# Patient Record
Sex: Male | Born: 1935 | Race: White | Hispanic: No | Smoking: Never smoker
Health system: Southern US, Community
[De-identification: ages and names within clinical notes are randomized; demographics above are authoritative.]

## PROBLEM LIST (undated history)

## (undated) DIAGNOSIS — R0602 Shortness of breath: Secondary | ICD-10-CM

## (undated) DIAGNOSIS — F329 Major depressive disorder, single episode, unspecified: Secondary | ICD-10-CM

## (undated) DIAGNOSIS — K5730 Diverticulosis of large intestine without perforation or abscess without bleeding: Secondary | ICD-10-CM

## (undated) DIAGNOSIS — I639 Cerebral infarction, unspecified: Secondary | ICD-10-CM

## (undated) DIAGNOSIS — E785 Hyperlipidemia, unspecified: Secondary | ICD-10-CM

## (undated) DIAGNOSIS — I679 Cerebrovascular disease, unspecified: Secondary | ICD-10-CM

## (undated) DIAGNOSIS — I480 Paroxysmal atrial fibrillation: Secondary | ICD-10-CM

## (undated) DIAGNOSIS — Z8619 Personal history of other infectious and parasitic diseases: Secondary | ICD-10-CM

## (undated) DIAGNOSIS — C4490 Unspecified malignant neoplasm of skin, unspecified: Secondary | ICD-10-CM

## (undated) DIAGNOSIS — I82409 Acute embolism and thrombosis of unspecified deep veins of unspecified lower extremity: Secondary | ICD-10-CM

## (undated) DIAGNOSIS — I4510 Unspecified right bundle-branch block: Secondary | ICD-10-CM

## (undated) DIAGNOSIS — I2699 Other pulmonary embolism without acute cor pulmonale: Secondary | ICD-10-CM

## (undated) DIAGNOSIS — J309 Allergic rhinitis, unspecified: Secondary | ICD-10-CM

## (undated) DIAGNOSIS — C61 Malignant neoplasm of prostate: Secondary | ICD-10-CM

## (undated) DIAGNOSIS — I350 Nonrheumatic aortic (valve) stenosis: Secondary | ICD-10-CM

## (undated) DIAGNOSIS — IMO0002 Reserved for concepts with insufficient information to code with codable children: Secondary | ICD-10-CM

## (undated) DIAGNOSIS — M1990 Unspecified osteoarthritis, unspecified site: Secondary | ICD-10-CM

## (undated) DIAGNOSIS — R011 Cardiac murmur, unspecified: Secondary | ICD-10-CM

## (undated) DIAGNOSIS — D126 Benign neoplasm of colon, unspecified: Secondary | ICD-10-CM

## (undated) DIAGNOSIS — J42 Unspecified chronic bronchitis: Secondary | ICD-10-CM

## (undated) DIAGNOSIS — I10 Essential (primary) hypertension: Secondary | ICD-10-CM

## (undated) DIAGNOSIS — F32A Depression, unspecified: Secondary | ICD-10-CM

## (undated) DIAGNOSIS — K589 Irritable bowel syndrome without diarrhea: Secondary | ICD-10-CM

## (undated) DIAGNOSIS — I872 Venous insufficiency (chronic) (peripheral): Secondary | ICD-10-CM

## (undated) DIAGNOSIS — J189 Pneumonia, unspecified organism: Secondary | ICD-10-CM

## (undated) DIAGNOSIS — G609 Hereditary and idiopathic neuropathy, unspecified: Secondary | ICD-10-CM

## (undated) DIAGNOSIS — K219 Gastro-esophageal reflux disease without esophagitis: Secondary | ICD-10-CM

## (undated) DIAGNOSIS — I739 Peripheral vascular disease, unspecified: Secondary | ICD-10-CM

## (undated) DIAGNOSIS — I6350 Cerebral infarction due to unspecified occlusion or stenosis of unspecified cerebral artery: Secondary | ICD-10-CM

## (undated) HISTORY — DX: Essential (primary) hypertension: I10

## (undated) HISTORY — PX: VENA CAVA FILTER PLACEMENT: SHX1085

## (undated) HISTORY — PX: CAROTID ENDARTERECTOMY: SUR193

## (undated) HISTORY — DX: Malignant neoplasm of prostate: C61

## (undated) HISTORY — DX: Hereditary and idiopathic neuropathy, unspecified: G60.9

## (undated) HISTORY — DX: Benign neoplasm of colon, unspecified: D12.6

## (undated) HISTORY — DX: Venous insufficiency (chronic) (peripheral): I87.2

## (undated) HISTORY — PX: SKIN CANCER EXCISION: SHX779

## (undated) HISTORY — PX: ABDOMINAL AORTIC ANEURYSM REPAIR: SUR1152

## (undated) HISTORY — DX: Nonrheumatic aortic (valve) stenosis: I35.0

## (undated) HISTORY — DX: Allergic rhinitis, unspecified: J30.9

## (undated) HISTORY — DX: Hyperlipidemia, unspecified: E78.5

## (undated) HISTORY — DX: Other pulmonary embolism without acute cor pulmonale: I26.99

## (undated) HISTORY — DX: Peripheral vascular disease, unspecified: I73.9

## (undated) HISTORY — DX: Irritable bowel syndrome, unspecified: K58.9

## (undated) HISTORY — DX: Cerebrovascular disease, unspecified: I67.9

## (undated) HISTORY — DX: Major depressive disorder, single episode, unspecified: F32.9

## (undated) HISTORY — DX: Paroxysmal atrial fibrillation: I48.0

## (undated) HISTORY — DX: Gastro-esophageal reflux disease without esophagitis: K21.9

## (undated) HISTORY — DX: Cerebral infarction due to unspecified occlusion or stenosis of unspecified cerebral artery: I63.50

## (undated) HISTORY — DX: Acute embolism and thrombosis of unspecified deep veins of unspecified lower extremity: I82.409

## (undated) HISTORY — DX: Diverticulosis of large intestine without perforation or abscess without bleeding: K57.30

## (undated) HISTORY — PX: CATARACT EXTRACTION W/ INTRAOCULAR LENS  IMPLANT, BILATERAL: SHX1307

## (undated) HISTORY — DX: Depression, unspecified: F32.A

## (undated) HISTORY — DX: Unspecified chronic bronchitis: J42

## (undated) HISTORY — DX: Unspecified osteoarthritis, unspecified site: M19.90

## (undated) HISTORY — PX: TONSILLECTOMY AND ADENOIDECTOMY: SUR1326

## (undated) HISTORY — PX: PROSTATECTOMY: SHX69

---
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
## 1989-02-07 HISTORY — PX: INGUINAL HERNIA REPAIR: SUR1180

## 1997-12-08 ENCOUNTER — Encounter: Payer: Self-pay | Admitting: *Deleted

## 1997-12-08 ENCOUNTER — Ambulatory Visit (HOSPITAL_COMMUNITY): Admission: RE | Admit: 1997-12-08 | Discharge: 1997-12-08 | Payer: Self-pay | Admitting: *Deleted

## 1997-12-17 ENCOUNTER — Encounter: Payer: Self-pay | Admitting: *Deleted

## 1997-12-17 ENCOUNTER — Inpatient Hospital Stay (HOSPITAL_COMMUNITY): Admission: AD | Admit: 1997-12-17 | Discharge: 1997-12-27 | Payer: Self-pay | Admitting: *Deleted

## 1997-12-19 ENCOUNTER — Encounter: Payer: Self-pay | Admitting: Pulmonary Disease

## 1998-10-01 ENCOUNTER — Encounter: Payer: Self-pay | Admitting: Vascular Surgery

## 1998-10-01 ENCOUNTER — Ambulatory Visit (HOSPITAL_COMMUNITY): Admission: RE | Admit: 1998-10-01 | Discharge: 1998-10-01 | Payer: Self-pay | Admitting: Vascular Surgery

## 1998-10-08 ENCOUNTER — Inpatient Hospital Stay (HOSPITAL_COMMUNITY): Admission: RE | Admit: 1998-10-08 | Discharge: 1998-10-24 | Payer: Self-pay | Admitting: Vascular Surgery

## 1998-10-08 ENCOUNTER — Encounter: Payer: Self-pay | Admitting: Vascular Surgery

## 1998-10-08 ENCOUNTER — Encounter (INDEPENDENT_AMBULATORY_CARE_PROVIDER_SITE_OTHER): Payer: Self-pay | Admitting: Specialist

## 1998-10-08 ENCOUNTER — Encounter: Payer: Self-pay | Admitting: Anesthesiology

## 1998-10-09 ENCOUNTER — Encounter: Payer: Self-pay | Admitting: Pulmonary Disease

## 1998-10-09 ENCOUNTER — Encounter: Payer: Self-pay | Admitting: Vascular Surgery

## 1998-10-10 ENCOUNTER — Encounter: Payer: Self-pay | Admitting: Thoracic Surgery

## 1998-10-11 ENCOUNTER — Encounter: Payer: Self-pay | Admitting: Thoracic Surgery

## 1998-10-12 ENCOUNTER — Encounter: Payer: Self-pay | Admitting: Thoracic Surgery

## 1998-10-13 ENCOUNTER — Encounter: Payer: Self-pay | Admitting: Thoracic Surgery

## 1998-10-14 ENCOUNTER — Encounter: Payer: Self-pay | Admitting: Vascular Surgery

## 1998-10-15 ENCOUNTER — Encounter: Payer: Self-pay | Admitting: Pulmonary Disease

## 1998-10-16 ENCOUNTER — Encounter: Payer: Self-pay | Admitting: Pulmonary Disease

## 1998-10-17 ENCOUNTER — Encounter: Payer: Self-pay | Admitting: Vascular Surgery

## 1998-10-20 ENCOUNTER — Encounter: Payer: Self-pay | Admitting: Vascular Surgery

## 1998-10-22 ENCOUNTER — Encounter: Payer: Self-pay | Admitting: Vascular Surgery

## 1998-10-23 ENCOUNTER — Encounter: Payer: Self-pay | Admitting: Vascular Surgery

## 1999-07-19 ENCOUNTER — Encounter: Payer: Self-pay | Admitting: Vascular Surgery

## 1999-07-19 ENCOUNTER — Ambulatory Visit (HOSPITAL_COMMUNITY): Admission: RE | Admit: 1999-07-19 | Discharge: 1999-07-19 | Payer: Self-pay | Admitting: Vascular Surgery

## 1999-08-24 ENCOUNTER — Encounter: Payer: Self-pay | Admitting: Vascular Surgery

## 1999-08-25 ENCOUNTER — Encounter (INDEPENDENT_AMBULATORY_CARE_PROVIDER_SITE_OTHER): Payer: Self-pay

## 1999-08-25 ENCOUNTER — Inpatient Hospital Stay: Admission: RE | Admit: 1999-08-25 | Discharge: 1999-08-26 | Payer: Self-pay | Admitting: Vascular Surgery

## 2000-01-04 ENCOUNTER — Emergency Department (HOSPITAL_COMMUNITY): Admission: EM | Admit: 2000-01-04 | Discharge: 2000-01-05 | Payer: Self-pay | Admitting: Emergency Medicine

## 2000-07-16 ENCOUNTER — Encounter: Payer: Self-pay | Admitting: Emergency Medicine

## 2000-07-16 ENCOUNTER — Emergency Department (HOSPITAL_COMMUNITY): Admission: EM | Admit: 2000-07-16 | Discharge: 2000-07-16 | Payer: Self-pay | Admitting: Emergency Medicine

## 2001-01-25 ENCOUNTER — Encounter: Payer: Self-pay | Admitting: Pulmonary Disease

## 2001-01-25 ENCOUNTER — Ambulatory Visit (HOSPITAL_COMMUNITY): Admission: RE | Admit: 2001-01-25 | Discharge: 2001-01-25 | Payer: Self-pay | Admitting: Pulmonary Disease

## 2001-04-16 ENCOUNTER — Encounter: Admission: RE | Admit: 2001-04-16 | Discharge: 2001-05-10 | Payer: Self-pay | Admitting: Neurological Surgery

## 2002-03-14 ENCOUNTER — Encounter: Payer: Self-pay | Admitting: Pulmonary Disease

## 2002-03-14 ENCOUNTER — Encounter: Payer: Self-pay | Admitting: Emergency Medicine

## 2002-03-14 ENCOUNTER — Inpatient Hospital Stay (HOSPITAL_COMMUNITY): Admission: EM | Admit: 2002-03-14 | Discharge: 2002-03-18 | Payer: Self-pay | Admitting: Emergency Medicine

## 2002-04-08 ENCOUNTER — Encounter: Payer: Self-pay | Admitting: Ophthalmology

## 2002-04-09 ENCOUNTER — Ambulatory Visit (HOSPITAL_COMMUNITY): Admission: RE | Admit: 2002-04-09 | Discharge: 2002-04-09 | Payer: Self-pay | Admitting: Ophthalmology

## 2002-06-14 ENCOUNTER — Ambulatory Visit (HOSPITAL_COMMUNITY): Admission: RE | Admit: 2002-06-14 | Discharge: 2002-06-14 | Payer: Self-pay | Admitting: Ophthalmology

## 2002-09-08 ENCOUNTER — Encounter: Payer: Self-pay | Admitting: Neurology

## 2002-09-08 ENCOUNTER — Inpatient Hospital Stay (HOSPITAL_COMMUNITY): Admission: EM | Admit: 2002-09-08 | Discharge: 2002-09-10 | Payer: Self-pay

## 2002-09-08 DIAGNOSIS — I639 Cerebral infarction, unspecified: Secondary | ICD-10-CM

## 2002-09-08 HISTORY — DX: Cerebral infarction, unspecified: I63.9

## 2002-09-09 ENCOUNTER — Encounter: Payer: Self-pay | Admitting: Internal Medicine

## 2002-09-10 ENCOUNTER — Inpatient Hospital Stay (HOSPITAL_COMMUNITY)
Admission: RE | Admit: 2002-09-10 | Discharge: 2002-09-24 | Payer: Self-pay | Admitting: Physical Medicine & Rehabilitation

## 2002-09-17 ENCOUNTER — Encounter: Payer: Self-pay | Admitting: Pulmonary Disease

## 2002-09-18 ENCOUNTER — Encounter: Payer: Self-pay | Admitting: Physical Medicine & Rehabilitation

## 2002-09-23 ENCOUNTER — Encounter: Payer: Self-pay | Admitting: Physical Medicine & Rehabilitation

## 2002-10-09 DIAGNOSIS — I679 Cerebrovascular disease, unspecified: Secondary | ICD-10-CM

## 2002-10-09 HISTORY — DX: Cerebrovascular disease, unspecified: I67.9

## 2002-10-23 ENCOUNTER — Ambulatory Visit
Admission: RE | Admit: 2002-10-23 | Discharge: 2002-10-23 | Payer: Self-pay | Admitting: Physical Medicine & Rehabilitation

## 2002-10-23 ENCOUNTER — Encounter: Payer: Self-pay | Admitting: Physical Medicine & Rehabilitation

## 2002-10-23 ENCOUNTER — Ambulatory Visit (HOSPITAL_COMMUNITY)
Admission: RE | Admit: 2002-10-23 | Discharge: 2002-10-23 | Payer: Self-pay | Admitting: Physical Medicine & Rehabilitation

## 2002-10-29 ENCOUNTER — Encounter
Admission: RE | Admit: 2002-10-29 | Discharge: 2003-01-27 | Payer: Self-pay | Admitting: Physical Medicine & Rehabilitation

## 2002-11-01 ENCOUNTER — Encounter
Admission: RE | Admit: 2002-11-01 | Discharge: 2002-12-27 | Payer: Self-pay | Admitting: Physical Medicine & Rehabilitation

## 2002-11-20 ENCOUNTER — Encounter: Payer: Self-pay | Admitting: Physical Medicine & Rehabilitation

## 2002-11-20 ENCOUNTER — Ambulatory Visit (HOSPITAL_COMMUNITY)
Admission: RE | Admit: 2002-11-20 | Discharge: 2002-11-20 | Payer: Self-pay | Admitting: Physical Medicine & Rehabilitation

## 2003-01-22 ENCOUNTER — Ambulatory Visit (HOSPITAL_COMMUNITY)
Admission: RE | Admit: 2003-01-22 | Discharge: 2003-01-22 | Payer: Self-pay | Admitting: Physical Medicine & Rehabilitation

## 2003-03-04 ENCOUNTER — Encounter
Admission: RE | Admit: 2003-03-04 | Discharge: 2003-06-02 | Payer: Self-pay | Admitting: Physical Medicine & Rehabilitation

## 2003-11-14 ENCOUNTER — Emergency Department (HOSPITAL_COMMUNITY): Admission: EM | Admit: 2003-11-14 | Discharge: 2003-11-14 | Payer: Self-pay | Admitting: Emergency Medicine

## 2003-12-12 ENCOUNTER — Ambulatory Visit: Payer: Self-pay | Admitting: Internal Medicine

## 2003-12-17 ENCOUNTER — Ambulatory Visit: Payer: Self-pay | Admitting: Pulmonary Disease

## 2004-01-02 ENCOUNTER — Ambulatory Visit: Payer: Self-pay | Admitting: Internal Medicine

## 2004-01-16 ENCOUNTER — Ambulatory Visit: Payer: Self-pay | Admitting: Internal Medicine

## 2004-01-16 ENCOUNTER — Ambulatory Visit: Payer: Self-pay | Admitting: Pulmonary Disease

## 2004-02-06 ENCOUNTER — Ambulatory Visit: Payer: Self-pay | Admitting: Internal Medicine

## 2004-03-05 ENCOUNTER — Ambulatory Visit: Payer: Self-pay | Admitting: Cardiology

## 2004-04-02 ENCOUNTER — Ambulatory Visit: Payer: Self-pay | Admitting: Cardiology

## 2004-04-30 ENCOUNTER — Ambulatory Visit: Payer: Self-pay | Admitting: Cardiology

## 2004-05-17 ENCOUNTER — Ambulatory Visit: Payer: Self-pay | Admitting: Pulmonary Disease

## 2004-05-18 ENCOUNTER — Ambulatory Visit: Payer: Self-pay | Admitting: Pulmonary Disease

## 2004-05-28 ENCOUNTER — Ambulatory Visit: Payer: Self-pay | Admitting: Cardiology

## 2004-06-08 ENCOUNTER — Ambulatory Visit: Payer: Self-pay | Admitting: Cardiovascular Disease

## 2004-06-16 ENCOUNTER — Ambulatory Visit: Payer: Self-pay | Admitting: Cardiovascular Disease

## 2004-06-16 ENCOUNTER — Ambulatory Visit: Payer: Self-pay

## 2004-06-25 ENCOUNTER — Ambulatory Visit: Payer: Self-pay | Admitting: Cardiovascular Disease

## 2004-07-23 ENCOUNTER — Ambulatory Visit: Payer: Self-pay | Admitting: Cardiology

## 2004-08-20 ENCOUNTER — Ambulatory Visit: Payer: Self-pay | Admitting: Cardiology

## 2004-09-14 ENCOUNTER — Ambulatory Visit: Payer: Self-pay | Admitting: Pulmonary Disease

## 2004-09-17 ENCOUNTER — Ambulatory Visit: Payer: Self-pay | Admitting: Cardiology

## 2004-10-01 ENCOUNTER — Ambulatory Visit: Payer: Self-pay | Admitting: Internal Medicine

## 2004-10-29 ENCOUNTER — Ambulatory Visit: Payer: Self-pay | Admitting: Cardiology

## 2004-11-29 ENCOUNTER — Ambulatory Visit: Payer: Self-pay | Admitting: Cardiology

## 2004-12-13 ENCOUNTER — Ambulatory Visit: Payer: Self-pay | Admitting: Internal Medicine

## 2005-01-07 ENCOUNTER — Ambulatory Visit: Payer: Self-pay | Admitting: Cardiovascular Disease

## 2005-01-12 ENCOUNTER — Ambulatory Visit: Payer: Self-pay | Admitting: Pulmonary Disease

## 2005-02-09 ENCOUNTER — Ambulatory Visit: Payer: Self-pay | Admitting: Cardiology

## 2005-03-01 ENCOUNTER — Ambulatory Visit: Payer: Self-pay | Admitting: *Deleted

## 2005-03-22 ENCOUNTER — Ambulatory Visit: Payer: Self-pay | Admitting: *Deleted

## 2005-04-19 ENCOUNTER — Ambulatory Visit: Payer: Self-pay | Admitting: *Deleted

## 2005-05-03 ENCOUNTER — Ambulatory Visit: Payer: Self-pay | Admitting: Cardiology

## 2005-05-24 ENCOUNTER — Ambulatory Visit: Payer: Self-pay | Admitting: Cardiology

## 2005-06-21 ENCOUNTER — Ambulatory Visit: Payer: Self-pay | Admitting: Cardiology

## 2005-07-13 ENCOUNTER — Ambulatory Visit: Payer: Self-pay | Admitting: Pulmonary Disease

## 2005-07-19 ENCOUNTER — Ambulatory Visit: Payer: Self-pay | Admitting: *Deleted

## 2005-08-16 ENCOUNTER — Ambulatory Visit: Payer: Self-pay | Admitting: *Deleted

## 2005-08-24 ENCOUNTER — Ambulatory Visit: Payer: Self-pay | Admitting: Pulmonary Disease

## 2005-09-13 ENCOUNTER — Ambulatory Visit: Payer: Self-pay | Admitting: Cardiology

## 2005-10-11 ENCOUNTER — Ambulatory Visit: Payer: Self-pay | Admitting: Cardiovascular Disease

## 2005-11-07 ENCOUNTER — Ambulatory Visit: Payer: Self-pay | Admitting: Cardiology

## 2005-11-29 ENCOUNTER — Ambulatory Visit: Payer: Self-pay | Admitting: Pulmonary Disease

## 2005-12-05 ENCOUNTER — Ambulatory Visit: Payer: Self-pay | Admitting: Cardiology

## 2006-01-02 ENCOUNTER — Ambulatory Visit: Payer: Self-pay | Admitting: Pulmonary Disease

## 2006-01-03 ENCOUNTER — Ambulatory Visit: Payer: Self-pay | Admitting: Cardiology

## 2006-02-02 ENCOUNTER — Ambulatory Visit: Payer: Self-pay | Admitting: Cardiology

## 2006-02-09 ENCOUNTER — Ambulatory Visit: Payer: Self-pay | Admitting: Cardiovascular Disease

## 2006-03-02 ENCOUNTER — Ambulatory Visit: Payer: Self-pay | Admitting: Cardiology

## 2006-03-23 ENCOUNTER — Ambulatory Visit: Payer: Self-pay | Admitting: Cardiology

## 2006-04-20 ENCOUNTER — Ambulatory Visit: Payer: Self-pay | Admitting: Cardiology

## 2006-05-01 ENCOUNTER — Ambulatory Visit: Payer: Self-pay | Admitting: Cardiology

## 2006-05-22 ENCOUNTER — Ambulatory Visit: Payer: Self-pay | Admitting: Cardiology

## 2006-06-12 ENCOUNTER — Ambulatory Visit: Payer: Self-pay | Admitting: Cardiovascular Disease

## 2006-06-16 ENCOUNTER — Ambulatory Visit: Payer: Self-pay

## 2006-06-26 ENCOUNTER — Ambulatory Visit: Payer: Self-pay | Admitting: Pulmonary Disease

## 2006-06-27 LAB — CONVERTED CEMR LAB
AST: 33 units/L (ref 0–37)
Alkaline Phosphatase: 120 units/L — ABNORMAL HIGH (ref 39–117)
BUN: 24 mg/dL — ABNORMAL HIGH (ref 6–23)
Basophils Relative: 1.3 % — ABNORMAL HIGH (ref 0.0–1.0)
CO2: 28 meq/L (ref 19–32)
Chloride: 106 meq/L (ref 96–112)
Creatinine, Ser: 1 mg/dL (ref 0.4–1.5)
HCT: 50.4 % (ref 39.0–52.0)
Hemoglobin: 17.2 g/dL — ABNORMAL HIGH (ref 13.0–17.0)
LDL Cholesterol: 65 mg/dL (ref 0–99)
Monocytes Absolute: 1.4 10*3/uL — ABNORMAL HIGH (ref 0.2–0.7)
Monocytes Relative: 13.6 % — ABNORMAL HIGH (ref 3.0–11.0)
Neutrophils Relative %: 44.6 % (ref 43.0–77.0)
Potassium: 4.8 meq/L (ref 3.5–5.1)
RDW: 13.4 % (ref 11.5–14.6)
TSH: 3.14 microintl units/mL (ref 0.35–5.50)
Total Bilirubin: 1.1 mg/dL (ref 0.3–1.2)
Total Protein: 6.1 g/dL (ref 6.0–8.3)
VLDL: 20 mg/dL (ref 0–40)

## 2006-07-10 ENCOUNTER — Ambulatory Visit: Payer: Self-pay | Admitting: Cardiovascular Disease

## 2006-07-31 ENCOUNTER — Ambulatory Visit: Payer: Self-pay | Admitting: Cardiovascular Disease

## 2006-08-17 ENCOUNTER — Ambulatory Visit: Payer: Self-pay | Admitting: Cardiovascular Disease

## 2006-08-28 ENCOUNTER — Ambulatory Visit: Payer: Self-pay | Admitting: Cardiology

## 2006-09-25 ENCOUNTER — Ambulatory Visit: Payer: Self-pay | Admitting: Cardiology

## 2006-10-11 ENCOUNTER — Ambulatory Visit: Payer: Self-pay | Admitting: Internal Medicine

## 2006-10-31 ENCOUNTER — Ambulatory Visit: Payer: Self-pay | Admitting: Cardiology

## 2006-11-28 ENCOUNTER — Ambulatory Visit: Payer: Self-pay | Admitting: Cardiovascular Disease

## 2006-12-25 ENCOUNTER — Ambulatory Visit: Payer: Self-pay | Admitting: Pulmonary Disease

## 2006-12-26 ENCOUNTER — Ambulatory Visit: Payer: Self-pay | Admitting: Cardiology

## 2007-01-23 ENCOUNTER — Ambulatory Visit: Payer: Self-pay | Admitting: Internal Medicine

## 2007-02-09 ENCOUNTER — Telehealth (INDEPENDENT_AMBULATORY_CARE_PROVIDER_SITE_OTHER): Payer: Self-pay | Admitting: *Deleted

## 2007-02-12 DIAGNOSIS — M1990 Unspecified osteoarthritis, unspecified site: Secondary | ICD-10-CM | POA: Insufficient documentation

## 2007-02-12 DIAGNOSIS — I10 Essential (primary) hypertension: Secondary | ICD-10-CM | POA: Insufficient documentation

## 2007-02-12 DIAGNOSIS — C61 Malignant neoplasm of prostate: Secondary | ICD-10-CM

## 2007-02-12 DIAGNOSIS — E785 Hyperlipidemia, unspecified: Secondary | ICD-10-CM | POA: Insufficient documentation

## 2007-02-12 DIAGNOSIS — K219 Gastro-esophageal reflux disease without esophagitis: Secondary | ICD-10-CM

## 2007-02-20 ENCOUNTER — Ambulatory Visit: Payer: Self-pay | Admitting: Cardiovascular Disease

## 2007-03-04 ENCOUNTER — Emergency Department (HOSPITAL_COMMUNITY): Admission: EM | Admit: 2007-03-04 | Discharge: 2007-03-04 | Payer: Self-pay | Admitting: Emergency Medicine

## 2007-03-20 ENCOUNTER — Ambulatory Visit: Payer: Self-pay | Admitting: Cardiovascular Disease

## 2007-03-20 LAB — CONVERTED CEMR LAB
CO2: 30 meq/L (ref 19–32)
Creatinine, Ser: 1 mg/dL (ref 0.4–1.5)
Glucose, Bld: 100 mg/dL — ABNORMAL HIGH (ref 70–99)
Potassium: 4.8 meq/L (ref 3.5–5.1)
Sodium: 140 meq/L (ref 135–145)

## 2007-04-17 ENCOUNTER — Ambulatory Visit: Payer: Self-pay | Admitting: Cardiology

## 2007-05-14 ENCOUNTER — Ambulatory Visit: Payer: Self-pay | Admitting: Cardiovascular Disease

## 2007-06-12 ENCOUNTER — Ambulatory Visit: Payer: Self-pay

## 2007-06-12 ENCOUNTER — Encounter: Payer: Self-pay | Admitting: Pulmonary Disease

## 2007-06-12 ENCOUNTER — Ambulatory Visit: Payer: Self-pay | Admitting: Cardiology

## 2007-06-26 ENCOUNTER — Ambulatory Visit: Payer: Self-pay | Admitting: Pulmonary Disease

## 2007-06-26 DIAGNOSIS — I679 Cerebrovascular disease, unspecified: Secondary | ICD-10-CM

## 2007-06-26 DIAGNOSIS — G609 Hereditary and idiopathic neuropathy, unspecified: Secondary | ICD-10-CM | POA: Insufficient documentation

## 2007-06-26 DIAGNOSIS — I2699 Other pulmonary embolism without acute cor pulmonale: Secondary | ICD-10-CM

## 2007-06-26 DIAGNOSIS — I6350 Cerebral infarction due to unspecified occlusion or stenosis of unspecified cerebral artery: Secondary | ICD-10-CM | POA: Insufficient documentation

## 2007-07-01 DIAGNOSIS — K5730 Diverticulosis of large intestine without perforation or abscess without bleeding: Secondary | ICD-10-CM | POA: Insufficient documentation

## 2007-07-01 DIAGNOSIS — I739 Peripheral vascular disease, unspecified: Secondary | ICD-10-CM

## 2007-07-01 DIAGNOSIS — J309 Allergic rhinitis, unspecified: Secondary | ICD-10-CM | POA: Insufficient documentation

## 2007-07-01 DIAGNOSIS — I872 Venous insufficiency (chronic) (peripheral): Secondary | ICD-10-CM | POA: Insufficient documentation

## 2007-07-01 DIAGNOSIS — D126 Benign neoplasm of colon, unspecified: Secondary | ICD-10-CM

## 2007-07-01 LAB — CONVERTED CEMR LAB
ALT: 28 units/L (ref 0–53)
AST: 31 units/L (ref 0–37)
Basophils Relative: 0.6 % (ref 0.0–1.0)
CO2: 29 meq/L (ref 19–32)
Calcium: 9.6 mg/dL (ref 8.4–10.5)
Chloride: 107 meq/L (ref 96–112)
Cholesterol: 120 mg/dL (ref 0–200)
Creatinine, Ser: 1.1 mg/dL (ref 0.4–1.5)
Eosinophils Relative: 2 % (ref 0.0–5.0)
Glucose, Bld: 137 mg/dL — ABNORMAL HIGH (ref 70–99)
Hemoglobin: 16.7 g/dL (ref 13.0–17.0)
LDL Cholesterol: 66 mg/dL (ref 0–99)
Lymphocytes Relative: 39.8 % (ref 12.0–46.0)
Monocytes Relative: 10 % (ref 3.0–12.0)
Neutro Abs: 4.9 10*3/uL (ref 1.4–7.7)
Neutrophils Relative %: 47.6 % (ref 43.0–77.0)
PSA: 0.02 ng/mL — ABNORMAL LOW (ref 0.10–4.00)
RBC: 5.25 M/uL (ref 4.22–5.81)
TSH: 3 microintl units/mL (ref 0.35–5.50)
Total Bilirubin: 1.5 mg/dL — ABNORMAL HIGH (ref 0.3–1.2)
Total CHOL/HDL Ratio: 3.4
Total Protein: 6.2 g/dL (ref 6.0–8.3)
VLDL: 19 mg/dL (ref 0–40)
WBC: 10.5 10*3/uL (ref 4.5–10.5)

## 2007-07-10 ENCOUNTER — Ambulatory Visit: Payer: Self-pay | Admitting: Cardiology

## 2007-08-07 ENCOUNTER — Ambulatory Visit: Payer: Self-pay | Admitting: Cardiology

## 2007-08-28 ENCOUNTER — Telehealth (INDEPENDENT_AMBULATORY_CARE_PROVIDER_SITE_OTHER): Payer: Self-pay | Admitting: *Deleted

## 2007-09-04 ENCOUNTER — Ambulatory Visit: Payer: Self-pay | Admitting: Internal Medicine

## 2007-09-28 ENCOUNTER — Ambulatory Visit: Payer: Self-pay | Admitting: Internal Medicine

## 2007-09-28 ENCOUNTER — Telehealth: Payer: Self-pay | Admitting: Pulmonary Disease

## 2007-09-28 LAB — CONVERTED CEMR LAB
Basophils Absolute: 0.1 10*3/uL (ref 0.0–0.1)
Calcium: 9.5 mg/dL (ref 8.4–10.5)
GFR calc Af Amer: 82 mL/min
GFR calc non Af Amer: 68 mL/min
Glucose, Bld: 110 mg/dL — ABNORMAL HIGH (ref 70–99)
HCT: 49.1 % (ref 39.0–52.0)
Hemoglobin: 16.7 g/dL (ref 13.0–17.0)
MCHC: 34.1 g/dL (ref 30.0–36.0)
Monocytes Absolute: 1.9 10*3/uL — ABNORMAL HIGH (ref 0.1–1.0)
Monocytes Relative: 11.5 % (ref 3.0–12.0)
Neutro Abs: 11.4 10*3/uL — ABNORMAL HIGH (ref 1.4–7.7)
Platelets: 158 10*3/uL (ref 150–400)
Potassium: 4.1 meq/L (ref 3.5–5.1)
Pro B Natriuretic peptide (BNP): 349 pg/mL — ABNORMAL HIGH (ref 0.0–100.0)
RDW: 12.8 % (ref 11.5–14.6)
Sodium: 140 meq/L (ref 135–145)
Uric Acid, Serum: 6.4 mg/dL (ref 4.0–7.8)

## 2007-10-02 ENCOUNTER — Ambulatory Visit: Payer: Self-pay | Admitting: Cardiology

## 2007-10-04 ENCOUNTER — Telehealth: Payer: Self-pay | Admitting: Adult Health

## 2007-10-05 ENCOUNTER — Ambulatory Visit: Payer: Self-pay | Admitting: Pulmonary Disease

## 2007-10-05 ENCOUNTER — Ambulatory Visit: Payer: Self-pay

## 2007-10-09 ENCOUNTER — Ambulatory Visit (HOSPITAL_COMMUNITY): Admission: RE | Admit: 2007-10-09 | Discharge: 2007-10-09 | Payer: Self-pay | Admitting: Pulmonary Disease

## 2007-10-19 ENCOUNTER — Ambulatory Visit: Payer: Self-pay | Admitting: Internal Medicine

## 2007-10-30 ENCOUNTER — Ambulatory Visit: Payer: Self-pay | Admitting: Cardiology

## 2007-11-13 ENCOUNTER — Ambulatory Visit: Payer: Self-pay | Admitting: Cardiovascular Disease

## 2007-12-10 ENCOUNTER — Telehealth: Payer: Self-pay | Admitting: Pulmonary Disease

## 2007-12-11 ENCOUNTER — Ambulatory Visit: Payer: Self-pay | Admitting: Internal Medicine

## 2007-12-27 ENCOUNTER — Ambulatory Visit: Payer: Self-pay | Admitting: Pulmonary Disease

## 2008-01-08 ENCOUNTER — Telehealth: Payer: Self-pay | Admitting: Pulmonary Disease

## 2008-01-08 ENCOUNTER — Ambulatory Visit: Payer: Self-pay | Admitting: Cardiovascular Disease

## 2008-01-15 ENCOUNTER — Encounter (INDEPENDENT_AMBULATORY_CARE_PROVIDER_SITE_OTHER): Payer: Self-pay | Admitting: *Deleted

## 2008-02-12 ENCOUNTER — Ambulatory Visit: Payer: Self-pay | Admitting: Internal Medicine

## 2008-02-26 ENCOUNTER — Telehealth: Payer: Self-pay | Admitting: Pulmonary Disease

## 2008-02-28 ENCOUNTER — Encounter: Payer: Self-pay | Admitting: Pulmonary Disease

## 2008-03-04 ENCOUNTER — Ambulatory Visit: Payer: Self-pay | Admitting: Cardiology

## 2008-03-20 ENCOUNTER — Telehealth (INDEPENDENT_AMBULATORY_CARE_PROVIDER_SITE_OTHER): Payer: Self-pay | Admitting: *Deleted

## 2008-03-26 ENCOUNTER — Telehealth (INDEPENDENT_AMBULATORY_CARE_PROVIDER_SITE_OTHER): Payer: Self-pay | Admitting: *Deleted

## 2008-04-01 ENCOUNTER — Ambulatory Visit: Payer: Self-pay | Admitting: Cardiovascular Disease

## 2008-04-23 ENCOUNTER — Ambulatory Visit: Payer: Self-pay | Admitting: Pulmonary Disease

## 2008-04-28 ENCOUNTER — Ambulatory Visit: Payer: Self-pay | Admitting: Cardiology

## 2008-05-26 ENCOUNTER — Ambulatory Visit: Payer: Self-pay | Admitting: Cardiology

## 2008-06-20 ENCOUNTER — Ambulatory Visit: Payer: Self-pay

## 2008-06-20 ENCOUNTER — Ambulatory Visit: Payer: Self-pay | Admitting: Cardiology

## 2008-06-20 ENCOUNTER — Encounter: Payer: Self-pay | Admitting: Cardiovascular Disease

## 2008-06-24 ENCOUNTER — Telehealth: Payer: Self-pay | Admitting: Pulmonary Disease

## 2008-06-25 ENCOUNTER — Ambulatory Visit: Payer: Self-pay | Admitting: Pulmonary Disease

## 2008-06-25 DIAGNOSIS — J209 Acute bronchitis, unspecified: Secondary | ICD-10-CM

## 2008-06-25 DIAGNOSIS — F341 Dysthymic disorder: Secondary | ICD-10-CM

## 2008-06-27 ENCOUNTER — Telehealth (INDEPENDENT_AMBULATORY_CARE_PROVIDER_SITE_OTHER): Payer: Self-pay | Admitting: *Deleted

## 2008-06-30 ENCOUNTER — Telehealth (INDEPENDENT_AMBULATORY_CARE_PROVIDER_SITE_OTHER): Payer: Self-pay | Admitting: *Deleted

## 2008-07-05 DIAGNOSIS — Z9889 Other specified postprocedural states: Secondary | ICD-10-CM

## 2008-07-05 DIAGNOSIS — Z9849 Cataract extraction status, unspecified eye: Secondary | ICD-10-CM

## 2008-07-05 DIAGNOSIS — I509 Heart failure, unspecified: Secondary | ICD-10-CM | POA: Insufficient documentation

## 2008-07-08 ENCOUNTER — Encounter: Payer: Self-pay | Admitting: *Deleted

## 2008-07-08 ENCOUNTER — Telehealth (INDEPENDENT_AMBULATORY_CARE_PROVIDER_SITE_OTHER): Payer: Self-pay | Admitting: *Deleted

## 2008-07-11 ENCOUNTER — Ambulatory Visit: Payer: Self-pay | Admitting: Internal Medicine

## 2008-07-11 LAB — CONVERTED CEMR LAB: POC INR: 2.7

## 2008-08-08 ENCOUNTER — Ambulatory Visit: Payer: Self-pay | Admitting: Cardiology

## 2008-08-13 ENCOUNTER — Encounter: Payer: Self-pay | Admitting: *Deleted

## 2008-09-05 ENCOUNTER — Ambulatory Visit: Payer: Self-pay | Admitting: Cardiology

## 2008-10-03 ENCOUNTER — Encounter: Payer: Self-pay | Admitting: Cardiovascular Disease

## 2008-10-03 LAB — CONVERTED CEMR LAB: POC INR: 2.2

## 2008-10-09 ENCOUNTER — Encounter: Payer: Self-pay | Admitting: Pulmonary Disease

## 2008-10-31 ENCOUNTER — Ambulatory Visit: Payer: Self-pay | Admitting: Internal Medicine

## 2008-10-31 LAB — CONVERTED CEMR LAB: POC INR: 2.2

## 2008-11-28 ENCOUNTER — Ambulatory Visit: Payer: Self-pay | Admitting: Pulmonary Disease

## 2008-12-01 ENCOUNTER — Ambulatory Visit: Payer: Self-pay | Admitting: Cardiology

## 2008-12-01 ENCOUNTER — Ambulatory Visit: Payer: Self-pay | Admitting: Pulmonary Disease

## 2008-12-07 LAB — CONVERTED CEMR LAB
HDL: 46.5 mg/dL (ref >39.00)
Total CHOL/HDL Ratio: 3
VLDL: 19.2 mg/dL (ref 0.0–40.0)

## 2008-12-08 LAB — CONVERTED CEMR LAB
ALT: 25 units/L (ref 0–53)
AST: 31 units/L (ref 0–37)
BUN: 17 mg/dL (ref 6–23)
Basophils Relative: 0.7 % (ref 0.0–3.0)
Chloride: 105 meq/L (ref 96–112)
Eosinophils Relative: 2.1 % (ref 0.0–5.0)
GFR calc non Af Amer: 75.59 mL/min (ref >60)
HCT: 50.9 % (ref 39.0–52.0)
Hemoglobin: 17.1 g/dL — ABNORMAL HIGH (ref 13.0–17.0)
Lymphs Abs: 3.6 10*3/uL (ref 0.7–4.0)
MCV: 99.6 fL (ref 78.0–100.0)
Monocytes Relative: 12.6 % — ABNORMAL HIGH (ref 3.0–12.0)
PSA: 0.03 ng/mL — ABNORMAL LOW (ref 0.10–4.00)
Platelets: 144 10*3/uL — ABNORMAL LOW (ref 150.0–400.0)
Potassium: 4.7 meq/L (ref 3.5–5.1)
RBC: 5.11 M/uL (ref 4.22–5.81)
Sodium: 142 meq/L (ref 135–145)
TSH: 2.97 microintl units/mL (ref 0.35–5.50)
Total Bilirubin: 1.5 mg/dL — ABNORMAL HIGH (ref 0.3–1.2)
Total Protein: 6.7 g/dL (ref 6.0–8.3)
WBC: 10.7 10*3/uL — ABNORMAL HIGH (ref 4.5–10.5)

## 2008-12-28 ENCOUNTER — Telehealth: Payer: Self-pay | Admitting: Pulmonary Disease

## 2008-12-29 ENCOUNTER — Ambulatory Visit: Payer: Self-pay | Admitting: Cardiovascular Disease

## 2008-12-29 LAB — CONVERTED CEMR LAB: POC INR: 3.2

## 2008-12-31 ENCOUNTER — Ambulatory Visit: Payer: Self-pay | Admitting: Pulmonary Disease

## 2009-01-02 ENCOUNTER — Telehealth (INDEPENDENT_AMBULATORY_CARE_PROVIDER_SITE_OTHER): Payer: Self-pay | Admitting: *Deleted

## 2009-01-20 ENCOUNTER — Encounter: Payer: Self-pay | Admitting: Pulmonary Disease

## 2009-01-23 ENCOUNTER — Ambulatory Visit: Payer: Self-pay | Admitting: Cardiovascular Disease

## 2009-01-23 LAB — CONVERTED CEMR LAB: POC INR: 2.2

## 2009-02-20 ENCOUNTER — Ambulatory Visit: Payer: Self-pay | Admitting: Cardiology

## 2009-03-18 ENCOUNTER — Telehealth (INDEPENDENT_AMBULATORY_CARE_PROVIDER_SITE_OTHER): Payer: Self-pay | Admitting: *Deleted

## 2009-03-18 ENCOUNTER — Inpatient Hospital Stay (HOSPITAL_COMMUNITY): Admission: EM | Admit: 2009-03-18 | Discharge: 2009-03-26 | Payer: Self-pay | Admitting: Emergency Medicine

## 2009-04-01 ENCOUNTER — Encounter: Payer: Self-pay | Admitting: Internal Medicine

## 2009-04-14 ENCOUNTER — Encounter: Payer: Self-pay | Admitting: Pulmonary Disease

## 2009-04-15 ENCOUNTER — Telehealth (INDEPENDENT_AMBULATORY_CARE_PROVIDER_SITE_OTHER): Payer: Self-pay | Admitting: *Deleted

## 2009-04-16 ENCOUNTER — Ambulatory Visit: Payer: Self-pay | Admitting: Pulmonary Disease

## 2009-04-28 ENCOUNTER — Encounter: Payer: Self-pay | Admitting: Cardiovascular Disease

## 2009-04-28 ENCOUNTER — Telehealth (INDEPENDENT_AMBULATORY_CARE_PROVIDER_SITE_OTHER): Payer: Self-pay | Admitting: Cardiology

## 2009-04-28 LAB — CONVERTED CEMR LAB: Prothrombin Time: 22.7 s

## 2009-04-29 ENCOUNTER — Encounter: Payer: Self-pay | Admitting: Cardiovascular Disease

## 2009-05-01 ENCOUNTER — Telehealth: Payer: Self-pay | Admitting: Pulmonary Disease

## 2009-05-08 ENCOUNTER — Ambulatory Visit: Payer: Self-pay | Admitting: Cardiovascular Disease

## 2009-05-20 ENCOUNTER — Encounter (INDEPENDENT_AMBULATORY_CARE_PROVIDER_SITE_OTHER): Payer: Self-pay | Admitting: *Deleted

## 2009-06-01 ENCOUNTER — Ambulatory Visit: Payer: Self-pay | Admitting: Cardiology

## 2009-06-15 ENCOUNTER — Ambulatory Visit: Payer: Self-pay | Admitting: Internal Medicine

## 2009-06-15 LAB — CONVERTED CEMR LAB: POC INR: 1.6

## 2009-06-29 ENCOUNTER — Ambulatory Visit: Payer: Self-pay | Admitting: Cardiology

## 2009-06-29 LAB — CONVERTED CEMR LAB: POC INR: 1.9

## 2009-07-17 ENCOUNTER — Ambulatory Visit: Payer: Self-pay | Admitting: Cardiovascular Disease

## 2009-07-17 ENCOUNTER — Ambulatory Visit: Payer: Self-pay | Admitting: Cardiology

## 2009-07-17 DIAGNOSIS — R609 Edema, unspecified: Secondary | ICD-10-CM | POA: Insufficient documentation

## 2009-07-31 ENCOUNTER — Ambulatory Visit: Payer: Self-pay | Admitting: Cardiology

## 2009-07-31 LAB — CONVERTED CEMR LAB: POC INR: 2.2

## 2009-08-14 ENCOUNTER — Ambulatory Visit: Payer: Self-pay | Admitting: Pulmonary Disease

## 2009-08-15 DIAGNOSIS — K589 Irritable bowel syndrome without diarrhea: Secondary | ICD-10-CM

## 2009-08-15 DIAGNOSIS — J42 Unspecified chronic bronchitis: Secondary | ICD-10-CM

## 2009-08-15 LAB — CONVERTED CEMR LAB
Alkaline Phosphatase: 122 units/L — ABNORMAL HIGH (ref 39–117)
Basophils Absolute: 0.1 10*3/uL (ref 0.0–0.1)
Bilirubin, Direct: 0.3 mg/dL (ref 0.0–0.3)
Calcium: 9.8 mg/dL (ref 8.4–10.5)
Eosinophils Absolute: 0.2 10*3/uL (ref 0.0–0.7)
GFR calc non Af Amer: 61.74 mL/min (ref >60)
HCT: 46.6 % (ref 39.0–52.0)
Hemoglobin: 15.8 g/dL (ref 13.0–17.0)
Lymphs Abs: 3 10*3/uL (ref 0.7–4.0)
MCHC: 33.9 g/dL (ref 30.0–36.0)
MCV: 97.2 fL (ref 78.0–100.0)
Monocytes Relative: 11.2 % (ref 3.0–12.0)
Neutro Abs: 4.8 10*3/uL (ref 1.4–7.7)
Pro B Natriuretic peptide (BNP): 97.4 pg/mL (ref 0.0–100.0)
RDW: 14 % (ref 11.5–14.6)
Sodium: 142 meq/L (ref 135–145)
TSH: 3.19 microintl units/mL (ref 0.35–5.50)
Total Bilirubin: 1.2 mg/dL (ref 0.3–1.2)
VLDL: 16.4 mg/dL (ref 0.0–40.0)

## 2009-08-20 ENCOUNTER — Telehealth: Payer: Self-pay | Admitting: Cardiovascular Disease

## 2009-08-24 ENCOUNTER — Ambulatory Visit: Payer: Self-pay

## 2009-08-24 ENCOUNTER — Ambulatory Visit: Payer: Self-pay | Admitting: Cardiovascular Disease

## 2009-09-07 ENCOUNTER — Ambulatory Visit: Payer: Self-pay | Admitting: Cardiovascular Disease

## 2009-09-09 ENCOUNTER — Encounter: Payer: Self-pay | Admitting: Pulmonary Disease

## 2009-09-09 ENCOUNTER — Ambulatory Visit: Payer: Self-pay | Admitting: Vascular Surgery

## 2009-09-15 ENCOUNTER — Encounter: Payer: Self-pay | Admitting: Pulmonary Disease

## 2009-09-23 ENCOUNTER — Ambulatory Visit: Payer: Self-pay | Admitting: Cardiovascular Disease

## 2009-10-23 ENCOUNTER — Ambulatory Visit: Payer: Self-pay | Admitting: Internal Medicine

## 2009-11-23 ENCOUNTER — Ambulatory Visit: Payer: Self-pay | Admitting: Cardiology

## 2009-12-09 ENCOUNTER — Telehealth: Payer: Self-pay | Admitting: Pulmonary Disease

## 2009-12-11 ENCOUNTER — Telehealth: Payer: Self-pay | Admitting: Pulmonary Disease

## 2009-12-23 ENCOUNTER — Observation Stay (HOSPITAL_COMMUNITY): Admission: EM | Admit: 2009-12-23 | Discharge: 2009-12-24 | Payer: Self-pay | Admitting: Emergency Medicine

## 2009-12-25 ENCOUNTER — Telehealth (INDEPENDENT_AMBULATORY_CARE_PROVIDER_SITE_OTHER): Payer: Self-pay | Admitting: *Deleted

## 2009-12-28 ENCOUNTER — Telehealth (INDEPENDENT_AMBULATORY_CARE_PROVIDER_SITE_OTHER): Payer: Self-pay | Admitting: *Deleted

## 2009-12-28 ENCOUNTER — Ambulatory Visit: Payer: Self-pay | Admitting: Cardiovascular Disease

## 2009-12-28 LAB — CONVERTED CEMR LAB: POC INR: 3.8

## 2009-12-29 ENCOUNTER — Telehealth: Payer: Self-pay | Admitting: Cardiovascular Disease

## 2009-12-30 ENCOUNTER — Telehealth: Payer: Self-pay | Admitting: Cardiovascular Disease

## 2009-12-30 ENCOUNTER — Telehealth: Payer: Self-pay | Admitting: Pulmonary Disease

## 2010-01-04 ENCOUNTER — Ambulatory Visit: Payer: Self-pay | Admitting: Cardiology

## 2010-01-04 ENCOUNTER — Inpatient Hospital Stay (HOSPITAL_COMMUNITY)
Admission: EM | Admit: 2010-01-04 | Discharge: 2010-01-11 | Payer: Self-pay | Source: Home / Self Care | Attending: Internal Medicine | Admitting: Internal Medicine

## 2010-01-05 ENCOUNTER — Encounter (INDEPENDENT_AMBULATORY_CARE_PROVIDER_SITE_OTHER): Payer: Self-pay | Admitting: Internal Medicine

## 2010-01-06 ENCOUNTER — Telehealth: Payer: Self-pay | Admitting: Pulmonary Disease

## 2010-01-18 ENCOUNTER — Encounter: Payer: Self-pay | Admitting: Pulmonary Disease

## 2010-01-25 ENCOUNTER — Encounter: Payer: Self-pay | Admitting: Internal Medicine

## 2010-01-26 ENCOUNTER — Encounter: Payer: Self-pay | Admitting: Internal Medicine

## 2010-01-26 ENCOUNTER — Telehealth: Payer: Self-pay | Admitting: Pulmonary Disease

## 2010-02-02 ENCOUNTER — Encounter: Payer: Self-pay | Admitting: Internal Medicine

## 2010-02-02 ENCOUNTER — Ambulatory Visit
Admission: RE | Admit: 2010-02-02 | Discharge: 2010-02-02 | Payer: Self-pay | Source: Home / Self Care | Attending: Pulmonary Disease | Admitting: Pulmonary Disease

## 2010-02-02 ENCOUNTER — Ambulatory Visit: Payer: Self-pay | Admitting: Pulmonary Disease

## 2010-02-02 LAB — CONVERTED CEMR LAB
POC INR: 2.2
Prothrombin Time: 18.3 s

## 2010-02-03 LAB — CONVERTED CEMR LAB
ALT: 21 units/L (ref 0–53)
AST: 26 units/L (ref 0–37)
Alkaline Phosphatase: 103 units/L (ref 39–117)
Basophils Absolute: 0 10*3/uL (ref 0.0–0.1)
Basophils Relative: 0.4 % (ref 0.0–3.0)
Bilirubin, Direct: 0.3 mg/dL (ref 0.0–0.3)
CO2: 24 meq/L (ref 19–32)
Chloride: 101 meq/L (ref 96–112)
Creatinine, Ser: 1.2 mg/dL (ref 0.4–1.5)
Eosinophils Absolute: 0 10*3/uL (ref 0.0–0.7)
Lymphocytes Relative: 13.5 % (ref 12.0–46.0)
MCHC: 34 g/dL (ref 30.0–36.0)
MCV: 97 fL (ref 78.0–100.0)
Monocytes Absolute: 0.5 10*3/uL (ref 0.1–1.0)
Neutrophils Relative %: 81.5 % — ABNORMAL HIGH (ref 43.0–77.0)
Platelets: 190 10*3/uL (ref 150.0–400.0)
Potassium: 5.1 meq/L (ref 3.5–5.1)
RDW: 14.2 % (ref 11.5–14.6)
Sed Rate: 9 mm/hr (ref 0–22)
Sodium: 133 meq/L — ABNORMAL LOW (ref 135–145)
Total Bilirubin: 1 mg/dL (ref 0.3–1.2)
Total Protein: 5.4 g/dL — ABNORMAL LOW (ref 6.0–8.3)

## 2010-02-04 ENCOUNTER — Telehealth (INDEPENDENT_AMBULATORY_CARE_PROVIDER_SITE_OTHER): Payer: Self-pay | Admitting: *Deleted

## 2010-02-04 ENCOUNTER — Telehealth: Payer: Self-pay | Admitting: Pulmonary Disease

## 2010-02-16 ENCOUNTER — Encounter: Payer: Self-pay | Admitting: Internal Medicine

## 2010-02-16 LAB — CONVERTED CEMR LAB: Prothrombin Time: 19.4 s

## 2010-02-22 ENCOUNTER — Encounter: Payer: Self-pay | Admitting: Pulmonary Disease

## 2010-02-22 ENCOUNTER — Telehealth: Payer: Self-pay | Admitting: Pulmonary Disease

## 2010-03-07 ENCOUNTER — Encounter: Payer: Self-pay | Admitting: Pulmonary Disease

## 2010-03-07 LAB — CONVERTED CEMR LAB: POC INR: 2.4

## 2010-03-09 NOTE — Medication Information (Signed)
Summary: rov/mw  Anticoagulant Therapy  Managed by: Weston Brass, PharmD Referring MD: Charlton Haws MD Supervising MD: Daleen Squibb MD, Maisie Fus Indication 1: Deep Vein Thrombosis - Leg (ICD-451.1) Indication 2: Pulmonary Embolism and Infarction (ICD-415.1) Lab Used: [facility] [place] Site: [address] INR POC 2.3   INR RANGE 2 - 3  Dietary changes: no    Health status changes: no    Bleeding/hemorrhagic complications: no    Recent/future hospitalizations: yes       Details: Had right eye brushed last week.    Any changes in medication regimen? yes       Details: Was on amoxicillin 10/10-10/13, and still on hydrocortisone   Recent/future dental: no  Any missed doses?: no       Is patient compliant with meds? yes       Allergies: 1)  Amoxicillin  Anticoagulation Management History:      The patient is taking warfarin and comes in today for a routine follow up visit.  Positive risk factors for bleeding include an age of 75 years or older and history of CVA/TIA.  The bleeding index is 'intermediate risk'.  Positive CHADS2 values include History of CHF, History of HTN, Age > 8 years old, and Prior Stroke/CVA/TIA.  The start date was 12/22/1997.  Anticoagulation responsible provider: Daleen Squibb MD, Maisie Fus.  INR POC: 2.3  .  Cuvette Lot#: [id number].  Exp: 12/2010.    Anticoagulation Management Assessment/Plan:      The patient's current anticoagulation dose is Coumadin 5 mg  tabs: take as directed.  The target INR is 2.0-3.0.  The next INR is due 12/25/2009.  Anticoagulation instructions were given to patient.  Results were reviewed/authorized by Weston Brass, PharmD.  He was notified by Haynes Hoehn, PharmD Candidate.         Prior Anticoagulation Instructions: INR 2.4  Continue taking 1 tablet everyday. We'll see you in 4 weeks.  Current Anticoagulation Instructions: INR 2.3   Continue Coumadin 1 tablet every day of the week.  Return to clinic in 4 weeks.

## 2010-03-09 NOTE — Progress Notes (Signed)
Summary: rt foot pain  Phone Note Call from Patient Call back at Home Phone [phone]   Caller: Patient Call For: nadel Reason for Call: Talk to Doctor Summary of Call: Patient has pain in right foot.  He said it started in hip and leg, pain now in foot.  Patient has lost sleep pain is so bad, he has been taking tramadol from an old rx; requesting to speak to nurse. Initial call taken by: Lehman Prom,  December 09, 2009 9:47 AM  Follow-up for Phone Call        called and spoke with pt and he stated that the pain started in his hip and then on monday this pain was in his foot---lots of pain---has some tramadol from 2009 that he has been using but he stated that the pain med helps some but  has noticed that he is having some increase weakness, afraid he is going to fall, unable to sleep ---said he was treated by TP back in 2009 for the same thing and she gave him the cephelaxin and tramadol for this.  please advise.  thanks Randell Loop CMA  December 09, 2009 10:23 AM   Additional Follow-up for Phone Call Additional follow up Details #1::        per SN----add sterapred dosepak  5 mg---6 day pack  start today---take as directed with no refills. thanks Randell Loop CMA  December 09, 2009 2:21 PM   pt advised of recs and rx sent. Carron Curie CMA  December 09, 2009 2:23 PM     New/Updated Medications: PREDNISONE (PAK) 5 MG TABS (PREDNISONE) 6 day dose pack as directed Prescriptions: PREDNISONE (PAK) 5 MG TABS (PREDNISONE) 6 day dose pack as directed  #1 pak x 0   Entered by:   Carron Curie CMA   Authorized by:   Michele Mcalpine MD   Signed by:   Carron Curie CMA on 12/09/2009   Method used:   Electronically to        [facility] [phone]* (retail)       [address] [place], [place]  [address]       Ph: [phone] or [phone]       Fax: [phone]   RxID:   [id number]

## 2010-03-09 NOTE — Progress Notes (Signed)
Summary: Request from [facility] Financial/Attending Physicians Statement  Request from [facility] for completion of an Attending Physicians Statement. Request forwarded to [employer]. Wilder Glade  March 18, 2009 12:52 PM

## 2010-03-09 NOTE — Progress Notes (Signed)
Summary: medication directions  Phone Note Call from Patient   Caller: Patient Call For: nadel Summary of Call: questions about presdisone directions. [facility] [address] Initial call taken by: Rickard Patience,  December 28, 2009 11:19 AM  Follow-up for Phone Call        Pt went to the ER on  12-23-09 due to severe pain in his legs. He was prescribed prednisone 60mg  daily x 5 days and was advised to call PCP on how to taper prednisone form there. ALso pt was prescribed oxycodone which he staets is helping his pain but makes him feel like he is in  "[place]." Pt wants to know sheould he continue on the oxycodone every 6 hours or should he just wait to take it when he has pain becuase of the way it makes him feel.. Please advise on prednisone and oxycodone. Carron Curie CMA  December 28, 2009 11:39 AM   Additional Follow-up for Phone Call Additional follow up Details #1::        per SN---cont the pred  taper 20mg   #25     1 two times a day x 3 days, 1 daily x 3 days, 1/2 once daily until return ov---will need to be seen 11-30 at 10:30 with SN and decrease oxycodone to 1/2 tab every 6 hours as needed.   thanks Randell Loop CMA  December 28, 2009 2:25 PM     Additional Follow-up for Phone Call Additional follow up Details #2::    Spoke with pt and notified of the above recs per SN.  Pt verbalized understanding.  He states that he is unable to come in for appt on 01/06/10, but he already had one scheduled for 01/08/10.  Is it okay for him to keep this appt or does he need to be seen sooner? Pls advise thanks  New/Updated Medications: PREDNISONE 20 MG TABS (PREDNISONE) 1 two times a day x 3 days, 1 once daily x 3 days, 1/2 daily until seen. Prescriptions: PREDNISONE 20 MG TABS (PREDNISONE) 1 two times a day x 3 days, 1 once daily x 3 days, 1/2 daily until seen.  #25 x 0   Entered by:   Vernie Murders   Authorized by:   Michele Mcalpine MD   Signed by:   Vernie Murders on 12/28/2009  Method used:   Electronically to        [facility] [phone]* (retail)       [address] [place], [place]  [address]       Ph: [phone] or [phone]       Fax: [phone]   RxID:   [id number]

## 2010-03-09 NOTE — Consult Note (Signed)
Summary: [facility]  [facility]   Imported By: Sherian Rein 10/13/2009 09:25:12  _____________________________________________________________________  External Attachment:    Type:   Image     Comment:   External Document

## 2010-03-09 NOTE — Progress Notes (Signed)
Summary: medication issue  Phone Note Call from Patient Call back at Home Phone [phone]   Caller: Patient Call For: nadel Summary of Call: Pt states that prednisone is making his vision blurry pls advise. Initial call taken by: Darletta Moll,  December 11, 2009 4:40 PM  Follow-up for Phone Call        Pt staets he ahs been having eye trouble and he went to [facility] on 11-16-09 and had his eye brushed to get it clear. he staets his vision has been blurry but improving since then. He is set to go get new glasses next week. Pt states today he was watching TV and noticed his vison becoming a little more blurry then it had been and he wonders if it is a side effect of prednisone. Pt wants to continue prednisone if at all possible. I advised I will send message to Sn to advise, but sicne it is late in the day may not get a response, sop Iadvised if anything changes over the weekend to call on call. Pt staets understanding. Please advise.Carron Curie CMA  December 11, 2009 5:17 PM   Additional Follow-up for Phone Call Additional follow up Details #1::        per SN---if he has side effects to this low dose pred then he will need to stop it---he can use the tramadol or advil---explained this to the pt and he stated that the tramadol is worse to use than the prednisone---pt stated that he would like to try to finish the pred pak--he stated that he is unable to take the advil due to the coumdain treatment so he tried the tylenol the other night and this did not help either.  he will cont the pred for now and to see if he can finish.  SN is aware Randell Loop CMA  December 11, 2009 5:36 PM

## 2010-03-09 NOTE — Progress Notes (Signed)
Summary: ? about meds-awaiting fax  Phone Note Call from Patient Call back at Home Phone [phone]   Caller: Patient Call For: nadel Summary of Call: had a pt and inr and he wants to know if there is any change in the coumadin Initial call taken by: Valinda Hoar,  April 28, 2009 1:09 PM  Follow-up for Phone Call        Pt had pt/inr drawn by [facility] nurse and was told to call SN for results. We have not received any results so I called [facility] and they are faxing results over. I will give to SN once received. Carron Curie CMA  April 28, 2009 3:22 PM results given to Ellison Hughs CMA  April 28, 2009 4:18 PM   Additional Follow-up for Phone Call Additional follow up Details #1::        results received and i called and spoke with the pt--he is aware that we will forward the results to the coumadin clinic for them to dose his coumadin.  results are  PT--22.7   INR 2.02.  message will be forwarded to the coumadin clinic. Randell Loop CMA  April 29, 2009 8:49 AM     Additional Follow-up for Phone Call Additional follow up Details #2::    Aware of results.  See coumadin note in EMR. Follow-up by: Cloyde Reams RN,  April 29, 2009 10:54 AM

## 2010-03-09 NOTE — Progress Notes (Signed)
  Phone Note Outgoing Call   Call placed by: Bethena Midget, RN, BSN,  December 25, 2009 9:37 AM Details for Reason: INR follow up  Summary of Call: Pt discharge home from hospital on Prednisone 60mg s daily for 5 days. Telephoned him and he states he didn't go home on Lovenox just coumadin. INR yesterday at discharge 2.8.  Follow up appt s/c for Monday. Initial call taken by: Bethena Midget, RN, BSN,  December 25, 2009 9:39 AM

## 2010-03-09 NOTE — Progress Notes (Signed)
Summary: PA REQUEST CALL   Phone Note From Other Clinic   CallerSu Hoff [phone] Summary of Call: PA REQUEST CALL Initial call taken by: Judie Grieve,  December 30, 2009 2:54 PM  Follow-up for Phone Call        spoke with carol, pt seen by dr ramus and they are wanting to do epidural injections and want to know about pt holding his coumadin for that procedure. will foward for dr Eden Emms review Deliah Goody, RN  December 30, 2009 3:39 PM\par  Additional Follow-up for Phone Call Additional follow up Details #1::        Needs lovenox bridge to stop coumadin.  Has CA with previous DVT, PE , CVA and afib Additional Follow-up by: Colon Branch, MD, [facility],  January 03, 2010 4:52 PM    Additional Follow-up for Phone Call Additional follow up Details #2::    pt here in the coumadin clinic, pt is going to the er for what appears to be cellulitis. spoke with carol at ortho office. she is aware pt will need lovenox bridge befroe procedure and pt states he is not going to do the procedure at a later date. they are aware to contact us 5-7 days prior to the procedure Deliah Goody, RN  January 05, 2010 10:28 AM

## 2010-03-09 NOTE — Progress Notes (Signed)
Summary: [facility] Hilts Attending Physician Statement for completion  [facility] Financial Attending Physician Statement for completion. Document forwarded to [employer]. Dena Chavis  April 15, 2009 8:43 AM

## 2010-03-09 NOTE — Medication Information (Signed)
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 no    Recent/future dental: no  Any missed doses?: no       Is patient compliant with meds? yes       Allergies (verified): 1)  Amoxicillin  Anticoagulation Management History:      The patient is taking warfarin and comes in today for a routine follow up visit.  Positive risk factors for bleeding include an age of 75 years or older and history of CVA/TIA.  The bleeding index is 'intermediate risk'.  Positive CHADS2 values include History of CHF, History of HTN, Age > 1 years old, and Prior Stroke/CVA/TIA.  The start date was 12/22/1997.  Anticoagulation responsible provider: Jens Som MD, Arlys John.  INR POC: 2.4.  Cuvette Lot#: [id number].  Exp: 05/2010.    Anticoagulation Management Assessment/Plan:      The patient's current anticoagulation dose is Coumadin 5 mg  tabs: take as directed.  The target INR is 2.0-3.0.  The next INR is due 03/20/2009.  Anticoagulation instructions were given to patient.  Results were reviewed/authorized by Cloyde Reams, RN, BSN.  He was notified by Ysidro Evert, Pharm D Candidate.         Prior Anticoagulation Instructions: INR 2.2 Continue 5mg s everyday except on Tuesdays and Thursdays take 2.5mg s. Recheck in 4 weeks.   Current Anticoagulation Instructions: INR 2.4 Continue with same dosage of 5mg  tablet daily except 2.5mg  on Tuesdays and Thursdays Recheck in 4 weeks

## 2010-03-09 NOTE — Medication Information (Signed)
Summary: rov/tm  Medications Added OXYCODONE HCL 5 MG TABS (OXYCODONE HCL) take 1 tab every 4-6hr as needed pain PREDNISONE 20 MG TABS (PREDNISONE) taper      Allergies Added:  Anticoagulant Therapy  Managed by: Leota Sauers, PharmD, BCPS, CPP Referring MD: Charlton Haws MD Supervising MD: Excell Seltzer MD, Casimiro Needle Indication 1: Deep Vein Thrombosis - Leg (ICD-451.1) Indication 2: Pulmonary Embolism and Infarction (ICD-415.1) Lab Used: [facility] [place] Site: [address] INR POC 3.8 INR RANGE 2 - 3  Dietary changes: no    Health status changes: yes       Details: hospitalized for severe foot pain  Bleeding/hemorrhagic complications: no    Recent/future hospitalizations: yes       Details: above  Any changes in medication regimen? yes       Details: prednisone 60mg  qd, oxycodone  Recent/future dental: no  Any missed doses?: no       Is patient compliant with meds? yes       Current Medications (verified): 1)  Muro 128 5 % Oint (Sodium Chloride (Hypertonic)) .... Apply To Both Eyes At Bedtime 2)  Allegra 180 Mg  Tabs (Fexofenadine Hcl) .... Once Daily As Needed 3)  Flonase 50 Mcg/act  Susp (Fluticasone Propionate) .... 2 Sprays Each Nostril Once Daily 4)  Advair Diskus 250-50 Mcg/dose  Misc (Fluticasone-Salmeterol) .... Inhale 1 Puff Two Times A Day, Rinse Mouth Well. 5)  Coumadin 5 Mg  Tabs (Warfarin Sodium) .... Take As Directed 6)  Adult Aspirin Low Strength 81 Mg  Tbdp (Aspirin) .... Once Daily 7)  Lisinopril 10 Mg  Tabs (Lisinopril) .... Take 1 Tab By Mouth Once Daily.Marland KitchenMarland Kitchen 8)  Furosemide 40 Mg  Tabs (Furosemide) .... Take 1-2 Tabs By Mouth Once Daily 9)  Klor-Con M20 20 Meq  Tbcr (Potassium Chloride Crys Cr) .... Take 1 Tablet By Mouth Once A Day 10)  Lipitor 20 Mg  Tabs (Atorvastatin Calcium) .... Once Daily 11)  Pepcid Ac 10 Mg  Tabs (Famotidine) .... As Needed 12)  Align  Caps (Probiotic Product) .... Take 1 Cap Daily... 13)  Tums 500 Mg  Chew (Calcium  Carbonate Antacid) .... As Needed 14)  Vitamin D 1000 Unit Tabs (Cholecalciferol) .... Take 1 Cap By Mouth Once Daily.... 15)  Miralax  Powd (Polyethylene Glycol 3350) .... Use As Needed For Constipation.... 16)  Senokot S 8.6-50 Mg Tabs (Sennosides-Docusate Sodium) .... Use As Needed For Constipation... 17)  Imodium Advanced 2-125 Mg Tabs (Loperamide-Simethicone) .... Use As Needed For Diarrhea... 18)  Prednisone (Pak) 5 Mg Tabs (Prednisone) .... 6 Day Dose Pack As Directed 19)  Oxycodone Hcl 5 Mg Tabs (Oxycodone Hcl) .... Take 1 Tab Every 4-6hr As Needed Pain 20)  Prednisone 20 Mg Tabs (Prednisone) .... Taper  Allergies (verified): 1)  Amoxicillin  Anticoagulation Management History:      The patient is taking warfarin and comes in today for a routine follow up visit.  Positive risk factors for bleeding include an age of 25 years or older and history of CVA/TIA.  The bleeding index is 'intermediate risk'.  Positive CHADS2 values include History of CHF, History of HTN, Age > 39 years old, and Prior Stroke/CVA/TIA.  The start date was 12/22/1997.  Anticoagulation responsible Marijo Quizon: Excell Seltzer MD, Casimiro Needle.  INR POC: 3.8.  Cuvette Lot#: [id number].  Exp: 12/2010.    Anticoagulation Management Assessment/Plan:      The patient's current anticoagulation dose is Coumadin 5 mg  tabs: take as directed.  The  target INR is 2.0-3.0.  The next INR is due 01/04/2010.  Anticoagulation instructions were given to patient.  Results were reviewed/authorized by Leota Sauers, PharmD, BCPS, CPP.         Prior Anticoagulation Instructions: INR 2.3   Continue Coumadin 1 tablet every day of the week.  Return to clinic in 4 weeks.   Current Anticoagulation Instructions: INR  NO COUMADIN TODAY MON 11/21 Coumadin 5mg  tabs,  take 1/2 tab until we see you again

## 2010-03-09 NOTE — Assessment & Plan Note (Signed)
Summary: hfu/rsc from 3-4/pt here at 2:30/la   CC:  3-4 month ROV & review of mult medical problems....  History of Present Illness: 77 y/o WM here for a follow up visit... he has mult med problems as noted below...    ~  07-24-2008:  Kathie Rhodes passed away on hospice 2d ago... his daughter is here helping him w/ the arrangements... he has had an upper resp infection w/ cough, yellow sputum, congestion and low grade temp to 100... no chest pain, no SOB, no chills or sweats... CXR showed right basilar scarring, NAD... Rx w/ Avelox, Mucinex, Fluids/ Tylenol...   ~  December 31, 2008:  MrBurske has attendants at home [facility] per week... mult somatic complaints- dizzy, weak, gait abn> & I rec that he decr the Lasix to 1/2 Qd... c/o dark urine & not drinking enough fluids.Marland KitchenMarland Kitchen ?prev gastroenterits w/ low grade temp & abd cramps (IBS like symptoms w/ constip) he tried prunes- but they caused "acid", took 4 Senakot but got "cramps"... we discussed Miralax/ Senakot-S, Align, Activia, & Levsin Prn... also c/o fall w/ bruise on right side of back- improving... decr appetite w/ weight loss, eating  57meals/day, not drinking fluids due to urine incontinence... blurry vision w/ eyes checked at [facility] clinic on gtts... he had the 2010 flu shot in Sept...   ~  April 16, 2009:  he saw [place] for Cards f/u 12/10- Sotolol stopped due to [place] on EKG, he continues on Coumadin thru [facility]- doing satis... he was hosp by [facility] 2/11 for cellulitis left hand ?etiology- treated w/ IV Vanco & Clinda & resolved [employer], XRays & Labs reviewed)... he was disch to [facility] for rehab- & ret home 04/08/09... still weak, has help at home, doing exercises...    Current Problem List:  ALLERGIC RHINITIS (ICD-477.9) - on ALLEGRA 180mg  Prn and FLONASE Qhs...  COPD (ICD-496) - on ADVAIR 250Bid... stable- mild cough/ plhegm/ no change in dyspnea.  Hx of PULMONARY EMBOLISM (ICD-415.19) - he had a DVT w/ PTE in 2001 w/ IVC filter placed... he  remains on COUMADIN w/ regular checks in the Coumadin Clinic- doing well.  HYPERTENSION (ICD-401.9) - on LISINOPRIL 10mg /d, & LASIX 40mg  (1/2 to 1 tab daily) w/ K20/d... BP= 124/84 today, & similar at home... takes meds regularly & tolerating well... he notes some fatigue, intermittent dyspnea & dizziness; but denies HA, visual changes, CP, palipit, syncope, edema, etc...   ~  labs 10/10 showed BUN= 17, Creat= 1.0, K= 4.7  ~  11/10: c/o incr dizzy & weak- rec to decr the Lasix to 1/2 tab daily...  ~  2/11:  hosp records show norm CBC, BMet, TSH, Urine.  PAROXYSMAL ATRIAL FIBRILLATION (ICD-427.31) - prev on Sotalol80 but stopped 12/10 by DrNishan...  remains on Coumadin via clinic... he has been maintaining NSR on his EKG's & by exam...  ~  EKG 12/10 showed SBrady w/ RBBB & 1st degree AVB...  CEREBROVASCULAR DISEASE (ICD-437.9) - on ASA 81mg /d in addition to the Coumadin... he is s/p right carotid endarterectomy 7/01 by Windell Moulding...   ~  MRI 8/04 showed intracranial atherosclerotic changes w/ marked ectasia of V-B sys, sm right vertebral w/ prox stenosis, & >50% left ICA stenosis...   ~  CT Brain 1/09 showed atrophy and chr microvacs ischemic changes & remote IC lacune...  ~  CDoppler 5/09 showed patent right CAE w/ DPA, mild plaque in bulb, 0-39% bilat ICA stenoses- no change...  ~  CDoppler 5/10 showed patent right  CAE w/ DPA, stable mild left carotid dis, 0-39% bilat- f/u 24yr.  PERIPHERAL VASCULAR DISEASE (ICD-443.9) - s/p AAA repair 8/00 & bilat Ao to iliac bypasses in 2001 by DrLawson... he had mult post-op complications and a long hosp course...  VENOUS INSUFFICIENCY, CHRONIC (ICD-459.81) - chr ven insuffic due to his mult DVT etc...  ~  Venous Dopplers right leg 8/09 showed no evid for DVT, no superfic clots, etc...  ~  8/09 developed cellulitis right leg which resolved to Keflex & local care...  HYPERLIPIDEMIA (ICD-272.4) - on LIPITOR 20mg /d & prev FLP at goal on this dose...  ~  FLP  5/08 showed TChol 124, Tg 99, HDL 40, LDL 65  ~  FLP 5/09 showed TChol 120, TG 93, HDL 36, LDL 66  ~  FLP 10/10 showed TChol 123, TG 96, HDL 47, LDL 57  GERD (ICD-530.81) - on PEPCID 20mg /d...  DIVERTICULOSIS OF COLON (ICD-562.10) COLONIC POLYPS (ICD-211.3) - last colonoscopy 8/99 showed divertics, no recurrent polyps... last polyps removed 1996= adenomatous... also had incidental cecal lipoma seen.  Hx of PROSTATE CANCER (ICD-185) - s/p radical prostatectomy in 1994... he is followed by DrWrenn & has urinary incontinence for which he uses pads etc...  ~  labs 10/10 showed PSA= 0.03  DEGENERATIVE JOINT DISEASE (ICD-715.90) - he has a known lumbar disc disease and cervical spondylosis...  Hx of STROKE (ICD-434.91) - he was hospitalized 8/04 w/ stroke and MRI showed acute left pontine infarct + diffuse intracranial atherosclerotic changes- see above- on ASA & Coumadin.  PERIPHERAL NEUROPATHY (ICD-356.9) - he has LBP, neuropathy and a gait abnormality...  ANXIETY DEPRESSION (ICD-300.4) - wife, Kathie Rhodes, died 06-27-22 on hospice (severe pulm fibrosis from scleroderma/ CREST/ etc)...   Allergies: 1)  Amoxicillin  Comments:  Nurse/Medical Assistant: The patient's medications and allergies were reviewed with the patient and were updated in the Medication and Allergy Lists.  Past History:  Past Medical History:  ALLERGIC RHINITIS (ICD-477.9) COPD (ICD-496) Hx of PULMONARY EMBOLISM (ICD-415.19) HYPERTENSION (ICD-401.9) PAROXYSMAL ATRIAL FIBRILLATION (ICD-427.31) CEREBROVASCULAR DISEASE (ICD-437.9) PERIPHERAL VASCULAR DISEASE (ICD-443.9) VENOUS INSUFFICIENCY, CHRONIC (ICD-459.81) HYPERLIPIDEMIA (ICD-272.4) GERD (ICD-530.81) DIVERTICULOSIS OF COLON (ICD-562.10) COLONIC POLYPS (ICD-211.3) Hx of PROSTATE CANCER (ICD-185) DEGENERATIVE JOINT DISEASE (ICD-715.90) Hx of STROKE (ICD-434.91) PERIPHERAL NEUROPATHY (ICD-356.9) ANXIETY DEPRESSION (ICD-300.4)  Past Surgical History:  NEOPLASM,  MALIGNANT, PROSTATE, S/P PROSTATECTOMY, RAD (ICD-V10.46) CATARACT EXTRACTION, HX OF (ICD-V45.61) INGUINAL HERNIORRHAPHIES, BILATERAL, HX OF (ICD-V45.89) CAROTID ENDARTERECTOMY, RIGHT, HX OF (ICD-V15.1) ABDOMINAL AORTIC ANEURYSM REPAIR, HX OF (ICD-V15.1)  Family History: Reviewed history from 06/26/2007 and no changes required. father deceased at age 53 from vessel hemorrhage mother deceased at age 90--broken hip--system shut down  Social History: Reviewed history from 06/25/2008 and no changes required. 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  The patient denies anorexia, fever, weight loss, weight gain, vision loss, hoarseness, chest pain, syncope, peripheral edema, prolonged cough, headaches, hemoptysis, abdominal pain, melena, hematochezia, severe indigestion/heartburn, hematuria, incontinence, suspicious skin lesions, transient blindness, unusual weight change, abnormal bleeding, enlarged lymph nodes, and angioedema.    Vital Signs:  Patient profile:   77 year old male Height:      72 inches Weight:      166.25 pounds O2 Sat:      93 % on Room air  Temp:     97.3 degrees F oral Pulse rate:   46 / minute BP sitting:   124 / 84  (left arm) Cuff size:   regular  Vitals Entered By: Randell Loop CMA (April 16, 2009 2:22 PM)  O2 Sat at Rest %:  93 O2 Flow:  Room air  Physical Exam  Additional Exam:  WD, sl thin, 77 y/o WM in NAD... GENERAL:  Alert & oriented; pleasant & cooperative... HEENT:  [place]/AT, EOM-wnl, PERRLA, EACs-clear, TMs-wnl, NOSE-clear, THROAT-clear & wnl. NECK:  Supple w/ fairROM; no JVD; s/p right CAE, no bruits; no thyromegaly or nodules palpated; no lymphadenopathy. CHEST: decr BS bilat- clear w/o wheezing, rales, ronchi... HEART:  Regular  Rhythm; no m/r/g ABDOMEN:  Soft & nontender; normal bowel sounds; no organomegaly or masses detected. EXT: without deformities, mod arthritic changes; ambulates w/ walker; +venous stasis, no signif edema. NEURO:  CN's intact;  gait abn; no focal neuro deficits,  +generalized weakness... DERM:  No lesions noted; no rash etc...    MISC. Report  Procedure date:  04/16/2009  Findings:      DATA REVIEWED:  ~  prev EMR notes...  ~  [facility] EMR note from 01/23/09, & EKG...  ~  [facility] records 2/9-17/11 including H&P, DC Summary, XRays, Lab.  ~  Avail notes from [facility] rehab adm 2/17 - 04/08/09...   Impression & Recommendations:  Problem # 1:  COPD (ICD-496) Breathing is stable- he is just sl weaker from his ordeal... His updated medication list for this problem includes:    Advair Diskus 250-50 Mcg/dose Misc (Fluticasone-salmeterol) ..... Inhale 1 puff two times a day, rinse mouth well.  Problem # 2:  Hx of PULMONARY EMBOLISM (ICD-415.19) Stable on Coumadin for DVT, PE, PAF... His updated medication list for this problem includes:    Coumadin 5 Mg Tabs (Warfarin sodium) .Marland Kitchen... Take as directed    Adult Aspirin Low Strength 81 Mg Tbdp (Aspirin) ..... Once daily  Problem # 3:  HYPERTENSION (ICD-401.9) Controlled on meds-  keep same. His updated medication list for this problem includes:    Lisinopril 10 Mg Tabs (Lisinopril) .Marland Kitchen... Take 1 tab by mouth once daily...    Furosemide 40 Mg Tabs (Furosemide) .Marland Kitchen... Take 1/2 to 1 tablet by mouth once daily  Problem # 4:  PAROXYSMAL ATRIAL FIBRILLATION (ICD-427.31) Followed by [facility] the [facility]... same meds (he is off the Sotolol now)... His updated medication list for this problem includes:    Coumadin 5 Mg Tabs (Warfarin sodium) .Marland Kitchen... Take as directed    Adult Aspirin Low Strength 81 Mg Tbdp (Aspirin) ..... Once daily  Problem # 5:  CEREBROVASCULAR DISEASE (ICD-437.9) S/P CAE & hx TIA... stable on ASA/ Coumadin... continue  same.  Problem # 6:  HYPERLIPIDEMIA (ICD-272.4) Stable on the Lip20... His updated medication list for this problem includes:    Lipitor 20 Mg Tabs (Atorvastatin calcium) ..... Once daily  Problem # 7:  DIVERTICULOSIS OF COLON (ICD-562.10) GI is stable-  continue same Rx.  Problem # 8:  Hx of PROSTATE CANCER (ICD-185) Stable x for urinary incontinence & followed by DrWrenn... he is just using pads & coping well...  Problem # 9:  ANXIETY DEPRESSION (ICD-300.4) Aware-  he is managing very well considering it's been less than 1 yr since [place] passed away...  Problem # 10:  OTHER PROBLEMS AS NOTED>>> Left hand cellulitis resolved...  Complete Medication List: 1)  Muro 128 5 % Oint (Sodium chloride (hypertonic)) .... Apply to  both eyes at bedtime 2)  Allegra 180 Mg Tabs (Fexofenadine hcl) .... Once daily as needed 3)  Flonase 50 Mcg/act Susp (Fluticasone propionate) .... 2 sprays each nostril once daily 4)  Advair Diskus 250-50 Mcg/dose Misc (Fluticasone-salmeterol) .... Inhale 1 puff two times a day, rinse mouth well. 5)  Coumadin 5 Mg Tabs (Warfarin sodium) .... Take as directed 6)  Adult Aspirin Low Strength 81 Mg Tbdp (Aspirin) .... Once daily 7)  Lisinopril 10 Mg Tabs (Lisinopril) .... Take 1 tab by mouth once daily.Marland KitchenMarland Kitchen 8)  Furosemide 40 Mg Tabs (Furosemide) .... Take 1/2 to 1 tablet by mouth once daily 9)  Klor-con M20 20 Meq Tbcr (Potassium chloride crys cr) .... Take 1 tablet by mouth once a day 10)  Lipitor 20 Mg Tabs (Atorvastatin calcium) .... Once daily 11)  Pepcid Ac 10 Mg Tabs (Famotidine) .... As needed 12)  Miralax Powd (Polyethylene glycol 3350) .Marland Kitchen.. 1 capful in water two times a day... 13)  Senokot S 8.6-50 Mg Tabs (Sennosides-docusate sodium) .... Take 2 tabs by mouth at bedtime... 14)  Align Caps (Probiotic product) .... Take 1 cap daily... 15)  Tums 500 Mg Chew (Calcium carbonate antacid) .... As needed 16)  Vitamin D 1000 Unit Tabs (Cholecalciferol) .... Take 1 cap  by mouth once daily....  Patient Instructions: 1)  Today we updated your med list- see below.... 2)  Continue these meds the same... 3)  Call for any problems.Marland KitchenMarland Kitchen 4)  Keep up the good work w/ your exercise program & rehab... 5)  Please schedule a follow-up appointment in 4 months.

## 2010-03-09 NOTE — Progress Notes (Signed)
Summary: question on medication   Phone Note Call from Patient Call back at Home Phone [phone] Call back at [phone]   Caller: Patient Reason for Call: Talk to Nurse Summary of Call: pt has question on medication re coumadin. Initial call taken by: Roe Coombs,  December 29, 2009 11:27 AM  Follow-up for Phone Call        [name] to see Orthopedic MD on Monday. He states he might receive injections in the future depending on what Ortho MD plans. Informed he to instruct Ortho to contact Cardiology if they need clearance to hold coumadin.  Follow-up by: Bethena Midget, RN, BSN,  December 29, 2009 4:41 PM

## 2010-03-09 NOTE — Assessment & Plan Note (Signed)
Summary: F6M/DM      Allergies Added:   History of Present Illness: Glenn Barron is seen today for F/U of multiple issues.  He had a tough year and his wife passed a few months ago.  He has home care helping a lot now.  He has a history of PAF, TIA, AAA with Aobifem.  He is still seeing the coumadin clinic.  His HTN and lipids are under good control.   He is still depressed about his wifes death.  He needs to have his sotolol stopped He has SSS with relative bradycardia and some wenkebach on ECG. He has some dizzyness but no frank syncope and seems to ambulated marginally with a walker.  He does not want a pacer and currently there is not a firm indication for one  He was in the hospital with a right hand infection recentlyl  He has been having LE pain and swelling I told him to increase his lasix to two times a day as needed.  Since he has had a aortobifem we will check a duplex and ABI's  Current Problems (verified): 1)  Cataract Extraction, Hx of  (ICD-V45.61) 2)  Inguinal Herniorrhaphies, Bilateral, Hx of  (ICD-V45.89) 3)  Carotid Endarterectomy, Right, Hx of  (ICD-V15.1) 4)  Abdominal Aortic Aneurysm Repair, Hx of  (ICD-V15.1) 5)  Congestive Heart Failure  (ICD-428.0) 6)  Acute Bronchitis  (ICD-466.0) 7)  Allergic Rhinitis  (ICD-477.9) 8)  COPD  (ICD-496) 9)  Hx of Pulmonary Embolism  (ICD-415.19) 10)  Hypertension  (ICD-401.9) 11)  Paroxysmal Atrial Fibrillation  (ICD-427.31) 12)  Cerebrovascular Disease  (ICD-437.9) 13)  Peripheral Vascular Disease  (ICD-443.9) 14)  Venous Insufficiency, Chronic  (ICD-459.81) 15)  Dvt, Hx of  (ICD-453.40) 16)  Hyperlipidemia  (ICD-272.4) 17)  Gerd  (ICD-530.81) 18)  Diverticulosis of Colon  (ICD-562.10) 19)  Colonic Polyps  (ICD-211.3) 20)  Hx of Prostate Cancer  (ICD-185) 21)  Degenerative Joint Disease  (ICD-715.90) 22)  Hx of Stroke  (ICD-434.91) 23)  Peripheral Neuropathy  (ICD-356.9) 24)  Anxiety Depression  (ICD-300.4)  Current  Medications (verified): 1)  Muro 128 5 % Oint (Sodium Chloride (Hypertonic)) .... Apply To Both Eyes At Bedtime 2)  Allegra 180 Mg  Tabs (Fexofenadine Hcl) .... Once Daily As Needed 3)  Flonase 50 Mcg/act  Susp (Fluticasone Propionate) .... 2 Sprays Each Nostril Once Daily 4)  Advair Diskus 250-50 Mcg/dose  Misc (Fluticasone-Salmeterol) .... Inhale 1 Puff Two Times A Day, Rinse Mouth Well. 5)  Coumadin 5 Mg  Tabs (Warfarin Sodium) .... Take As Directed 6)  Adult Aspirin Low Strength 81 Mg  Tbdp (Aspirin) .... Once Daily 7)  Lisinopril 10 Mg  Tabs (Lisinopril) .... Take 1 Tab By Mouth Once Daily.Marland KitchenMarland Kitchen 8)  Furosemide 40 Mg  Tabs (Furosemide) .... Take 1/2 To 1 Tablet By Mouth Once Daily 9)  Klor-Con M20 20 Meq  Tbcr (Potassium Chloride Crys Cr) .... Take 1 Tablet By Mouth Once A Day 10)  Lipitor 20 Mg  Tabs (Atorvastatin Calcium) .... Once Daily 11)  Pepcid Ac 10 Mg  Tabs (Famotidine) .... As Needed 12)  Miralax  Powd (Polyethylene Glycol 3350) .Marland Kitchen.. 1 Capful in Water Two Times A Day... 13)  Senokot S 8.6-50 Mg Tabs (Sennosides-Docusate Sodium) .... Take 2 Tabs By Mouth At Bedtime... 14)  Align  Caps (Probiotic Product) .... Take 1 Cap Daily... 15)  Tums 500 Mg  Chew (Calcium Carbonate Antacid) .... As Needed 16)  Vitamin D 1000 Unit Tabs (Cholecalciferol) .Marland KitchenMarland KitchenMarland Kitchen  Take 1 Cap By Mouth Once Daily....  Allergies (verified): 1)  Amoxicillin  Past History:  Past Medical History: Last updated: 04/16/2009  ALLERGIC RHINITIS (ICD-477.9) COPD (ICD-496) Hx of PULMONARY EMBOLISM (ICD-415.19) HYPERTENSION (ICD-401.9) PAROXYSMAL ATRIAL FIBRILLATION (ICD-427.31) CEREBROVASCULAR DISEASE (ICD-437.9) PERIPHERAL VASCULAR DISEASE (ICD-443.9) VENOUS INSUFFICIENCY, CHRONIC (ICD-459.81) HYPERLIPIDEMIA (ICD-272.4) GERD (ICD-530.81) DIVERTICULOSIS OF COLON (ICD-562.10) COLONIC POLYPS (ICD-211.3) Hx of PROSTATE CANCER (ICD-185) DEGENERATIVE JOINT DISEASE (ICD-715.90) Hx of STROKE (ICD-434.91) PERIPHERAL  NEUROPATHY (ICD-356.9) ANXIETY DEPRESSION (ICD-300.4)  Past Surgical History: Last updated: 04/16/2009  NEOPLASM, MALIGNANT, PROSTATE, S/P PROSTATECTOMY, RAD (ICD-V10.46) CATARACT EXTRACTION, HX OF (ICD-V45.61) INGUINAL HERNIORRHAPHIES, BILATERAL, HX OF (ICD-V45.89) CAROTID ENDARTERECTOMY, RIGHT, HX OF (ICD-V15.1) ABDOMINAL AORTIC ANEURYSM REPAIR, HX OF (ICD-V15.1)  Family History: Last updated: Jul 06, 2007 father deceased at age 67 from vessel hemorrhage mother deceased at age 85--broken hip--system shut down  Social History: Last updated: 2008/07/05 Widow- wife Philis Barron died Jun 27, 2022 3 children 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   Vital Signs:  Patient profile:   77 year old male Height:      72 inches Weight:      169 pounds BMI:     23.00 Pulse rate:   64 / minute Pulse rhythm:   regular BP sitting:   106 / 64  (left arm) Cuff size:   regular  Vitals Entered By: Judithe Modest CMA (July 17, 2009 8:55 AM)  Physical Exam  General:  Affect appropriate Healthy:  appears stated age HEENT: normal Neck supple with no adenopathy JVP normal no bruits no thyromegaly Lungs clear with no wheezing and good diaphragmatic motion Heart:  S1/S2 no murmur,rub, gallop or click PMI normal Abdomen: benighn, BS positve, no tenderness, no AAA no bruit.  No HSM or HJR Distal pulses intact with no bruits Plus one bilateral  edema Neuro non-focal Skin warm and dry    Impression & Recommendations:  Problem # 1:  ABDOMINAL AORTIC ANEURYSM REPAIR, HX OF (ICD-V15.1) Check duplex no tenderness on exam Orders: Abdominal Aorta Duplex (Abd Aorta Duplex)  Problem # 2:  HYPERTENSION (ICD-401.9)  Well controlled His updated medication list  for this problem includes:    Adult Aspirin Low Strength 81 Mg Tbdp (Aspirin) ..... Once daily    Lisinopril 10 Mg Tabs (Lisinopril) .Marland Kitchen... Take 1 tab by mouth once daily...    Furosemide 40 Mg Tabs (Furosemide) .Marland Kitchen... Take 1/2 to 1 tablet by mouth once daily  His updated medication list for this problem includes:    Adult Aspirin Low Strength 81 Mg Tbdp (Aspirin) ..... Once daily    Lisinopril 10 Mg Tabs (Lisinopril) .Marland Kitchen... Take 1 tab by mouth once daily...    Furosemide 40 Mg Tabs (Furosemide) .Marland Kitchen... Take 1/2 to 1 tablet by mouth once daily  Problem # 3:  PAROXYSMAL ATRIAL FIBRILLATION (ICD-427.31) With history of PE continue coumadin.  F/U INR in coumadin clinic today His updated medication list for this problem includes:    Coumadin 5 Mg Tabs (Warfarin sodium) .Marland Kitchen... Take as directed    Adult Aspirin Low Strength 81 Mg Tbdp (Aspirin) ..... Once daily  Problem # 4:  PERIPHERAL VASCULAR DISEASE (ICD-443.9) Leg pain with Aobifem history. Trial off statin.  Check ABI's  Other Orders: Arterial Duplex Lower Extremity (Arterial Duplex Low)  Patient Instructions: 1)  Your  physician recommends that you schedule a follow-up appointment in: 6 MONTHS WITH DR Eden Emms 2)  Your physician has requested that you have an abdominal aorta duplex. During this test, an ultrasound is used to evaluate the aorta. Allow 30 minutes for this exam. Do not eat after midnight the day before and avoid carbonated beverages. There are no restrictions or special instructions.SCHEDULE MON OR FRI 3)  Your physician has requested that you have an ankle brachial index (ABI). During this test an ultrasound and blood pressure cuff are used to evaluate the arteries that supply the arms and legs with blood. Allow thirty minutes for this exam. There are no restrictions or special instructions. 4)  Your physician has recommended you make the following change in your medication: MAY TAKE AN EXTRA LASIX A DAY AS NEEDED FOR LEG SWELLING

## 2010-03-09 NOTE — Assessment & Plan Note (Signed)
Summary: 4 months/apc   CC:  4 month ROV & review of mult medical problems....  History of Present Illness: 77 y/o WM here for a follow up visit... he has mult med problems as noted below... Followed for general medical purposes w/ hx of recurrent bronchitis & allergies, hx PTE/ DVT/ & IVC filter on Coumadin, HBP, PAF, cerebrovasc dis w/ hx stroke & prev right CAE, ASPVD w/ prev AAA repair & subseq bilat Ao-iliac bypasses, Hyperchol, Prostate cancer, neuropathy, and anxiety/ depression...   ~  2008-07-06:  Kathie Rhodes passed away on hospice 2d ago... his daughter is here helping him w/ the arrangements... he has had an upper resp infection w/ cough, yellow sputum, congestion and low grade temp to 100... no chest pain, no SOB, no chills or sweats... CXR showed right basilar scarring, NAD... Rx w/ Avelox, Mucinex, Fluids/ Tylenol...   ~  December 31, 2008:  MrBurske has attendants at home [facility] per week... mult somatic complaints- dizzy, weak, gait abn> & I rec that he decr the Lasix to 1/2 Qd... c/o dark urine & not drinking enough fluids.Marland KitchenMarland Kitchen ?prev gastroenterits w/ low grade temp & abd cramps (IBS like symptoms w/ constip) he tried prunes- but they caused "acid", took 4 Senakot but got "cramps"... we discussed Miralax/ Senakot-S, Align, Activia, & Levsin Prn... also c/o fall w/ bruise on right side of back- improving... decr appetite w/ weight loss, eating  1meals/day, not drinking fluids due to urine incontinence... blurry vision w/ eyes checked at [facility] clinic on gtts... he had the 2010 flu shot in Sept...   ~  April 16, 2009:  he saw [place] for Cards f/u 12/10- Sotolol stopped due to [place] on EKG, he continues on Coumadin thru [facility]- doing satis... he was hosp by [facility] 2/11 for cellulitis left hand ?etiology- treated w/ IV Vanco & Clinda & resolved [employer], XRays & Labs reviewed)... he was disch to [facility] for rehab- & ret home 04/08/09... still weak, has help at home, doing exercises...    ~  August 14, 2009:  he saw Walker Kehr 6/11- stable, w/ f/u LE Dopplers and CDopplers pending... he is still weak, doing his own exercises at home- offered more PT but he declines... notes some incr swelling since he cut his Lasix- now back to 1 tab daily & tthinks he might need more on occas... we discussed f/u CXR (chr changes, NAD) & LABS (generally stable, NAD) today.   Current Problem List:  ALLERGIC RHINITIS (ICD-477.9) - on ALLEGRA 180mg  Prn and FLONASE Qhs...  BRONCHITIS, RECURRENT (ICD-491.9) - on ADVAIR 250Bid... stable- mild cough/ plhegm/ no change in dyspnea.  ~  CXR 7/11 showed some hyperinflation, no change scarring & vol loss RLL, NAD...  Hx of PULMONARY EMBOLISM (ICD-415.19) - he had a DVT w/ PTE in 2001 w/ IVC filter placed... he remains on COUMADIN w/ regular checks in the Coumadin Clinic- doing well.  HYPERTENSION (ICD-401.9) - on LISINOPRIL 10mg /d, & LASIX 40mg  (now 1-2 tab daily) w/ K20/d... BP= 110/60 today, & similar at home... takes meds regularly & tolerating well... he notes some fatigue, intermittent dyspnea & dizziness; but denies HA, visual changes, CP, palipit, syncope, edema, etc...   ~  labs 10/10 showed BUN= 17, Creat= 1.0, K= 4.7  ~  11/10: c/o incr dizzy & weak- rec to decr the Lasix to 1/2 tab daily...  ~  2/11:  hosp records show norm CBC, BMet, TSH, Urine.  ~  7/11: c/o incr edema & wants  to incr Lasix to 1-2 Qam; BUN=28, Creat=1.2, K=4.2, BNP=97  PAROXYSMAL ATRIAL FIBRILLATION (ICD-427.31) - prev on Sotalol80 but stopped 12/10 by DrNishan...  remains on Coumadin via clinic... he has been maintaining NSR on his EKG's & by exam...  ~  EKG 12/10 showed SBrady w/ RBBB & 1st degree AVB...  CEREBROVASCULAR DISEASE (ICD-437.9) - on ASA 81mg /d in addition to the Coumadin...   ~  he is s/p right carotid endarterectomy 7/01 by Windell Moulding...   ~  MRI 8/04 showed intracranial atherosclerotic changes w/ marked ectasia of V-B sys, sm right vertebral w/ prox stenosis, & >50% left ICA  stenosis...   ~  CT Brain 1/09 showed atrophy and chr microvacs ischemic changes & remote IC lacune...  ~  CDoppler 5/09 showed patent right CAE w/ DPA, mild plaque in bulb, 0-39% bilat ICA stenoses- no change...  ~  CDoppler 5/10 showed patent right CAE w/ DPA, stable mild left carotid dis, 0-39% bilat- f/u 42yr.  PERIPHERAL VASCULAR DISEASE (ICD-443.9) - s/p AAA repair 8/00 & bilat Ao to iliac bypasses in 2001 by DrLawson... he had mult post-op complications and a long hosp course...  VENOUS INSUFFICIENCY, CHRONIC (ICD-459.81) - chr ven insuffic due to his mult DVT etc...  ~  Venous Dopplers right leg 8/09 showed no evid for DVT, no superfic clots, etc...  ~  8/09 developed cellulitis right leg which resolved to Keflex & local care...  HYPERLIPIDEMIA (ICD-272.4) - on LIPITOR 20mg /d & prev FLP at goal on this dose...  ~  FLP 5/08 showed TChol 124, Tg 99, HDL 40, LDL 65  ~  FLP 5/09 showed TChol 120, TG 93, HDL 36, LDL 66  ~  FLP 10/10 showed TChol 123, TG 96, HDL 47, LDL 57  GERD (ICD-530.81) - on PEPCID 20mg /d...  DIVERTICULOSIS OF COLON (ICD-562.10) IRRITABLE BOWEL SYNDROME (ICD-564.1) - he has diarrhea (uses Immodium) & constipation (uses Miralax/ Senakot-S) w/ a tough job regulating (uses ALIGN)... COLONIC POLYPS (ICD-211.3) - last colonoscopy 8/99 showed divertics, no recurrent polyps... last polyps removed 1996= adenomatous... also had incidental cecal lipoma seen.  Hx of PROSTATE CANCER (ICD-185) - s/p radical prostatectomy in 1994... he is followed by DrWrenn & has urinary incontinence for which he uses pads etc...  ~  labs 10/10 showed PSA= 0.03  ~  labs 7/11 showed PSA= 0.04  DEGENERATIVE JOINT DISEASE (ICD-715.90) - he has a known lumbar disc disease and cervical spondylosis... he also takes Vit D 1000 u daily...   ~  labs 7/11 showed Vit D level = 30... continue OTC supplement.  Hx of STROKE (ICD-434.91) - he was hospitalized 8/04 w/ stroke and MRI showed acute left pontine  infarct + diffuse intracranial atherosclerotic changes- see above- on ASA & Coumadin.  PERIPHERAL NEUROPATHY (ICD-356.9) - he has LBP, neuropathy and a gait abnormality...  ANXIETY DEPRESSION (ICD-300.4) - wife, Kathie Rhodes, died July 11, 2022 on hospice (severe pulm fibrosis from scleroderma/ CREST/ etc)...   Preventive Screening-Counseling & Management  Alcohol-Tobacco     Smoking Status: never  Allergies: 1)  Amoxicillin  Comments:  Nurse/Medical Assistant: The patient's medications and allergies were reviewed with the patient and were updated in the Medication and Allergy Lists.  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) HYPERTENSION (ICD-401.9) PAROXYSMAL ATRIAL FIBRILLATION (ICD-427.31) CEREBROVASCULAR DISEASE (ICD-437.9) PERIPHERAL VASCULAR DISEASE (ICD-443.9) VENOUS INSUFFICIENCY, CHRONIC (ICD-459.81) DVT, HX OF (ICD-453.40) HYPERLIPIDEMIA (ICD-272.4) GERD (ICD-530.81) DIVERTICULOSIS OF COLON (ICD-562.10) IRRITABLE BOWEL SYNDROME (ICD-564.1) COLONIC POLYPS (ICD-211.3)  Hx of PROSTATE CANCER (ICD-185) DEGENERATIVE JOINT DISEASE (ICD-715.90) Hx of STROKE (ICD-434.91) PERIPHERAL NEUROPATHY (ICD-356.9) ANXIETY DEPRESSION (ICD-300.4)  Past Surgical History: NEOPLASM, MALIGNANT, PROSTATE, S/P PROSTATECTOMY, RAD (ICD-V10.46) CATARACT EXTRACTION, HX OF (ICD-V45.61) INGUINAL HERNIORRHAPHIES, BILATERAL, HX OF (ICD-V45.89) CAROTID ENDARTERECTOMY, RIGHT, HX OF (ICD-V15.1) ABDOMINAL AORTIC ANEURYSM REPAIR, HX OF (ICD-V15.1)  Family History: Reviewed history from 06/26/2007 and no changes required. father deceased at age 74 from vessel hemorrhage mother deceased at age 57--broken hip--system shut down  Social History: Reviewed history from 06/25/2008 and no changes required. Widow- wife [name] died 07/06/22 3 children pt is a chemist--retired non-smoker quit drinking 10 years  ago  Review of Systems      See HPI       The patient complains of dyspnea on exertion, peripheral edema, muscle weakness, and difficulty walking.  The patient denies anorexia, fever, weight loss, weight gain, vision loss, decreased hearing, hoarseness, chest pain, syncope, prolonged cough, headaches, hemoptysis, abdominal pain, melena, hematochezia, severe indigestion/heartburn, hematuria, incontinence, suspicious skin lesions, transient blindness, depression, unusual weight change, abnormal bleeding, enlarged lymph nodes, and angioedema.    Vital Signs:  Patient profile:   77 year old male Height:      72 inches Weight:      168 pounds BMI:     22.87 O2 Sat:      98 % on Room air Temp:     98.0 degrees F oral Pulse rate:   61 / minute BP sitting:   110 / 60  (left arm) Cuff size:   regular  Vitals Entered By: Randell Loop CMA (August 14, 2009 9:55 AM)  O2 Sat at Rest %:  98 O2 Flow:  Room air CC: 4 month ROV & review of mult medical problems... Is Patient Diabetic? No Pain Assessment Patient in pain? yes      Onset of pain  leg  pain Comments meds updated today   Physical Exam  Additional Exam:  WD, sl thin, 77 y/o WM in NAD... GENERAL:  Alert & oriented; pleasant & cooperative... HEENT:  [place]/AT, EOM-wnl, PERRLA, EACs-clear, TMs-wnl, NOSE-clear, THROAT-clear & wnl. NECK:  Supple w/ fairROM; no JVD; s/p right CAE, no bruits; no thyromegaly or nodules palpated; no lymphadenopathy. CHEST: decr BS bilat- clear w/o wheezing, rales, ronchi... HEART:  Regular Rhythm; w/o murmur, rubs, or gallop heard... ABDOMEN:  Soft & nontender; normal bowel sounds; no organomegaly or masses detected. EXT: without deformities, mod arthritic changes; ambulates w/ walker; +venous stasis, no signif edema. NEURO:  CN's intact;  gait abn; no focal neuro deficits,  +generalized weakness... DERM:  No lesions noted; no rash etc...    CXR  Procedure date:  08/14/2009  Findings:      CHEST - 2  VIEW Comparison: 06/25/2008   Findings: Hyperinflation suggests COPD. There is mild convex right thoracic spine curvature.  Volume loss in the right hemithorax. No pleural effusion or pneumothorax. There is an artifactual density over the right lung apex. Similar appearance of volume loss/scar in the right lower lobe.   IMPRESSION:   1.  COPD without acute cardiopulmonary disease. 2.  Similar appearance of volume loss/scar at the right lung base.   Read By:  Consuello Bossier,  M.D.    MISC. Report  Procedure date:  08/14/2009  Findings:      BMP (METABOL)   Sodium                    142 mEq/L  135-145   Potassium                 4.2 mEq/L                   3.5-5.1   Chloride                  108 mEq/L                   96-112   Carbon Dioxide            28 mEq/L                    19-32   Glucose                   89 mg/dL                    16-10   BUN                  [H]  28 mg/dL                    9-60   Creatinine                1.2 mg/dL                   4.5-4.0   Calcium                   9.8 mg/dL                   9.8-11.9   GFR                       61.74 mL/min                >60   Hepatic/Liver Function Panel (HEPATIC)   Total Bilirubin           1.2 mg/dL                   1.4-7.8   Direct Bilirubin          0.3 mg/dL                   2.9-5.6   Alkaline Phosphatase [H]  122 U/L                     39-117   AST                       27 U/L                      0-37   ALT                       24 U/L                      0-53   Total Protein             6.4 g/dL                    2.1-3.0   Albumin                   3.7 g/dL  3.5-5.2  CBC Platelet w/Diff (CBCD)   White Cell Count          9.2 K/uL                    4.5-10.5   Red Cell Count            4.79 Mil/uL                 4.22-5.81   Hemoglobin                15.8 g/dL                   16.1-09.6   Hematocrit                46.6 %                      39.0-52.0    MCV                       97.2 fl                     78.0-100.0   Platelet Count       [L]  144.0 K/uL                  150.0-400.0   Neutrophil %              52.5 %                      43.0-77.0   Lymphocyte %              32.8 %                      12.0-46.0   Monocyte %                11.2 %                      3.0-12.0   Eosinophils%              2.7 %                       0.0-5.0   Basophils %               0.8 %                       0.0-3.0  Comments:      Lipid Panel (LIPID)   Cholesterol               129 mg/dL                   0-454   Triglycerides             82.0 mg/dL                  0.9-811.9   HDL                       14.78 mg/dL                 >29.56   LDL Cholesterol           61 mg/dL  0-99  TSH (TSH)   FastTSH                   3.19 uIU/mL                 0.35-5.50  B-Type Natiuretic Peptide (BNPR)  B-Type Natriuetic Peptide                             97.4 pg/mL                  0.0-100.0  Prostate Specific Antigen (PSA)   PSA-Hyb              [L]  0.04 ng/mL                  0.10-4.00  Vitamin D (25-Hydroxy) (40347)  Vitamin D (25-Hydroxy)                             30 ng/mL                    30-89   Impression & Recommendations:  Problem # 1:  BRONCHITIS, RECURRENT (ICD-491.9) Resp status is stable...  Problem # 2:  Hx of PULMONARY EMBOLISM (ICD-415.19) He had hx DVT/ PTE> on Coumadinfor this & PAF... stable. His updated medication list for this problem includes:    Coumadin 5 Mg Tabs (Warfarin sodium) .Marland Kitchen... Take as directed    Adult Aspirin Low Strength 81 Mg Tbdp (Aspirin) ..... Once daily  Problem # 3:  HYPERTENSION (ICD-401.9) Controlled>  same meds. His updated medication list for this problem includes:    Lisinopril 10 Mg Tabs (Lisinopril) .Marland Kitchen... Take 1 tab by mouth once daily...    Furosemide 40 Mg Tabs (Furosemide) .Marland Kitchen... Take 1-2 tabs by mouth once daily  Orders: TLB-BMP (Basic Metabolic Panel-BMET)  (80048-METABOL) TLB-Hepatic/Liver Function Pnl (80076-HEPATIC) TLB-CBC Platelet - w/Differential (85025-CBCD) TLB-Lipid Panel (80061-LIPID) TLB-TSH (Thyroid Stimulating Hormone) (84443-TSH) TLB-BNP (B-Natriuretic Peptide) (83880-BNPR) T-Vitamin D (25-Hydroxy) (42595-63875) TLB-PSA (Prostate Specific Antigen) (84153-PSA)  Problem # 4:  PAROXYSMAL ATRIAL FIBRILLATION (ICD-427.31) Holdiong NSR... His updated medication list for this problem includes:    Coumadin 5 Mg Tabs (Warfarin sodium) .Marland Kitchen... Take as directed    Adult Aspirin Low Strength 81 Mg Tbdp (Aspirin) ..... Once daily  Problem # 5:  PERIPHERAL VASCULAR DISEASE (ICD-443.9) He is a severe vasculpath w/ prev stroke, CAE w/ DPA, AAA repair w/ Ao to iliac bypasses bilat, etc...  contine Coumadin/ ASA...  Problem # 6:  HYPERLIPIDEMIA (ICD-272.4) Stable on Lip20... His updated medication list for this problem includes:    Lipitor 20 Mg Tabs (Atorvastatin calcium) ..... Once daily  Problem # 7:  IRRITABLE BOWEL SYNDROME (ICD-564.1) Mult GI prob> he regulates bowels w/ Immodium, Miralax, Senakot-S, Align, etc....  Problem # 8:  DEGENERATIVE JOINT DISEASE (ICD-715.90) He has mod DJD & Neuropathy... His updated medication list for this problem includes:    Adult Aspirin Low Strength 81 Mg Tbdp (Aspirin) ..... Once daily  Problem # 9:  ANXIETY DEPRESSION (ICD-300.4) He is coping as best he can given his circumstances...  Complete Medication List: 1)  Muro 128 5 % Oint (Sodium chloride (hypertonic)) .... Apply to both eyes at bedtime 2)  Allegra 180 Mg Tabs (Fexofenadine hcl) .... Once daily as needed 3)  Flonase 50 Mcg/act Susp (Fluticasone propionate) .... 2 sprays each nostril once daily 4)  Advair Diskus 250-50 Mcg/dose Misc (Fluticasone-salmeterol) .... Inhale 1 puff two times a day, rinse mouth well. 5)  Coumadin 5 Mg Tabs (Warfarin sodium) .... Take as directed 6)  Adult Aspirin Low Strength 81 Mg Tbdp (Aspirin) .... Once  daily 7)  Lisinopril 10 Mg Tabs (Lisinopril) .... Take 1 tab by mouth once daily.Marland KitchenMarland Kitchen 8)  Furosemide 40 Mg Tabs (Furosemide) .... Take 1-2 tabs by mouth once daily 9)  Klor-con M20 20 Meq Tbcr (Potassium chloride crys cr) .... Take 1 tablet by mouth once a day 10)  Lipitor 20 Mg Tabs (Atorvastatin calcium) .... Once daily 11)  Pepcid Ac 10 Mg Tabs (Famotidine) .... As needed 12)  Align Caps (Probiotic product) .... Take 1 cap daily... 13)  Tums 500 Mg Chew (Calcium carbonate antacid) .... As needed 14)  Vitamin D 1000 Unit Tabs (Cholecalciferol) .... Take 1 cap by mouth once daily.... 15)  Miralax Powd (Polyethylene glycol 3350) .... Use as needed for constipation.... 16)  Senokot S 8.6-50 Mg Tabs (Sennosides-docusate sodium) .... Use as needed for constipation... 17)  Imodium Advanced 2-125 Mg Tabs (Loperamide-simethicone) .... Use as needed for diarrhea...  Other Orders: T-2 View CXR (71020TC)  Patient Instructions: 1)  Today we updated your med list- see below.... 2)  We refilled your Flonase & Lasix today... 3)  We also did your follow up CXR & FASTING blood work... please call the "phone tree" in a few days for your lab results.Marland KitchenMarland Kitchen 4)  Stay as active as possible... 5)  Call for any problems.Marland KitchenMarland Kitchen 6)  Please schedule a follow-up appointment in 4 months. Prescriptions: FUROSEMIDE 40 MG  TABS (FUROSEMIDE) take 1-2 tabs by mouth once daily  #60 x 11   Entered and Authorized by:   Michele Mcalpine MD   Signed by:   Michele Mcalpine MD on 08/14/2009   Method used:   Print then Give to Patient   RxID:   [id number] FLONASE 50 MCG/ACT  SUSP (FLUTICASONE PROPIONATE) 2 sprays each nostril once daily  #1 x 11   Entered and Authorized by:   Michele Mcalpine MD   Signed by:   Michele Mcalpine MD on 08/14/2009   Method used:   Print then Give to Patient   RxID:   [id number]

## 2010-03-09 NOTE — Medication Information (Signed)
Summary: rov/tm  Anticoagulant Therapy  Managed by: Weston Brass, PharmD Referring MD: Charlton Haws MD Supervising MD: Tenny Craw MD, Gunnar Fusi Indication 1: Deep Vein Thrombosis - Leg (ICD-451.1) Indication 2: Pulmonary Embolism and Infarction (ICD-415.1) Lab Used: [facility] [place] Site: [address] INR POC 1.6 INR RANGE 2 - 3  Dietary changes: yes       Details: increased green vegetables  Health status changes: no    Bleeding/hemorrhagic complications: no    Recent/future hospitalizations: no    Any changes in medication regimen? no    Recent/future dental: no  Any missed doses?: no       Is patient compliant with meds? yes       Allergies: 1)  Amoxicillin  Anticoagulation Management History:      The patient is taking warfarin and comes in today for a routine follow up visit.  Positive risk factors for bleeding include an age of 75 years or older and history of CVA/TIA.  The bleeding index is 'intermediate risk'.  Positive CHADS2 values include History of CHF, History of HTN, Age > 55 years old, and Prior Stroke/CVA/TIA.  The start date was 12/22/1997.  Anticoagulation responsible provider: Tenny Craw MD, Gunnar Fusi.  INR POC: 1.6.  Cuvette Lot#: [id number].  Exp: 07/2010.    Anticoagulation Management Assessment/Plan:      The patient's current anticoagulation dose is Coumadin 5 mg  tabs: take as directed.  The target INR is 2.0-3.0.  The next INR is due 06/29/2009.  Anticoagulation instructions were given to patient.  Results were reviewed/authorized by Weston Brass, PharmD.  He was notified by Weston Brass PharmD.         Prior Anticoagulation Instructions: INR 1.5 Today take 7.5mg s and Tuesday 5mg s then resume 5mg s daily except 2.30msg on Tuesdays and Thursdays. Recheck in 2 weeks.   Current Anticoagulation Instructions: INR 1.6  Take 7.5mg  (1 1/2 tablets) today then increase dose to 5mg  (1 tablet) every day except 2.5mg  (1/2 tablet) on Tuesday

## 2010-03-09 NOTE — Progress Notes (Signed)
Summary: cough-avelox x7days  Phone Note Call from Patient Call back at Home Phone [phone]   Caller: Patient Call For: nadel Reason for Call: Talk to Nurse Summary of Call: Patient calling saying he has developed a cough w/ mucus dark in color, no fever.  Asking for abx.  [facility] Initial call taken by: Lehman Prom,  December 30, 2009 12:08 PM  Follow-up for Phone Call        [name] sch for f/u with SN on 01/08/2010. [name] requesting an abx due to cough with yellow to brown mucus. He is still on Pred taper and denies any SOB, fever or wheezing. Says he does feel some better since on Pred taper. Pls advise on abx.Michel Bickers [facility]  December 30, 2009 12:22 PM  Additional Follow-up for Phone Call Additional follow up Details #1::        per SN----ok for pt to have avelox 400mg   #7  1 by mouth once daily until gone   thanks Randell Loop CMA  December 30, 2009 1:13 PM   pt advised rx sent.Carron Curie CMA  December 30, 2009 1:22 PM     New/Updated Medications: AVELOX 400 MG TABS (MOXIFLOXACIN HCL) Take 1 tablet by mouth once a day Prescriptions: AVELOX 400 MG TABS (MOXIFLOXACIN HCL) Take 1 tablet by mouth once a day  #7 x 0   Entered by:   Carron Curie CMA   Authorized by:   Michele Mcalpine MD   Signed by:   Carron Curie CMA on 12/30/2009   Method used:   Electronically to        [facility] [phone]* (retail)       [address] [place], [place]  [address]       Ph: [phone] or [phone]       Fax: [phone]   RxID:   [id number]

## 2010-03-09 NOTE — Miscellaneous (Signed)
Summary: Care Plan/[facility]  Care Plan/[facility]   Imported By: Lester Cabery 02/10/2009 10:51:49  _____________________________________________________________________  External Attachment:    Type:   Image     Comment:   External Document

## 2010-03-09 NOTE — Progress Notes (Signed)
Summary: nurse w/[facility] needs info   Phone Note From Other Clinic   Caller: Nurse Summary of Call: pt enrolled in heart failure program with [facility]-nurse needs to know if his congestive heart failure is class 3 or 4-pls call denise [phone] ext [phone] ok to leave message she has a confidential voicemail Initial call taken by: Glynda Jaeger,  August 20, 2009 3:18 PM  Follow-up for Phone Call        spoke with pt, he has no limitations with SOB or problems with heart failure. left message that pt is functional class one Deliah Goody, RN  August 21, 2009 5:58 PM

## 2010-03-09 NOTE — Medication Information (Signed)
Summary: coumadin f/u aorta/lea at 9:30  Anticoagulant Therapy  Managed by: Cloyde Reams, RN, BSN Referring MD: Charlton Haws MD Supervising MD: Shirlee Latch MD, Wilman Tucker Indication 1: Deep Vein Thrombosis - Leg (ICD-451.1) Indication 2: Pulmonary Embolism and Infarction (ICD-415.1) Lab Used: [facility] [place] Site: [address] INR POC 2.2 INR RANGE 2 - 3  Dietary changes: no    Health status changes: no    Bleeding/hemorrhagic complications: no    Recent/future hospitalizations: no    Any changes in medication regimen? no    Recent/future dental: no  Any missed doses?: no       Is patient compliant with meds? yes       Allergies: 1)  Amoxicillin  Anticoagulation Management History:      The patient is taking warfarin and comes in today for a routine follow up visit.  Positive risk factors for bleeding include an age of 75 years or older and history of CVA/TIA.  The bleeding index is 'intermediate risk'.  Positive CHADS2 values include History of CHF, History of HTN, Age > 75 years old, and Prior Stroke/CVA/TIA.  The start date was 12/22/1997.  Anticoagulation responsible provider: Shirlee Latch MD, Alexandrya Chim.  INR POC: 2.2.  Cuvette Lot#: [id number].  Exp: 10/2010.    Anticoagulation Management Assessment/Plan:      The patient's current anticoagulation dose is Coumadin 5 mg  tabs: take as directed.  The target INR is 2.0-3.0.  The next INR is due 08/24/2009.  Anticoagulation instructions were given to patient.  Results were reviewed/authorized by Cloyde Reams, RN, BSN.  He was notified by Cloyde Reams RN.         Prior Anticoagulation Instructions: INR 1.9  Take 1 1/2 tablets today then resume same dose of 1 tablet every day.   Current Anticoagulation Instructions: INR 2.2  Continue on same dosage 5mg  daily.  Recheck in 3 weeks.

## 2010-03-09 NOTE — Progress Notes (Signed)
Summary: diarrhea  Phone Note Call from Patient   Caller: Patient Call For: nadel Summary of Call: pt have had diarrhea for 5 days need to know what he can take Initial call taken by: Rickard Patience,  May 01, 2009 8:51 AM  Follow-up for Phone Call        called and spoke with pt.  pt c/o diarrhea "on and off" since Monday this week.  Pt states today he has already had 3 BMs this morning.  Pt states stool is "very loose."  Pt states he stopped taking the Align tabs on MOnday this week and stopped taking Activa on Tuesday thinking maybe the diarrhea was d/t this.  Pt states he took abx approx a month ago d/t infection in hand ( was hosp for this) and wonders if the abx could be the cause of this.  I recommended pt to take Imodium as directed on the box for the diarrhea. However, pt states he wanted this message forwarded to SN to address.  please advise. Aundra Millet Reynolds LPN  May 01, 2009 9:41 AM   allergies: amoxicillin.  Additional Follow-up for Phone Call Additional follow up Details #1::        per SN----yes the previous abx could be involved.  rec flagyl(generic is ok)  250mg    1 by mouth three times a day until gone  #21  and use immodium as needed .   Randell Loop [facility]  May 01, 2009 4:42 PM     Additional Follow-up for Phone Call Additional follow up Details #2::    pt advised. rx sent. Carron Curie CMA  May 01, 2009 4:52 PM   New/Updated Medications: FLAGYL 250 MG TABS (METRONIDAZOLE) Take 1 tablet by mouth three times a day Prescriptions: FLAGYL 250 MG TABS (METRONIDAZOLE) Take 1 tablet by mouth three times a day  #21 x 0   Entered by:   Carron Curie CMA   Authorized by:   Michele Mcalpine MD   Signed by:   Carron Curie CMA on 05/01/2009   Method used:   Electronically to        [facility] [phone]* (retail)       [address] [place], [place]  [address]       Ph: [phone] or [phone]       Fax: [phone]   RxID:    [id number]

## 2010-03-09 NOTE — Medication Information (Signed)
Summary: rov/sp  Anticoagulant Therapy  Managed by: Bethena Midget, RN, BSN Referring MD: Charlton Haws MD Supervising MD: Myrtis Ser MD, Tinnie Gens Indication 1: Deep Vein Thrombosis - Leg (ICD-451.1) Indication 2: Pulmonary Embolism and Infarction (ICD-415.1) Lab Used: [facility]  Site: [address] INR POC 1.9 INR RANGE 2 - 3  Dietary changes: no    Health status changes: no    Bleeding/hemorrhagic complications: no    Recent/future hospitalizations: no    Any changes in medication regimen? no    Recent/future dental: no  Any missed doses?: no       Is patient compliant with meds? yes      Comments: Left ankle pt states has more swelling than normal. Thinks he might have got some extra sodium.   Allergies: 1)  Amoxicillin  Anticoagulation Management History:      The patient comes in today for his initial visit for anticoagulation therapy.  Positive risk factors for bleeding include an age of 75 years or older and history of CVA/TIA.  The bleeding index is 'intermediate risk'.  Positive CHADS2 values include History of CHF, History of HTN, Age > 37 years old, and Prior Stroke/CVA/TIA.  The start date was 12/22/1997.  Anticoagulation responsible provider: Myrtis Ser MD, Tinnie Gens.  INR POC: 1.9.  Cuvette Lot#: [id number].  Exp: 09/2010.    Anticoagulation Management Assessment/Plan:      The patient's current anticoagulation dose is Coumadin 5 mg  tabs: take as directed.  The target INR is 2.0-3.0.  The next INR is due 07/17/2009.  Anticoagulation instructions were given to patient.  Results were reviewed/authorized by Bethena Midget, RN, BSN.  He was notified by Bethena Midget, RN, BSN.         Prior Anticoagulation Instructions: INR 1.6  Take 7.5mg  (1 1/2 tablets) today then increase dose to 5mg  (1 tablet) every day except 2.5mg  (1/2 tablet) on Tuesday   Current Anticoagulation Instructions: INR 1.9 Today take 7.5mg  then change dose to 5mg s everyday. Recheck in 2  weeks.

## 2010-03-09 NOTE — Medication Information (Signed)
Summary: rov/jk  Anticoagulant Therapy  Managed by: Lyna Poser PharmD Referring MD: Charlton Haws MD Supervising MD: Tenny Craw MD,Paula Indication 1: Deep Vein Thrombosis - Leg (ICD-451.1) Indication 2: Pulmonary Embolism and Infarction (ICD-415.1) Lab Used: [facility] [place] Site: [address] INR POC 2.4 INR RANGE 2 - 3  Dietary changes: no    Health status changes: no    Bleeding/hemorrhagic complications: no    Recent/future hospitalizations: no    Any changes in medication regimen? no    Recent/future dental: no     Details: eye appointment at [facility]  Any missed doses?: no       Is patient compliant with meds? yes       Allergies: 1)  Amoxicillin  Anticoagulation Management History:      The patient is taking warfarin and comes in today for a routine follow up visit.  Positive risk factors for bleeding include an age of 75 years or older and history of CVA/TIA.  The bleeding index is 'intermediate risk'.  Positive CHADS2 values include History of CHF, History of HTN, Age > 75 years old, and Prior Stroke/CVA/TIA.  The start date was 12/22/1997.  Anticoagulation responsible provider: Tenny Craw MD,Paula.  INR POC: 2.4.  Cuvette Lot#: [id number].  Exp: 11/2010.    Anticoagulation Management Assessment/Plan:      The patient's current anticoagulation dose is Coumadin 5 mg  tabs: take as directed.  The target INR is 2.0-3.0.  The next INR is due 11/20/2009.  Anticoagulation instructions were given to patient.  Results were reviewed/authorized by Lyna Poser PharmD.  He was notified by Lyna Poser PharmD.         Prior Anticoagulation Instructions: INR 3.0  Take 1/2 tablet today.  Then resume normal dose of taking 1 tablet (5mg ) every day.  Recheck in 4 weeks.    Current Anticoagulation Instructions: INR 2.4  Continue taking 1 tablet everyday. We'll see you in 4 weeks.

## 2010-03-09 NOTE — Medication Information (Signed)
Summary: rov/ln   Anticoagulant Therapy  Managed by: Weston Brass, PharmD Referring MD: Charlton Haws MD Supervising MD: Eden Emms MD, Theron Arista Indication 1: Deep Vein Thrombosis - Leg (ICD-451.1) Indication 2: Pulmonary Embolism and Infarction (ICD-415.1) Lab Used: [facility] [place] Site: [address] INR POC 3.0 INR RANGE 2 - 3  Dietary changes: no    Health status changes: no    Bleeding/hemorrhagic complications: yes       Details: Redness on right hand hand and arm.  Had CT last week.  Asked Christin, CT tech to evaluate.  Pt did not have any extravastation reported during procedure.  Asked Dr. Eden Emms to evaluate.  He did not feel it was infection and pt had good circulation.  Suggested he see dermatology.  Called [place] [facility] (Dr. Yetta Barre) to set up an appt.   Recent/future hospitalizations: no    Any changes in medication regimen? no    Recent/future dental: no  Any missed doses?: no       Is patient compliant with meds? yes       Allergies: 1)  Amoxicillin  Anticoagulation Management History:      Positive risk factors for bleeding include an age of 75 years or older and history of CVA/TIA.  The bleeding index is 'intermediate risk'.  Positive CHADS2 values include History of CHF, History of HTN, Age > 86 years old, and Prior Stroke/CVA/TIA.  The start date was 12/22/1997.  Anticoagulation responsible provider: Eden Emms MD, Theron Arista.  INR POC: 3.0.  Exp: 11/2010.    Anticoagulation Management Assessment/Plan:      The patient's current anticoagulation dose is Coumadin 5 mg  tabs: take as directed.  The target INR is 2.0-3.0.  The next INR is due 10/23/2009.  Anticoagulation instructions were given to patient.  Results were reviewed/authorized by Weston Brass, PharmD.  He was notified by Gweneth Fritter, PharmD Candidate.         Prior Anticoagulation Instructions: INR 2.4  Continue same dose of 1 tab daily.  Re-check in 4 weeks.   Current Anticoagulation  Instructions: INR 3.0  Take 1/2 tablet today.  Then resume normal dose of taking 1 tablet (5mg ) every day.  Recheck in 4 weeks.

## 2010-03-09 NOTE — Medication Information (Signed)
Summary: [facility]  Anticoagulant Therapy  Managed by: Inactive Referring MD: Charlton Haws MD Supervising MD: Ladona Ridgel MD, Sharlot Gowda Indication 1: Deep Vein Thrombosis - Leg (ICD-451.1) Indication 2: Pulmonary Embolism and Infarction (ICD-415.1) Lab Used: [facility] Site: [facility] INR RANGE 2 - 3          Comments: Pt. presently at [facility] per his daughter. Was discharged from the hosp. to their care.   Allergies: 1)  Amoxicillin  Anticoagulation Management History:      Positive risk factors for bleeding include an age of 75 years or older and history of CVA/TIA.  The bleeding index is 'intermediate risk'.  Positive CHADS2 values include History of CHF, History of HTN, Age > 22 years old, and Prior Stroke/CVA/TIA.  The start date was 12/22/1997.  Anticoagulation responsible provider: Ladona Ridgel MD, Sharlot Gowda.  Exp: 05/2010.    Anticoagulation Management Assessment/Plan:      The patient's current anticoagulation dose is Coumadin 5 mg  tabs: take as directed.  The target INR is 2.0-3.0.  The next INR is due 03/20/2009.  Anticoagulation instructions were given to patient.  Results were reviewed/authorized by Inactive.         Prior Anticoagulation Instructions: INR 2.4 Continue with same dosage of 5mg  tablet daily except 2.5mg  on Tuesdays and Thursdays Recheck in 4 weeks

## 2010-03-09 NOTE — Miscellaneous (Signed)
Summary: Plan of Care/[facility]  Plan of Care/[facility]   Imported By: Sherian Rein 10/01/2009 07:46:00  _____________________________________________________________________  External Attachment:    Type:   Image     Comment:   External Document

## 2010-03-09 NOTE — Miscellaneous (Signed)
Summary: Plan of Care/[facility]  Plan of Care/[facility]   Imported By: Sherian Rein 05/06/2009 14:36:14  _____________________________________________________________________  External Attachment:    Type:   Image     Comment:   External Document

## 2010-03-09 NOTE — Progress Notes (Signed)
Summary: pt in hosp  Phone Note Call from Patient   Caller: Patient Call For: NADEL Summary of Call: pt cancelled his appt for this fri as he is still in hosp- [facility] room 1444 (hand/ foot swollen- low white blood count).  Initial call taken by: Tivis Ringer, CNA,  January 06, 2010 1:37 PM  Follow-up for Phone Call        FYI for Dr. Kriste Basque. Carron Curie CMA  January 06, 2010 2:55 PM    SN is aware Randell Loop CMA  January 06, 2010 3:00 PM

## 2010-03-09 NOTE — Medication Information (Signed)
Summary: [facility]v coumadin - [facility]  Anticoagulant Therapy  Managed by: Bethena Midget, RN, BSN Referring MD: Charlton Haws MD Supervising MD: Riley Kill MD, Maisie Fus Indication 1: Deep Vein Thrombosis - Leg (ICD-451.1) Indication 2: Pulmonary Embolism and Infarction (ICD-415.1) Lab Used: [facility] [place] Site: [address] INR POC 1.5 INR RANGE 2 - 3  Dietary changes: no    Health status changes: yes       Details: Rt hand very edematous and redden in color. No warmth noted. Pt. states it is painful. He is on his way to ER. Will contact us to inform us of outcome  Bleeding/hemorrhagic complications: no    Recent/future hospitalizations: no    Any changes in medication regimen? yes       Details: Prednisone on 1/2 pill til Friday.   Recent/future dental: no  Any missed doses?: no       Is patient compliant with meds? yes       Allergies: 1)  Amoxicillin  Anticoagulation Management History:      The patient is taking warfarin and comes in today for a routine follow up visit.  Positive risk factors for bleeding include an age of 75 years or older and history of CVA/TIA.  The bleeding index is 'intermediate risk'.  Positive CHADS2 values include History of CHF, History of HTN, Age > 75 years old, and Prior Stroke/CVA/TIA.  The start date was 12/22/1997.  Anticoagulation responsible Mckennah Kretchmer: Riley Kill MD, Maisie Fus.  INR POC: 1.5.  Cuvette Lot#: [id number].  Exp: 01/2011.    Anticoagulation Management Assessment/Plan:      The patient's current anticoagulation dose is Coumadin 5 mg  tabs: take as directed.  The target INR is 2.0-3.0.  The next INR is due 01/11/2010.  Anticoagulation instructions were given to patient.  Results were reviewed/authorized by Bethena Midget, RN, BSN.  He was notified by Bethena Midget, RN, BSN.         Prior Anticoagulation Instructions: INR  NO COUMADIN TODAY MON 11/21 Coumadin 5mg  tabs,  take 1/2 tab until we see you again  Current Anticoagulation  Instructions: INR 1.5 Change dose to 1 pill everyday. Recheck in 7-10 days.

## 2010-03-09 NOTE — Medication Information (Signed)
Summary: rov/ewj  Anticoagulant Therapy  Managed by: Weston Brass, PharmD Referring MD: Charlton Haws MD Supervising MD: Eden Emms MD, Theron Arista Indication 1: Deep Vein Thrombosis - Leg (ICD-451.1) Indication 2: Pulmonary Embolism and Infarction (ICD-415.1) Lab Used: [facility] [place] Site: [address] INR POC 2.4 INR RANGE 2 - 3  Dietary changes: no    Health status changes: no    Bleeding/hemorrhagic complications: yes       Details: larger bruise on L arm, but healed  Recent/future hospitalizations: no    Any changes in medication regimen? no    Recent/future dental: no  Any missed doses?: no       Is patient compliant with meds? yes       Allergies: 1)  Amoxicillin  Anticoagulation Management History:      The patient is taking warfarin and comes in today for a routine follow up visit.  Positive risk factors for bleeding include an age of 75 years or older and history of CVA/TIA.  The bleeding index is 'intermediate risk'.  Positive CHADS2 values include History of CHF, History of HTN, Age > 72 years old, and Prior Stroke/CVA/TIA.  The start date was 12/22/1997.  Anticoagulation responsible provider: Eden Emms MD, Theron Arista.  INR POC: 2.4.  Cuvette Lot#: [id number].  Exp: 11/2010.    Anticoagulation Management Assessment/Plan:      The patient's current anticoagulation dose is Coumadin 5 mg  tabs: take as directed.  The target INR is 2.0-3.0.  The next INR is due 09/21/2009.  Anticoagulation instructions were given to patient.  Results were reviewed/authorized by Weston Brass, PharmD.  He was notified by Dillard Cannon.         Prior Anticoagulation Instructions: INR 2.2  Continue on same dosage 5mg  daily.  Recheck in 3 weeks.    Current Anticoagulation Instructions: INR 2.4  Continue same dose of 1 tab daily.  Re-check in 4 weeks.

## 2010-03-09 NOTE — Medication Information (Signed)
Summary: [facility]  Anticoagulant Therapy  Managed by: Cloyde Reams, RN, BSN Referring MD: Charlton Haws MD Supervising MD: Eden Emms MD, Theron Arista Indication 1: Deep Vein Thrombosis - Leg (ICD-451.1) Indication 2: Pulmonary Embolism and Infarction (ICD-415.1) Lab Used: [facility] Site: [facility] PT 22.7 INR POC 2.02 INR RANGE 2 - 3   Health status changes: no      Any changes in medication regimen? yes       Details: med for constipation prn.    Any missed doses?: no       Is patient compliant with meds? yes      Comments: Pt discharged home 04/08/09.  Allergies: 1)  Amoxicillin  Anticoagulation Management History:      His anticoagulation is being managed by telephone today.  Positive risk factors for bleeding include an age of 75 years or older and history of CVA/TIA.  The bleeding index is 'intermediate risk'.  Positive CHADS2 values include History of CHF, History of HTN, Age > 80 years old, and Prior Stroke/CVA/TIA.  The start date was 12/22/1997.  Prothrombin time is 22.7.  Anticoagulation responsible provider: Eden Emms MD, Theron Arista.  INR POC: 2.02.  Exp: 05/2010.    Anticoagulation Management Assessment/Plan:      The patient's current anticoagulation dose is Coumadin 5 mg  tabs: take as directed.  The target INR is 2.0-3.0.  The next INR is due 05/08/2009.  Anticoagulation instructions were given to patient.  Results were reviewed/authorized by Cloyde Reams, RN, BSN.  He was notified by Cloyde Reams RN.         Prior Anticoagulation Instructions: INR 2.4 Continue with same dosage of 5mg  tablet daily except 2.5mg  on Tuesdays and Thursdays Recheck in 4 weeks  Current Anticoagulation Instructions: INR 2.02  Called spoke with pt advised to take 1.5 tablets today then resume same dosage 5mg  daily except 2.5mg  on Tuesdays and Thursdays.  Recheck in 10 days made OV in CVRR.

## 2010-03-09 NOTE — Progress Notes (Signed)
Summary: hand hurting  Phone Note Call from Patient   Caller: Patient Call For: nadel Summary of Call: pt called complaining about pain in hand,left.  swelling from pinky to wrist, red and pt is on coumadin.  Pt denies any fall or injury to hand.  Wonders if may be staff infection.  Per Lennon Alstrom, pt advised to go to ED.  Pt verbalized his understanding. Initial call taken by: Eugene Gavia,  March 18, 2009 3:14 PM

## 2010-03-09 NOTE — Medication Information (Signed)
Summary: rov/tm  Anticoagulant Therapy  Managed by: Weston Brass, PharmD Referring MD: Charlton Haws MD Supervising MD: Myrtis Ser MD, Tinnie Gens Indication 1: Deep Vein Thrombosis - Leg (ICD-451.1) Indication 2: Pulmonary Embolism and Infarction (ICD-415.1) Lab Used: [facility] [place] Site: [address] INR POC 1.9 INR RANGE 2 - 3  Dietary changes: yes       Details: extra cole slaw and brocolli this week  Health status changes: no    Bleeding/hemorrhagic complications: no    Recent/future hospitalizations: no    Any changes in medication regimen? no    Recent/future dental: no  Any missed doses?: yes     Details: may have missed a dose but unsure; took 1/2 tablet just in case  Is patient compliant with meds? yes       Allergies: 1)  Amoxicillin  Anticoagulation Management History:      The patient is taking warfarin and comes in today for a routine follow up visit.  Positive risk factors for bleeding include an age of 75 years or older and history of CVA/TIA.  The bleeding index is 'intermediate risk'.  Positive CHADS2 values include History of CHF, History of HTN, Age > 75 years old, and Prior Stroke/CVA/TIA.  The start date was 12/22/1997.  Anticoagulation responsible provider: Myrtis Ser MD, Tinnie Gens.  INR POC: 1.9.  Cuvette Lot#: [id number].  Exp: 09/2010.    Anticoagulation Management Assessment/Plan:      The patient's current anticoagulation dose is Coumadin 5 mg  tabs: take as directed.  The target INR is 2.0-3.0.  The next INR is due 07/31/2009.  Anticoagulation instructions were given to patient.  Results were reviewed/authorized by Weston Brass, PharmD.  He was notified by Weston Brass PharmD.         Prior Anticoagulation Instructions: INR 1.9 Today take 7.5mg  then change dose to 5mg s everyday. Recheck in 2 weeks.   Current Anticoagulation Instructions: INR 1.9  Take 1 1/2 tablets today then resume same dose of 1 tablet every day.

## 2010-03-09 NOTE — Medication Information (Signed)
Summary: rov/ewj  Anticoagulant Therapy  Managed by: Cloyde Reams, RN, BSN Referring MD: Charlton Haws MD Supervising MD: Eden Emms MD, Theron Arista Indication 1: Deep Vein Thrombosis - Leg (ICD-451.1) Indication 2: Pulmonary Embolism and Infarction (ICD-415.1) Lab Used: [facility] [place] Site: [address] INR POC 3.0 INR RANGE 2 - 3  Dietary changes: no    Health status changes: no    Bleeding/hemorrhagic complications: no    Recent/future hospitalizations: no    Any changes in medication regimen? yes       Details: Last dose of Flagyl today.    Recent/future dental: no  Any missed doses?: no       Is patient compliant with meds? yes       Allergies: 1)  Amoxicillin  Anticoagulation Management History:      The patient is taking warfarin and comes in today for a routine follow up visit.  Positive risk factors for bleeding include an age of 75 years or older and history of CVA/TIA.  The bleeding index is 'intermediate risk'.  Positive CHADS2 values include History of CHF, History of HTN, Age > 34 years old, and Prior Stroke/CVA/TIA.  The start date was 12/22/1997.  Anticoagulation responsible provider: Eden Emms MD, Theron Arista.  INR POC: 3.0.  Cuvette Lot#: [id number].  Exp: 06/2010.    Anticoagulation Management Assessment/Plan:      The patient's current anticoagulation dose is Coumadin 5 mg  tabs: take as directed.  The target INR is 2.0-3.0.  The next INR is due 05/29/2009.  Anticoagulation instructions were given to patient.  Results were reviewed/authorized by Cloyde Reams, RN, BSN.  He was notified by Cloyde Reams RN.         Prior Anticoagulation Instructions: INR 2.02  Called spoke with pt advised to take 1.5 tablets today then resume same dosage 5mg  daily except 2.5mg  on Tuesdays and Thursdays.  Recheck in 10 days made OV in CVRR.  Current Anticoagulation Instructions: INR 3.0   Continue on same dosage 5mg  daily except 2.5mg  on Tuesdays and Thursdays.   Recheck in 3 weeks.

## 2010-03-09 NOTE — Letter (Signed)
Summary: Appointment - Reminder 2  [facility], Main Office  [address]   [place], [place] [address]   Phone: [phone]  Fax: [phone]     May 20, 2009 MRN: [id number]   [facility] [address] [place], [place]  [address]   Dear Mr. CUDMORE,  Our records indicate that it is time to schedule a follow-up appointment with Dr. Eden Emms. It is very important that we reach you to schedule this appointment. We look forward to participating in your health care needs. Please contact us at the number listed above at your earliest convenience to schedule your appointment.  If you are unable to make an appointment at this time, give [place] a call so we can update our records.     Sincerely,   Migdalia Dk [facility] 2 Scheduling Team

## 2010-03-09 NOTE — Medication Information (Signed)
Summary: rov/ewj  Anticoagulant Therapy  Managed by: Bethena Midget, RN, BSN Referring MD: Charlton Haws MD Supervising MD: Jens Som MD, Arlys John Indication 1: Deep Vein Thrombosis - Leg (ICD-451.1) Indication 2: Pulmonary Embolism and Infarction (ICD-415.1) Lab Used: [facility] [place] Site: [address] INR POC 1.5 INR RANGE 2 - 3  Dietary changes: no    Health status changes: no    Bleeding/hemorrhagic complications: no    Recent/future hospitalizations: no    Any changes in medication regimen? no    Recent/future dental: no  Any missed doses?: yes     Details: Pt. states one might have missed his mouth.   Is patient compliant with meds? yes       Allergies: 1)  Amoxicillin  Anticoagulation Management History:      The patient is taking warfarin and comes in today for a routine follow up visit.  Positive risk factors for bleeding include an age of 75 years or older and history of CVA/TIA.  The bleeding index is 'intermediate risk'.  Positive CHADS2 values include History of CHF, History of HTN, Age > 75 years old, and Prior Stroke/CVA/TIA.  The start date was 12/22/1997.  Anticoagulation responsible provider: Jens Som MD, Arlys John.  INR POC: 1.5.  Cuvette Lot#: [id number].  Exp: 07/2010.    Anticoagulation Management Assessment/Plan:      The patient's current anticoagulation dose is Coumadin 5 mg  tabs: take as directed.  The target INR is 2.0-3.0.  The next INR is due 06/15/2009.  Anticoagulation instructions were given to patient.  Results were reviewed/authorized by Bethena Midget, RN, BSN.  He was notified by Bethena Midget, RN, BSN.         Prior Anticoagulation Instructions: INR 3.0   Continue on same dosage 5mg  daily except 2.5mg  on Tuesdays and Thursdays.  Recheck in 3 weeks.    Current Anticoagulation Instructions: INR 1.5 Today take 7.5mg s and Tuesday 5mg s then resume 5mg s daily except 2.3msg on Tuesdays and Thursdays. Recheck in 2 weeks.

## 2010-03-11 NOTE — Progress Notes (Signed)
Summary: order request  Phone Note From Other Clinic   Caller: heather smith- physical therapist- [facility] Call For: Joelyn Lover Summary of Call: wants a verbal order to continue PT w/ pt for 3 more wks. also needs order for wheelchai "cushion" as well this can be faxed to: [phone]. caller says this was mentioned at last ov but hasn't received order for this. needs at least 3" cushion in order to move pt from wheelchair. call heather at cell [phone] Initial call taken by: Tivis Ringer, CNA,  February 22, 2010 3:06 PM  Follow-up for Phone Call        order faxed for wheelchair cushion---lmom for heather to give verbal order for 3 more weeks of PT Randell Loop [facility]  February 22, 2010 3:23 PM   Additional Follow-up for Phone Call Additional follow up Details #1::        heather returned my call and stated that PT/Ot will work with pt for a few more weeks---this seems to benefit the pt very well.  heather is also aware that rx for wheelchair cushion has been faxed to the number given Randell Loop CMA  February 22, 2010 4:02 PM

## 2010-03-11 NOTE — Medication Information (Signed)
Summary: Coumadin Clinic  Anticoagulant Therapy  Managed by: Bethena Midget, RN, BSN Referring MD: Charlton Haws MD Supervising MD: Gala Romney MD, Reuel Boom Indication 1: Deep Vein Thrombosis - Leg (ICD-451.1) Indication 2: Pulmonary Embolism and Infarction (ICD-415.1) Lab Used: [facility] [place] Site: [address] PT 19.4 INR POC 2.5 INR RANGE 2 - 3  Dietary changes: no    Health status changes: no    Bleeding/hemorrhagic complications: no    Recent/future hospitalizations: no    Any changes in medication regimen? no    Recent/future dental: no  Any missed doses?: no       Is patient compliant with meds? yes      4  Allergies: 1)  Amoxicillin  Anticoagulation Management History:      His anticoagulation is being managed by telephone today.  Positive risk factors for bleeding include an age of 75 years or older and history of CVA/TIA.  The bleeding index is 'intermediate risk'.  Positive CHADS2 values include History of CHF, History of HTN, Age > 56 years old, and Prior Stroke/CVA/TIA.  The start date was 12/22/1997.  Prothrombin time is 19.4.  Anticoagulation responsible provider: Bensimhon MD, Reuel Boom.  INR POC: 2.5.    Anticoagulation Management Assessment/Plan:      The patient's current anticoagulation dose is Coumadin 4 mg tabs: take as directed by the Coumadin Clinic....  The target INR is 2.0-3.0.  The next INR is due 03/15/2010.  Anticoagulation instructions were given to patient.  Results were reviewed/authorized by Bethena Midget, RN, BSN.  He was notified by Bethena Midget, RN, BSN.         Prior Anticoagulation Instructions: INR 2.2  Spoke with pt.  Continue same dose of 4 mg daily.  Recheck INR in 2 weeks. Gave orders to [place] with [facility].   Current Anticoagulation Instructions: INR 2.5 Continue 4mg s everyday. Recheck in 4 weeks.  HH discharging appt s/c for office. HH nurse made pt aware.

## 2010-03-11 NOTE — Medication Information (Signed)
Summary: Coumadin Clinic   Anticoagulant Therapy  Managed by: Weston Brass, PharmD Referring MD: Charlton Haws MD Supervising MD: Tenny Craw MD, Gunnar Fusi Indication 1: Deep Vein Thrombosis - Leg (ICD-451.1) Indication 2: Pulmonary Embolism and Infarction (ICD-415.1) Lab Used: [facility] [place] Site: [address] INR POC 2.1 INR RANGE 2 - 3  Dietary changes: no    Health status changes: no    Bleeding/hemorrhagic complications: no    Recent/future hospitalizations: yes       Details: Pt has been in [facility] for rehab since hospitalization.  Unsure what INR has been previous to this reading  Any changes in medication regimen? yes       Details: Coumadin dose changed to 4mg  daily  Recent/future dental: no  Any missed doses?: no       Is patient compliant with meds? yes       Allergies: 1)  Amoxicillin  Anticoagulation Management History:      His anticoagulation is being managed by telephone today.  Positive risk factors for bleeding include an age of 75 years or older and history of CVA/TIA.  The bleeding index is 'intermediate risk'.  Positive CHADS2 values include History of CHF, History of HTN, Age > 60 years old, and Prior Stroke/CVA/TIA.  The start date was 12/22/1997.  Anticoagulation responsible provider: Tenny Craw MD, Gunnar Fusi.  INR POC: 2.1.  Exp: 01/2011.    Anticoagulation Management Assessment/Plan:      The patient's current anticoagulation dose is Coumadin 5 mg  tabs: take as directed.  The target INR is 2.0-3.0.  The next INR is due 02/02/2010.  Anticoagulation instructions were given to patient.  Results were reviewed/authorized by Weston Brass, PharmD.  He was notified by Weston Brass PharmD.         Prior Anticoagulation Instructions: INR 1.5 Change dose to 1 pill everyday. Recheck in 7-10 days.   Current Anticoagulation Instructions: INR 2.1  Continue same dose of 4mg  every day.  Recheck INR in 1 week.  Orders given to RN with [facility].

## 2010-03-11 NOTE — Assessment & Plan Note (Signed)
Summary: rov/jd   CC:  6 month ROV & post hosp check....  History of Present Illness: 77 y/o WM here for a follow up visit... he has mult med problems as noted below... Followed for general medical purposes w/ hx of recurrent bronchitis & allergies; hx PTE/ DVT/ & IVC filter on Coumadin; HBP; PAF; cerebrovasc dis w/ hx stroke & prev right CAE; ASPVD w/ prev AAA repair & subseq bilat Ao-iliac bypasses; Hyperchol; Prostate cancer; neuropathy; and anxiety/ depression...   ~  April 16, 2009:  he saw Walker Kehr for Cards f/u 12/10- Sotolol stopped due to Chilo on EKG, he continues on Coumadin thru CC- doing satis... he was hosp by [facility] 2/11 for cellulitis left hand ?etiology- treated w/ IV Vanco & Clinda & resolved [employer], XRays & Labs reviewed)... he was disch to [facility] for rehab- & ret home 04/08/09... still weak, has help at home, doing exercises...    ~  August 14, 2009:  he saw Walker Kehr 6/11- stable, w/ f/u LE Dopplers and CDopplers pending... he is still weak, doing his own exercises at home- offered more PT but he declines... notes some incr swelling since he cut his Lasix- now back to 1 tab daily & tthinks he might need more on occas... we discussed f/u CXR (chr changes, NAD) & LABS (generally stable, NAD).Marland Kitchen.   ~  February 02, 2010:  DrNishan sent him to [facility]- DrEarly 8/11 w/ diffuse aneurysmal changes (s/p AAA repair 2000 & right CAE 2001, hx DVT & IVC filter on Coumadin);  of greatest concern is  ~5cm left pop art aneurysm but he is felt to be too high risk for surg therefore rec observation alone & he will f/u w/ Walker Kehr for [employer]...  he was hosp by [facility] 11/11 for right arm cellulitis & ?pneumonia> treated w/ Vanco/ Avelox (cults neg, pos MRSA screen)... he went to [facility] for rehab now back home- lives alone w/ help 8H per day (son has Parkinson's & won't move to [place] w/ daugh)... he has mult somatic complaints> eyes "br[facility] by [facility]igengack at [facility]"; weak & BP low; constipated;  legs swelling; wants cushion for wheelchair; etc...    Current Problem List:  OPHTHALMOLOGY - followed by [facility]...  ALLERGIC RHINITIS (ICD-477.9) - on ALLEGRA 180mg  Prn and FLONASE Qhs...  BRONCHITIS, RECURRENT (ICD-491.9) - on ADVAIR 250Bid... stable- mild cough/ plhegm/ no change in dyspnea.  ~  CXR 7/11 showed some hyperinflation, no change scarring & vol loss RLL, NAD.Marland Kitchen.  ~  CXR 11/11 in hosp showed scarring right base w/ incr opac r/o superimposed pneum > he was placed on Pred during this adm & weaned slowly as outpt...  ~  CXR 12/11 here showed baseline film w/ basilar scarring & NAD... on Pred 10mg /d & asked to wean to 5mg /d...  Hx of PULMONARY EMBOLISM (ICD-415.19) - he had a DVT w/ PTE in 2001 w/ IVC filter placed... he remains on COUMADIN w/ regular checks in the Coumadin Clinic- doing well.  HYPERTENSION (ICD-401.9) - on LISINOPRIL 10mg /d, & LASIX 40mg  daily w/ K20/d... BP= 100/68 today, & similar at home... takes meds regularly & tolerating well... he notes some fatigue, intermittent dyspnea & dizziness; but denies HA, visual changes, CP, palipit, syncope, edema, etc...   ~  labs 10/10 showed BUN= 17, Creat= 1.0, K= 4.7  ~  11/10: c/o incr dizzy & weak- rec to decr the Lasix to 1/2 tab daily...  ~  2/11:  hosp records show norm CBC, BMet, TSH,  Urine.  ~  7/11: c/o incr edema & wants to incr Lasix to 1-2 Qam; BUN=28, Creat=1.2, K=4.2, BNP=97  ~  Labs 11/11 in hosp on Lasix 40mg /d showed BUN/ Creat= 29/ 1.5 ==> 18/ 0.9 & BNP= 113.  PAROXYSMAL ATRIAL FIBRILLATION (ICD-427.31) - prev on Sotalol80 but stopped 12/10 by DrNishan...  remains on Coumadin via clinic... he has been maintaining NSR on his EKG's & by exam...  ~  EKG 12/10 showed SBrady w/ RBBB & 1st degree AVB...  CEREBROVASCULAR DISEASE (ICD-437.9) - on ASA 81mg /d in addition to the Coumadin...   ~  he is s/p right carotid endarterectomy 7/01 by Windell Moulding...   ~  MRI 8/04 showed intracranial atherosclerotic  changes w/ marked ectasia of V-B sys, sm right vertebral w/ prox stenosis, & >50% left ICA stenosis...   ~  CT Brain 1/09 showed atrophy and chr microvacs ischemic changes & remote IC lacune...  ~  CDoppler 5/09 showed patent right CAE w/ DPA, mild plaque in bulb, 0-39% bilat ICA stenoses- no change...  ~  CDoppler 5/10 showed patent right CAE w/ DPA, stable mild left carotid dis, 0-39% bilat- f/u 5yr.  PERIPHERAL VASCULAR DISEASE (ICD-443.9) - s/p AAA repair 8/00 & bilat Ao to iliac bypasses in 2001 by DrLawson... he had mult post-op complications and a long hosp course... vasc f/u by [facility]...  ~  2011:  f/u vasc eval by Walker Kehr & referral to DrEarly for review> diffuse aneurysmal changes w/ 5cm left Pop art aneurysm- he was felt to be too high risk for surg & they rec observation...   VENOUS INSUFFICIENCY, CHRONIC (ICD-459.81) - chr ven insuffic due to his mult DVT episodes & prev IVC filter ?2001.  ~  Venous Dopplers right leg 8/09 showed no evid for DVT, no superfic clots, etc...  ~  8/09 developed cellulitis right leg which resolved to Keflex & local care...  ~  [facility] 2/11 by [facility] for left hand cellulitis...  ~  [facility] 11/11 by [facility] for right arm cellulitis...  HYPERLIPIDEMIA (ICD-272.4) - on LIPITOR 20mg /d & prev FLP at goal on this dose...  ~  FLP 5/08 showed TChol 124, Tg 99, HDL 40, LDL 65  ~  FLP 5/09 showed TChol 120, TG 93, HDL 36, LDL 66  ~  FLP 10/10 showed TChol 123, TG 96, HDL 47, LDL 57  GERD (ICD-530.81) - on PEPCID 20mg /d...  DIVERTICULOSIS OF COLON (ICD-562.10) IRRITABLE BOWEL SYNDROME (ICD-564.1) - he has diarrhea (uses Immodium) & constipation (uses Miralax/ Senakot-S) w/ a tough job regulating (uses ALIGN)... COLONIC POLYPS (ICD-211.3) - last colonoscopy 8/99 showed divertics, no recurrent polyps... last polyps removed 1996= adenomatous... also had incidental cecal lipoma seen.  Hx of PROSTATE CANCER (ICD-185) - s/p radical prostatectomy in  1994... he is followed by DrWrenn & has urinary incontinence for which he uses pads etc...  ~  labs 10/10 showed PSA= 0.03  ~  labs 7/11 showed PSA= 0.04  DEGENERATIVE JOINT DISEASE (ICD-715.90) - he has a known lumbar disc disease and cervical spondylosis... he also takes Vit D 1000 u daily...   ~  labs 7/11 showed Vit D level = 30... continue OTC supplement.  Hx of STROKE (ICD-434.91) - he was hospitalized 8/04 w/ stroke and MRI showed acute left pontine infarct + diffuse intracranial atherosclerotic changes- see above- on ASA & Coumadin.  PERIPHERAL NEUROPATHY (ICD-356.9) - he has LBP, neuropathy and a gait abnormality...  ANXIETY DEPRESSION (ICD-300.4) - wife, Kathie Rhodes, died 06-20-2022  on hospice (severe pulm fibrosis from scleroderma/ CREST/ etc)...   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) HYPERTENSION (ICD-401.9) PAROXYSMAL ATRIAL FIBRILLATION (ICD-427.31) CEREBROVASCULAR DISEASE (ICD-437.9) PERIPHERAL VASCULAR DISEASE (ICD-443.9) VENOUS INSUFFICIENCY, CHRONIC (ICD-459.81) DVT, HX OF (ICD-453.40) HYPERLIPIDEMIA (ICD-272.4) GERD (ICD-530.81) DIVERTICULOSIS OF COLON (ICD-562.10) IRRITABLE BOWEL SYNDROME (ICD-564.1) COLONIC POLYPS (ICD-211.3) Hx of PROSTATE CANCER (ICD-185) DEGENERATIVE JOINT DISEASE (ICD-715.90) Hx of STROKE (ICD-434.91) PERIPHERAL NEUROPATHY (ICD-356.9) ANXIETY DEPRESSION (ICD-300.4)  Past Surgical History: NEOPLASM, MALIGNANT, PROSTATE, S/P PROSTATECTOMY, RAD (ICD-V10.46) CATARACT EXTRACTION, HX OF (ICD-V45.61) INGUINAL HERNIORRHAPHIES, BILATERAL, HX OF (ICD-V45.89) CAROTID ENDARTERECTOMY, RIGHT, HX OF (ICD-V15.1) ABDOMINAL AORTIC ANEURYSM REPAIR, HX OF (ICD-V15.1)  Family History: Reviewed history from 06/26/2007 and no changes  required. father deceased at age 56 from vessel hemorrhage mother deceased at age 30--broken hip--system shut down  Social History: Reviewed history from 06/25/2008 and no changes required. Widow- wife [name] died 2022-06-21 3 children pt is a chemist--retired non-smoker quit drinking 10 years ago  Review of Systems      See HPI       The patient complains of dyspnea on exertion, peripheral edema, muscle weakness, difficulty walking, and depression.  The patient denies anorexia, fever, weight loss, weight gain, vision loss, decreased hearing, hoarseness, chest pain, syncope, prolonged cough, headaches, hemoptysis, abdominal pain, melena, hematochezia, severe indigestion/heartburn, hematuria, incontinence, suspicious skin lesions, transient blindness, unusual weight change, abnormal bleeding, enlarged lymph nodes, and angioedema.    Vital Signs:  Patient profile:   77 year old male Height:      72 inches O2 Sat:      100 % on Room air Temp:     97.7 degrees F oral Pulse rate:   96 / minute BP sitting:   100 / 68  (left arm) Cuff size:   regular  Vitals Entered By: Randell Loop CMA (February 02, 2010 2:07 PM)  O2 Sat at Rest %:  100 O2 Flow:  Room air CC: 6 month ROV & post hosp check... Is Patient Diabetic? No Pain Assessment Patient in pain? yes      Comments meds updated today with pt and his med list   Physical Exam  Additional Exam:  WD, sl thin, 77 y/o WM in NAD... GENERAL:  Alert & oriented; pleasant & cooperative... HEENT:  [place]/AT, EOM-wnl, PERRLA, EACs-clear, TMs-wnl, NOSE-clear, THROAT-clear & wnl. NECK:  Supple w/ fairROM; no JVD; s/p right CAE, no bruits; no thyromegaly or nodules palpated; no lymphadenopathy. CHEST: decr BS bilat- few rhonchi in RLL area, no consolidation, no wheezing... HEART:  sl irregular rhythm; w/o murmur, rubs, or gallop heard... ABDOMEN:  Soft & nontender; normal bowel sounds; no organomegaly or masses detected. EXT: without deformities, mod  arthritic changes; ambulates w/ walker; +venous stasis, 2+edema noted. NEURO:  CN's intact;  gait abn; no focal neuro deficits,  +generalized weakness... DERM:  No lesions noted; no rash etc...    CXR  Procedure date:  02/02/2010  Findings:      CHEST - 2 VIEW Comparison: 12/02/2011and 11/28/2011and 06/25/2008   Findings: Left PICC line has been removed in the interval.  Right base scarring is not substantially changed since 06/25/2008.  There is some minimal atelectasis or scarring at the left base.  No focal pneumonia or pulmonary edema.  No substantial pleural effusion. The cardiopericardial silhouette is within normal limits for size.  IMPRESSION: Bibasilar scarring, not substantially changed since 06/25/2008.  No acute cardiopulmonary process.   Read By:  Kennith Center,  M.D.   MISC. Report  Procedure date:  02/02/2010  Findings:      BMP (METABOL)   Sodium               [L]  133 mEq/L                   135-145   Potassium                 5.1 mEq/L                   3.5-5.1   Chloride                  101 mEq/L                   96-112   Carbon Dioxide            24 mEq/L                    19-32   Glucose              [H]  105 mg/dL                   40-98   BUN                  [H]  28 mg/dL                    1-19   Creatinine                1.2 mg/dL                   1.4-7.8   Calcium                   9.3 mg/dL                   2.9-56.2   GFR                       64.15 mL/min                >60.00  Hepatic/Liver Function Panel (HEPATIC)   Total Bilirubin           1.0 mg/dL                   1.3-0.8   Direct Bilirubin          0.3 mg/dL                   6.5-7.8   Alkaline Phosphatase      103 U/L                     39-117   AST                       26 U/L                      0-37   ALT                       21 U/L  0-53   Total Protein        [L]  5.4 g/dL                    0.4-5.4   Albumin              [L]  2.9 g/dL                     0.9-8.1  CBC Platelet w/Diff (CBCD)   White Cell Count     [H]  11.9 K/uL                   4.5-10.5   Red Cell Count            4.54 Mil/uL                 4.22-5.81   Hemoglobin                14.9 g/dL                   19.1-47.8   Hematocrit                44.0 %                      39.0-52.0   MCV                       97.0 fl                     78.0-100.0   Platelet Count            190.0 K/uL                  150.0-400.0   Neutrophil %         [H]  81.5 %                      43.0-77.0   Lymphocyte %              13.5 %                      12.0-46.0   Monocyte %                4.5 %                       3.0-12.0   Eosinophils%              0.1 %                       0.0-5.0   Basophils %               0.4 %                       0.0-3.0  Comments:      TSH (TSH)   FastTSH                   1.60 uIU/mL                 0.35-5.50  Sed Rate (ESR)   Sed Rate                  9 mm/hr  0-22   Impression & Recommendations:  Problem # 1:  Follow-up PNEUMONIA, hx RECURRENT BRONCHITIS>>> CXR shows baseline film w/basilar fibrosis, scarring, NAD... rec wean Pred to 5mg /d & ROV 6 weeks for further eval & taper...  Problem # 2:  Hx CELLULITIS>>> Now resolved & pt cautioned about cuts, broken skin, etc...  Problem # 3:  Hx DVT & PTE>>> On Coumadin w/ hx PAF, recurrent DVT & IVC filter in place from 2001...  stable Protimes followed in the CC.  Problem # 4:  HYPERTENSION (ICD-401.9) His BP has been low <100 & he's been weaker... rec stopping Lisinopril... still needs the Lasix w/ 2+pitting edema in LEs... The following medications were removed from the medication list:    Lisinopril 10 Mg Tabs (Lisinopril) .Marland Kitchen... Take 1 tab by mouth once daily... His updated medication list for this problem includes:    Furosemide 40 Mg Tabs (Furosemide) .Marland Kitchen... Take 1 tab by mouth once daily  Orders: T-2 View CXR (71020TC) TLB-BMP (Basic Metabolic Panel-BMET)  (80048-METABOL) TLB-Hepatic/Liver Function Pnl (80076-HEPATIC) TLB-CBC Platelet - w/Differential (85025-CBCD) TLB-TSH (Thyroid Stimulating Hormone) (84443-TSH) TLB-Sedimentation Rate (ESR) (85652-ESR)  Problem # 5:  PAROXYSMAL ATRIAL FIBRILLATION (ICD-427.31) Followed by Walker Kehr for cards on Coumadin Rx...  His updated medication list for this problem includes:    Coumadin 4 Mg Tabs (Warfarin sodium) .Marland Kitchen... Take as directed by the coumadin clinic...    Adult Aspirin Low Strength 81 Mg Tbdp (Aspirin) ..... Once daily  Problem # 6:  PERIPHERAL VASCULAR DISEASE (ICD-443.9) Sent by Walker Kehr after last CT Angio to DrEarly for [facility] eval & opinion... notes reviewed> he has mult areas of ectasia & bilat popliteal art aneurysms... left=  ~5cm but felt to be too high risk for surg, therefore observation alone... Orders: DME Referral (DME)  Problem # 7:  GERD (ICD-530.81) GI is stable>  continue same meds... His updated medication list for this problem includes:    Pepcid Ac 10 Mg Tabs (Famotidine) .Marland Kitchen... As needed    Tums 500 Mg Chew (Calcium carbonate antacid) .Marland Kitchen... As needed  Problem # 8:  Hx of PROSTATE CANCER (ICD-185) GU stable>  we will check f/u labs on return...  Problem # 9:  OTHER MEDICAL PROBLEMS AS NOTED>>> He requests a wheelchair seat cushion... Coumadin 4mg  tabs refilled... Pred decr from 10mg  to 5mg /d... Lisinopril stopped...   Complete Medication List: 1)  Systane 0.4-0.3 % Soln (Polyethyl glycol-propyl glycol) .... Apply one drop in each eye two times a day 2)  Flonase 50 Mcg/act Susp (Fluticasone propionate) .... 2 sprays each nostril once daily 3)  Advair Diskus 250-50 Mcg/dose Misc (Fluticasone-salmeterol) .... Inhale 1 puff two times a day, rinse mouth well. 4)  Prednisone 10 Mg Tabs (Prednisone) .... Take 1/2 tab by mouth once daily.Marland KitchenMarland Kitchen 5)  Mucinex 600 Mg Xr12h-tab (Guaifenesin) .... Take one tablet by mouth two times a day 6)  Coumadin 4 Mg Tabs (Warfarin sodium) ....  Take as directed by the coumadin clinic.Marland KitchenMarland Kitchen 7)  Adult Aspirin Low Strength 81 Mg Tbdp (Aspirin) .... Once daily 8)  Furosemide 40 Mg Tabs (Furosemide) .... Take 1 tab by mouth once daily 9)  Klor-con M20 20 Meq Tbcr (Potassium chloride crys cr) .... Take 1 tablet by mouth once a day 10)  Lipitor 20 Mg Tabs (Atorvastatin calcium) .... Once daily 11)  Pepcid Ac 10 Mg Tabs (Famotidine) .... As needed 12)  Align Caps (Probiotic product) .... Take 1 cap daily... 13)  Senokot S 8.6-50 Mg Tabs (Sennosides-docusate sodium) .... Use  as needed for constipation... 14)  Lactulose 10 Gm/36ml Soln (Lactulose) .... Once daily 15)  Tums 500 Mg Chew (Calcium carbonate antacid) .... As needed 16)  Vitamin D 1000 Unit Tabs (Cholecalciferol) .... Take 1 cap by mouth once daily.... 17)  Oxycodone Hcl 5 Mg Tabs (Oxycodone hcl) .... Take 1 tab every 4-6hr as needed pain  Patient Instructions: 1)  Today we updated your med list- see below.... 2)  We decided to decrease the PREDNISONE to 1/2 tab (=5mg ) daily til return visit.Marland KitchenMarland Kitchen 3)  STOP the Lisinopril (due to your low BP). 4)  Continue your laxative regimen just as you are doing... 5)  We will call [facility] for a wheelchair cushion... 6)  Today we did your follow up CXR & blood work... please call the "phone tree" in a few days for your lab results.Marland KitchenMarland Kitchen 7)  Be sure to avoid salt in your diet, keep your legs elevated as much as poss, wear support hose, etc... 8)  Call for any problems.Marland KitchenMarland Kitchen 9)  Please schedule a follow-up appointment in 6 weeks for recheck... Prescriptions: COUMADIN 4 MG TABS (WARFARIN SODIUM) take as directed by the Coumadin Clinic...  #[phone] x 2   Entered and Authorized by:   Michele Mcalpine MD   Signed by:   Michele Mcalpine MD on 02/02/2010   Method used:   Print then Give to Patient   RxID:   [id number]

## 2010-03-11 NOTE — Progress Notes (Signed)
Summary: orders for PT and INR  Phone Note Other Incoming Call back at [phone]   Caller: Dois Davenport with [facility] Summary of Call: Dois Davenport with [facility] phoned she needed orders and frequency for Glenn Barron's PT and INR. This was last drawn on 01/25/10. She can be reached at [phone] Initial call taken by: Vedia Coffer,  January 26, 2010 1:36 PM  Follow-up for Phone Call        SN, pls advise how often PT/INR should be checked.  Thanks Vernie Murders  January 26, 2010 3:05 PM Dois Davenport with [facility] called again stated that she has not received a return call regarding Glenn Barron PT and INR. Please call her at [phone]  Additional Follow-up for Phone Call Additional follow up Details #1::        per SN----call and speak with sandra from [facility] and have them call the results to the CC since the pt has been followed there for his protime checks.  sandra will call there and relay this info to them and get further testing orders from them Randell Loop CMA  January 26, 2010 4:14 PM

## 2010-03-11 NOTE — Medication Information (Signed)
Summary: Coumadin Clinic   Anticoagulant Therapy  Managed by: Weston Brass, PharmD Referring MD: Charlton Haws MD Supervising MD: Graciela Husbands MD, Viviann Spare Indication 1: Deep Vein Thrombosis - Leg (ICD-451.1) Indication 2: Pulmonary Embolism and Infarction (ICD-415.1) Lab Used: [facility] Site: [address] PT 18.3 INR POC 2.2 INR RANGE 2 - 3  Dietary changes: no    Health status changes: no    Bleeding/hemorrhagic complications: no    Recent/future hospitalizations: no    Any changes in medication regimen? yes       Details: was possibly on abx but unsure name or what it is for.   Recent/future dental: no  Any missed doses?: no       Is patient compliant with meds? yes       Allergies: 1)  Amoxicillin  Anticoagulation Management History:      His anticoagulation is being managed by telephone today.  Positive risk factors for bleeding include an age of 75 years or older and history of CVA/TIA.  The bleeding index is 'intermediate risk'.  Positive CHADS2 values include History of CHF, History of HTN, Age > 58 years old, and Prior Stroke/CVA/TIA.  The start date was 12/22/1997.  Prothrombin time is 18.3.  Anticoagulation responsible provider: Graciela Husbands MD, Viviann Spare.  INR POC: 2.2.  Exp: 01/2011.    Anticoagulation Management Assessment/Plan:      The patient's current anticoagulation dose is Coumadin 5 mg  tabs: take as directed.  The target INR is 2.0-3.0.  The next INR is due 02/16/2010.  Anticoagulation instructions were given to patient.  Results were reviewed/authorized by Weston Brass, PharmD.  He was notified by Weston Brass PharmD.         Prior Anticoagulation Instructions: INR 2.1  Continue same dose of 4mg  every day.  Recheck INR in 1 week.  Orders given to RN with [facility].   Current Anticoagulation Instructions: INR 2.2  Spoke with pt.  Continue same dose of 4 mg daily.  Recheck INR in 2 weeks. Gave orders to [place] with [facility].

## 2010-03-11 NOTE — Miscellaneous (Signed)
Summary: Order for wheelchair cushion/[facility]  Order for wheelchair cushion/[facility]   Imported By: Sherian Rein 03/02/2010 13:39:03  _____________________________________________________________________  External Attachment:    Type:   Image     Comment:   External Document

## 2010-03-11 NOTE — Progress Notes (Signed)
Summary: still waiting for rx's  Phone Note Call from Patient Call back at Home Phone [phone]   Caller: Patient Call For: Glenn Barron Summary of Call: pt says he just finished talking to someone at [facility] [address] ([address] he was told that they have not received a rx for warfarin OR prednisone. wants this called in asap. (pt was seen 12/27).  Initial call taken by: Tivis Ringer, CNA,  February 04, 2010 1:17 PM  Follow-up for Phone Call        refill sent. pt awre.Carron Curie CMA  February 04, 2010 1:24 PM     Prescriptions: COUMADIN 4 MG TABS (WARFARIN SODIUM) take as directed by the Coumadin Clinic...  #100 x 2   Entered by:   Carron Curie CMA   Authorized by:   Michele Mcalpine MD   Signed by:   Carron Curie CMA on 02/04/2010   Method used:   Electronically to        [facility] [phone]* (retail)       [address] [place], [place]  [address]       Ph: [phone] or [phone]       Fax: [phone]   RxID:   [id number] PREDNISONE 10 MG TABS (PREDNISONE) take 1/2 tab by mouth once daily...  #30 x 0   Entered by:   Carron Curie CMA   Authorized by:   Michele Mcalpine MD   Signed by:   Carron Curie CMA on 02/04/2010   Method used:   Electronically to        [facility] [phone]* (retail)       [address] [place], [place]  [address]       Ph: [phone] or [phone]       Fax: [phone]   RxID:   [id number]

## 2010-03-11 NOTE — Progress Notes (Signed)
Summary: warfarin rx  Phone Note Call from Patient Call back at Home Phone [phone]   Caller: Patient Call For: nadel Summary of Call: pt requests rx for 4mg  warfarin- [facility] [address] Initial call taken by: Tivis Ringer, CNA,  February 04, 2010 10:08 AM  Follow-up for Phone Call        Patient aware RX sent for Warfarin 4mg  after OV on 02/02/2010.  Follow-up by: Michel Bickers CMA,  February 04, 2010 10:38 AM

## 2010-03-17 ENCOUNTER — Ambulatory Visit: Payer: Self-pay | Admitting: Pulmonary Disease

## 2010-03-17 NOTE — Miscellaneous (Signed)
Summary: Orders/[facility]  Orders/[facility]   Imported By: Sherian Rein 03/12/2010 12:21:43  _____________________________________________________________________  External Attachment:    Type:   Image     Comment:   External Document

## 2010-03-18 ENCOUNTER — Encounter: Payer: Self-pay | Admitting: Internal Medicine

## 2010-03-18 ENCOUNTER — Encounter (INDEPENDENT_AMBULATORY_CARE_PROVIDER_SITE_OTHER): Payer: Medicare Other

## 2010-03-18 DIAGNOSIS — Z7901 Long term (current) use of anticoagulants: Secondary | ICD-10-CM

## 2010-03-18 DIAGNOSIS — I80299 Phlebitis and thrombophlebitis of other deep vessels of unspecified lower extremity: Secondary | ICD-10-CM

## 2010-03-22 ENCOUNTER — Encounter: Payer: Self-pay | Admitting: Pulmonary Disease

## 2010-03-22 ENCOUNTER — Ambulatory Visit (INDEPENDENT_AMBULATORY_CARE_PROVIDER_SITE_OTHER): Payer: Medicare Other | Admitting: Pulmonary Disease

## 2010-03-22 DIAGNOSIS — I4891 Unspecified atrial fibrillation: Secondary | ICD-10-CM

## 2010-03-22 DIAGNOSIS — I2699 Other pulmonary embolism without acute cor pulmonale: Secondary | ICD-10-CM

## 2010-03-22 DIAGNOSIS — I10 Essential (primary) hypertension: Secondary | ICD-10-CM

## 2010-03-22 DIAGNOSIS — I679 Cerebrovascular disease, unspecified: Secondary | ICD-10-CM

## 2010-03-22 DIAGNOSIS — Z9889 Other specified postprocedural states: Secondary | ICD-10-CM

## 2010-03-22 DIAGNOSIS — J42 Unspecified chronic bronchitis: Secondary | ICD-10-CM

## 2010-03-22 DIAGNOSIS — I82409 Acute embolism and thrombosis of unspecified deep veins of unspecified lower extremity: Secondary | ICD-10-CM | POA: Insufficient documentation

## 2010-03-22 DIAGNOSIS — I509 Heart failure, unspecified: Secondary | ICD-10-CM

## 2010-03-22 DIAGNOSIS — R609 Edema, unspecified: Secondary | ICD-10-CM

## 2010-03-25 NOTE — Medication Information (Signed)
Summary: Coumadin Clinic  Anticoagulant Therapy  Managed by: Windell Hummingbird, RN Referring MD: Charlton Haws MD Supervising MD: Johney Frame MD, Fayrene Fearing Indication 1: Deep Vein Thrombosis - Leg (ICD-451.1) Indication 2: Pulmonary Embolism and Infarction (ICD-415.1) Lab Used: [facility] [place] Site: [address] INR POC 1.6 INR RANGE 2 - 3  Dietary changes: no    Health status changes: no    Bleeding/hemorrhagic complications: no    Recent/future hospitalizations: no    Any changes in medication regimen? no    Recent/future dental: no  Any missed doses?: no       Is patient compliant with meds? yes       Allergies: 1)  Amoxicillin  Anticoagulation Management History:      The patient is taking warfarin and comes in today for a routine follow up visit.  Positive risk factors for bleeding include an age of 75 years or older and history of CVA/TIA.  The bleeding index is 'intermediate risk'.  Positive CHADS2 values include History of CHF, History of HTN, Age > 48 years old, and Prior Stroke/CVA/TIA.  The start date was 12/22/1997.  Anticoagulation responsible provider: Isabela Nardelli MD, Fayrene Fearing.  INR POC: 1.6.  Cuvette Lot#: [id number].  Exp: 02/2011.    Anticoagulation Management Assessment/Plan:      The patient's current anticoagulation dose is Coumadin 4 mg tabs: take as directed by the Coumadin Clinic....  The target INR is 2.0-3.0.  The next INR is due 04/01/2010.  Anticoagulation instructions were given to patient.  Results were reviewed/authorized by Windell Hummingbird, RN.  He was notified by Windell Hummingbird, RN.         Prior Anticoagulation Instructions: INR 2.5 Continue 4mg s everyday. Recheck in 4 weeks.  HH discharging appt s/c for office. HH nurse made pt aware.   Current Anticoagulation Instructions: INR 1.6 Take 1 1/2 tablets today and tomorrow. Then resume 1 tablet everyday.   Recheck in 2 weeks.

## 2010-03-25 NOTE — Miscellaneous (Signed)
Summary: Order/[facility]  Order/[facility]   Imported By: Lester Batavia 03/16/2010 08:01:34  _____________________________________________________________________  External Attachment:    Type:   Image     Comment:   External Document

## 2010-03-31 NOTE — Assessment & Plan Note (Signed)
Summary: 6 wk f/u//la   CC:  6 week ROV & review....  History of Present Illness: 77 y/o WM here for a follow up visit... he has mult med problems as noted below... Followed for general medical purposes w/ hx of recurrent bronchitis & allergies; hx PTE/ DVT/ & IVC filter on Coumadin; HBP; AFib; cerebrovasc dis w/ hx stroke & prev right CAE; ASPVD w/ prev AAA repair & subseq bilat Ao-iliac bypasses; Hyperchol; Prostate cancer; neuropathy; and anxiety/ depression...   ~  Mar11:  he saw [place] for Cards f/u 12/10- Sotolol stopped due to [place] on EKG, he continues on Coumadin thru CC- doing satis... he was hosp by [facility] 2/11 for cellulitis left hand ?etiology- treated w/ IV Vanco & Clinda & resolved [employer], XRays & Labs reviewed)... he was disch to [facility] for rehab- & ret home 04/08/09... still weak, has help at home, doing exercises...   ~  Jul11:  he saw DrNishan 6/11- stable, w/ f/u LE Dopplers (patent Ao-iliac bypass, ?>50% right SFAstenosis, left side OK)... he is still weak, doing his own exercises at home- offered more PT but he declines... notes some incr swelling since he cut his Lasix- now back to 1 tab daily & thinks he might need more on occas... we discussed f/u CXR (chr changes, NAD) & LABS (generally stable, NAD).Marland Kitchen.   ~  February 02, 2010:  DrNishan sent him to [facility]- DrEarly 8/11 w/ diffuse aneurysmal changes (s/p AAA repair 2000 & right CAE 2001, hx DVT & IVC filter on Coumadin);  of greatest concern is  ~5cm left pop art aneurysm but he is felt to be too high risk for surg therefore rec observation alone & he will f/u w/ Walker Kehr for [employer]...  he was hosp by [facility] 11/11 for right arm cellulitis & ?pneumonia> treated w/ Vanco/ Avelox (cults neg, pos MRSA screen)... he went to [facility] for rehab now back home- lives alone w/ help 8H per day (son has Parkinson's, & pt won't move to [place] w/ daugh)... he has mult somatic complaints> eyes "brushed by DrGigengack at [facility]";  weak & BP low; constipated; legs swelling; wants cushion for wheelchair; etc...    ~  March 22, 2010:   6 wk ROV- still weak but improving slowly...    He has mult somatic complaints but overall appears stable;  he is in AFib clinically w/ controlled VR & HR=90, continues on Coumadin via CC;  he has persist LE edema w/ pitting & wt up 3#> on Lasix 40mg /d, states he's restricting sodium (but eating TV dinners), elevating legs, wearing support hose;  BP well controlled at 116/80 & we discussed 2gm sodium restriction & incr Lasix 40-80mg  daily for the edema...    Breathing is stable & he's been on Pred 10mg - 1/2 daily;  we discussed weaning to 5mg  Qod, continue Advair, etc...    He has a prob w/ his right hand> can't dorsiflex the 3rd-4th fingers & has appt w/ DrWeingold to eval...    He notes some constipation & we have rec Miralax daily if necessary for regulation, along w/ his Senakot-S, Align, etc...   Current Problem List:  OPHTHALMOLOGY - followed by [facility]...  ALLERGIC RHINITIS (ICD-477.9) - on ALLEGRA 180mg  Prn and FLONASE Qhs...  BRONCHITIS, RECURRENT (ICD-491.9) - on ADVAIR 250Bid... stable- mild cough/ plhegm/ no change in dyspnea.  ~  CXR 7/11 showed some hyperinflation, no change scarring & vol loss RLL, NAD.Marland Kitchen.  ~  CXR 11/11 in hosp showed  scarring right base w/ incr opac r/o superimposed pneum > he was placed on Pred during this adm & weaned slowly as outpt...  ~  CXR 12/11 here showed baseline film w/ basilar scarring & NAD... on Pred 10mg /d & asked to wean to 5mg /d...  Hx of PULMONARY EMBOLISM (ICD-415.19) - he had a DVT w/ PTE in 2001 w/ IVC filter placed... he remains on COUMADIN w/ regular checks in the Coumadin Clinic- doing well.  HYPERTENSION (ICD-401.9) - on LISINOPRIL 10mg /d, & LASIX 40mg  daily w/ K20/d... BP= 116/80 today, & similar at home... takes meds regularly & tolerating well... he notes some fatigue, intermittent dyspnea & dizziness; but denies HA, visual  changes, CP, palipit, syncope, edema, etc...   ~  labs 10/10 showed BUN= 17, Creat= 1.0, K= 4.7  ~  11/10: c/o incr dizzy & weak- rec to decr the Lasix to 1/2 tab daily...  ~  2/11:  hosp records show norm CBC, BMet, TSH, Urine.  ~  7/11: c/o incr edema & wants to incr Lasix to 1-2 Qam; BUN=28, Creat=1.2, K=4.2, BNP=97  ~  Labs 11/11 in hosp on Lasix 40mg /d showed BUN/ Creat= 29/ 1.5 ==> 18/ 0.9 & BNP= 113.  PAROXYSMAL ATRIAL FIBRILLATION (ICD-427.31) - prev on Sotalol80 but stopped 12/10 by DrNishan...  remains on Coumadin via clinic... he was prev maintaining NSR but has slipped back into AFib w/ controlled VR...  ~  EKG 12/10 showed SBrady w/ RBBB & 1st degree AVB...  ~  6/11:  he was still regular w/ pulse= 62 when seen by [facility] 6/11...  ~  Noted to be irreg 12/11 OV & clinical AFib w/ conrolled VR 2/12 OV  CEREBROVASCULAR DISEASE (ICD-437.9) - on ASA 81mg /d in addition to the Coumadin...   ~  he is s/p right carotid endarterectomy 7/01 by Windell Moulding...   ~  MRI 8/04 showed intracranial atherosclerotic changes w/ marked ectasia of V-B sys, sm right vertebral w/ prox stenosis, & >50% left ICA stenosis...   ~  CT Brain 1/09 showed atrophy and chr microvacs ischemic changes & remote IC lacune...  ~  CDoppler 5/09 showed patent right CAE w/ DPA, mild plaque in bulb, 0-39% bilat ICA stenoses- no change...  ~  CDoppler 5/10 showed patent right CAE w/ DPA, stable mild left carotid dis, 0-39% bilat- f/u 31yr.  PERIPHERAL VASCULAR DISEASE (ICD-443.9) - s/p AAA repair 8/00 & bilat Ao to iliac bypasses in 2001 by DrLawson... he had mult post-op complications and a long hosp course... vasc f/u by [facility] for [place] [facility]...  ~  2011:  f/u vasc eval by Walker Kehr & referral to DrEarly for review> diffuse aneurysmal changes w/ 5cm left Pop art aneurysm- he was felt to be too high risk for surg & they rec BP control & observation...   VENOUS INSUFFICIENCY, CHRONIC (ICD-459.81) - chr ven insuffic  due to his mult DVT episodes & prev IVC filter ?2001... he's had incr trouble w/ LE edema recently & requires incr Lasix dosing...  ~  Venous Dopplers right leg 8/09 showed no evid for DVT, no superfic clots, etc...  ~  8/09 developed cellulitis right leg which resolved to Keflex & local care...  ~  [facility] 2/11 by [facility] for left hand cellulitis...  ~  [facility] 11/11 by [facility] for right arm cellulitis...  ~  2/12:  exam w/ persist incr LE edema w/ pitting> rec to incr LASIX 40-80mg /d.  HYPERLIPIDEMIA (ICD-272.4) - on LIPITOR 20mg /d & prev FLP at goal  on this dose...  ~  FLP 5/08 showed TChol 124, Tg 99, HDL 40, LDL 65  ~  FLP 5/09 showed TChol 120, TG 93, HDL 36, LDL 66  ~  FLP 10/10 showed TChol 123, TG 96, HDL 47, LDL 57  GERD (ICD-530.81) - on PEPCID 20mg /d...  DIVERTICULOSIS OF COLON (ICD-562.10) IRRITABLE BOWEL SYNDROME (ICD-564.1) - he has diarrhea (uses Immodium) & constipation (uses Miralax/ Senakot-S) w/ a tough job regulating (uses ALIGN)... COLONIC POLYPS (ICD-211.3) - last colonoscopy 8/99 showed divertics, no recurrent polyps... last polyps removed 1996= adenomatous... also had incidental cecal lipoma seen.  Hx of PROSTATE CANCER (ICD-185) - s/p radical prostatectomy in 1994... he is followed by DrWrenn & has urinary incontinence for which he uses pads etc...  ~  labs 10/10 showed PSA= 0.03  ~  labs 7/11 showed PSA= 0.04  DEGENERATIVE JOINT DISEASE (ICD-715.90) - he has a known lumbar disc disease and cervical spondylosis... he also takes Vit D 1000 u daily...   ~  labs 7/11 showed Vit D level = 30... continue OTC supplement.  Hx of STROKE (ICD-434.91) - he was hospitalized 8/04 w/ stroke and MRI showed acute left pontine infarct + diffuse intracranial atherosclerotic changes- see above- on ASA & Coumadin.  PERIPHERAL NEUROPATHY (ICD-356.9) - he has LBP, neuropathy and a gait abnormality...  ANXIETY DEPRESSION (ICD-300.4) - wife, Kathie Rhodes, died Jun 24, 2022 on hospice (severe pulm fibrosis from  scleroderma/ CREST/ etc)...   Preventive Screening-Counseling & Management  Alcohol-Tobacco     Smoking Status: never  Allergies: 1)  Amoxicillin  Comments:  Nurse/Medical Assistant: The patient's medications and allergies were reviewed with the patient and were updated in the Medication and Allergy Lists.  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) HYPERTENSION (ICD-401.9) PAROXYSMAL ATRIAL FIBRILLATION (ICD-427.31) CEREBROVASCULAR DISEASE (ICD-437.9) PERIPHERAL VASCULAR DISEASE (ICD-443.9) VENOUS INSUFFICIENCY, CHRONIC (ICD-459.81) DVT, HX OF (ICD-453.40) HYPERLIPIDEMIA (ICD-272.4) GERD (ICD-530.81) DIVERTICULOSIS OF COLON (ICD-562.10) IRRITABLE BOWEL SYNDROME (ICD-564.1) COLONIC POLYPS (ICD-211.3) Hx of PROSTATE CANCER (ICD-185) DEGENERATIVE JOINT DISEASE (ICD-715.90) Hx of STROKE (ICD-434.91) PERIPHERAL NEUROPATHY (ICD-356.9) ANXIETY DEPRESSION (ICD-300.4)  Past Surgical History: NEOPLASM, MALIGNANT, PROSTATE, S/P PROSTATECTOMY, RAD (ICD-V10.46) CATARACT EXTRACTION, HX OF (ICD-V45.61) INGUINAL HERNIORRHAPHIES, BILATERAL, HX OF (ICD-V45.89) CAROTID ENDARTERECTOMY, RIGHT, HX OF (ICD-V15.1) ABDOMINAL AORTIC ANEURYSM REPAIR, HX OF (ICD-V15.1)  Family History: Reviewed history from 06/26/2007 and no changes required. father deceased at age 60 from vessel hemorrhage mother deceased at age 31--broken hip--system shut down  Social History: Reviewed history from 06/25/2008 and no changes required. Widow- wife [name] died 24-Jun-2022 3 children pt is a chemist--retired non-smoker quit drinking 10 years ago  Review of Systems      See HPI       The patient complains of decreased hearing, dyspnea on exertion, peripheral edema, muscle weakness, difficulty walking, and depression.  The patient denies anorexia, fever, weight loss, weight gain, vision loss, hoarseness, chest pain,  syncope, prolonged cough, headaches, hemoptysis, abdominal pain, melena, hematochezia, severe indigestion/heartburn, hematuria, incontinence, suspicious skin lesions, transient blindness, unusual weight change, abnormal bleeding, enlarged lymph nodes, and angioedema.    Vital Signs:  Patient profile:   77 year old male Height:      72 inches Weight:      170.38 pounds O2 Sat:      99 % on Room air Temp:     98.7 degrees F oral Pulse rate:   92 / minute BP sitting:   116 / 80  (left arm) Cuff  size:   regular  Vitals Entered By: Randell Loop CMA (March 22, 2010 3:21 PM)  O2 Sat at Rest %:  99 O2 Flow:  Room air CC: 6 week ROV & review... Is Patient Diabetic? No Pain Assessment Patient in pain? no      Comments meds updated today with pt   Physical Exam  Additional Exam:  WD, sl thin, 77 y/o WM in NAD... GENERAL:  Alert & oriented; pleasant & cooperative... HEENT:  [place]/AT, EOM-wnl, PERRLA, EACs-clear, TMs-wnl, NOSE-clear, THROAT-clear & wnl. NECK:  Supple w/ fairROM; no JVD; s/p right CAE, no bruits; no thyromegaly or nodules palpated; no lymphadenopathy. CHEST: decr BS bilat- few rhonchi in RLL area, no consolidation, no wheezing... HEART:  sl irregular rhythm; w/o murmur, rubs, or gallop heard... ABDOMEN:  Soft & nontender; normal bowel sounds; no organomegaly or masses detected. EXT: without deformities, mod arthritic changes; ambulates w/ walker; +venous stasis, 2+edema noted. NEURO:  CN's intact;  gait abn; no focal neuro deficits,  +generalized weakness... DERM:  No lesions noted; no rash etc...    Impression & Recommendations:  Problem # 1:  BRONCHITIS, RECURRENT (ICD-491.9) He's been stable since his hosp on Pred taper + Advair250Bid... rec to decr Pred further to 5mg  Qod & continue regular dosing of the Advair.  Problem # 2:  Hx of PULMONARY EMBOLISM (ICD-415.19) He has hx DVT. PE, chr AFib on Coumadin... he has IVC filter in place as well... His updated  medication list for this problem includes:    Coumadin 4 Mg Tabs (Warfarin sodium) .Marland Kitchen... Take as directed by the coumadin clinic...    Adult Aspirin Low Strength 81 Mg Tbdp (Aspirin) ..... Once daily  Problem # 3:  HYPERTENSION (ICD-401.9) BP controlled>  continue 2gm sodium + lasix, etc... His updated medication list for this problem includes:    Furosemide 40 Mg Tabs (Furosemide) .Marland Kitchen... Take 1-2 tabs by mouth once daily  Problem # 4:  PAROXYSMAL ATRIAL FIBRILLATION (ICD-427.31) He appears to be back in AFib w/ controlled VR... on Coumadin... due for Cards f/u DrNishan. His updated medication list for this problem includes:    Coumadin 4 Mg Tabs (Warfarin sodium) .Marland Kitchen... Take as directed by the coumadin clinic...    Adult Aspirin Low Strength 81 Mg Tbdp (Aspirin) ..... Once daily  Problem # 5:  PERIPHERAL VASCULAR DISEASE (ICD-443.9) Eval from [facility] & DrEarly reviewed... clinicxally stable, BP controlled, continue Rx.  Problem # 6:  VENOUS INSUFFICIENCY, CHRONIC (ICD-459.81) A major prob w/ LE edema... discussed low sodium (no TV dinners), elevation, support hose, etc... OK to incr Lasix 40-80 Qam but he is reluctant due to urination...  Problem # 7:  MULT OTHER MEDICAL PROBLEMS>>> As noted...  Complete Medication List: 1)  Systane 0.4-0.3 % Soln (Polyethyl glycol-propyl glycol) .... Apply one drop in each eye two times a day 2)  Flonase 50 Mcg/act Susp (Fluticasone propionate) .... 2 sprays each nostril once daily 3)  Advair Diskus 250-50 Mcg/dose Misc (Fluticasone-salmeterol) .... Inhale 1 puff two times a day, rinse mouth well. 4)  Prednisone 10 Mg Tabs (Prednisone) .... Take 1/2 tab by mouth every other day 5)  Coumadin 4 Mg Tabs (Warfarin sodium) .... Take as directed by the coumadin clinic.Marland KitchenMarland Kitchen 6)  Adult Aspirin Low Strength 81 Mg Tbdp (Aspirin) .... Once daily 7)  Furosemide 40 Mg Tabs (Furosemide) .... Take 1-2 tabs by mouth once daily 8)  Klor-con M20 20 Meq Tbcr (Potassium  chloride crys cr) .... Take 1 tablet by  mouth once a day 9)  Lipitor 20 Mg Tabs (Atorvastatin calcium) .... Once daily 10)  Pepcid Ac 10 Mg Tabs (Famotidine) .... Take one tablet by mouth at bedtime 11)  Align Caps (Probiotic product) .... Take 1 cap daily... 12)  Senokot S 8.6-50 Mg Tabs (Sennosides-docusate sodium) .... Use as needed for constipation... 13)  Lactulose 10 Gm/45ml Soln (Lactulose) .... Once daily 14)  Tums 500 Mg Chew (Calcium carbonate antacid) .... As needed 15)  Vitamin D 1000 Unit Tabs (Cholecalciferol) .... Take 1 cap by mouth once daily.... 16)  Oxycodone Hcl 5 Mg Tabs (Oxycodone hcl) .... Take 1 tab every 4-6hr as needed pain as needed only per pt  Patient Instructions: 1)  Today we updated your med list- see below.... 2)  We decided to decrease the Prednisone to 1/2 tab every other day until return... 3)  REMEMBER:  No salt/ sodium in diet... elevate your legs... wear support hose etc... 4)  We also gave you permission to increase your Furosemide diuretic from one to two tabs as needed for the swelling in your legs.Marland KitchenMarland Kitchen 5)  Call for any questions.Marland KitchenMarland Kitchen 6)  Let's plan a follow up visit in 2 months.Marland KitchenMarland Kitchen

## 2010-03-31 NOTE — Miscellaneous (Signed)
Summary: [facility]  Care [facility]   Imported By: Sherian Rein 03/24/2010 11:17:19  _____________________________________________________________________  External Attachment:    Type:   Image     Comment:   External Document

## 2010-04-01 ENCOUNTER — Encounter (INDEPENDENT_AMBULATORY_CARE_PROVIDER_SITE_OTHER): Payer: Medicare Other

## 2010-04-01 ENCOUNTER — Encounter: Payer: Self-pay | Admitting: Internal Medicine

## 2010-04-01 DIAGNOSIS — I2699 Other pulmonary embolism without acute cor pulmonale: Secondary | ICD-10-CM

## 2010-04-01 DIAGNOSIS — I80299 Phlebitis and thrombophlebitis of other deep vessels of unspecified lower extremity: Secondary | ICD-10-CM

## 2010-04-01 DIAGNOSIS — Z7901 Long term (current) use of anticoagulants: Secondary | ICD-10-CM

## 2010-04-01 LAB — CONVERTED CEMR LAB: POC INR: 1.7

## 2010-04-06 NOTE — Medication Information (Signed)
Summary: rov/tm   Anticoagulant Therapy  Managed by: Weston Brass, PharmD Referring MD: Charlton Haws MD Supervising MD: Ladona Ridgel MD, Sharlot Gowda Indication 1: Deep Vein Thrombosis - Leg (ICD-451.1) Indication 2: Pulmonary Embolism and Infarction (ICD-415.1) Lab Used: [facility] Site: [address] INR POC 1.7 INR RANGE 2 - 3  Dietary changes: no    Health status changes: no    Bleeding/hemorrhagic complications: no    Recent/future hospitalizations: no    Any changes in medication regimen? yes       Details: taking some mucinex for congestion.   Recent/future dental: no  Any missed doses?: no       Is patient compliant with meds? yes       Allergies: 1)  Amoxicillin  Anticoagulation Management History:      The patient is taking warfarin and comes in today for a routine follow up visit.  Positive risk factors for bleeding include an age of 75 years or older and history of CVA/TIA.  The bleeding index is 'intermediate risk'.  Positive CHADS2 values include History of CHF, History of HTN, Age > 75 years old, and Prior Stroke/CVA/TIA.  The start date was 12/22/1997.  Anticoagulation responsible provider: Ladona Ridgel MD, Sharlot Gowda.  INR POC: 1.7.  Cuvette Lot#: [id number].  Exp: 02/2011.    Anticoagulation Management Assessment/Plan:      The patient's current anticoagulation dose is Coumadin 4 mg tabs: take as directed by the [facility]....  The target INR is 2.0-3.0.  The next INR is due 04/19/2010.  Anticoagulation instructions were given to patient.  Results were reviewed/authorized by Weston Brass, PharmD.  He was notified by Weston Brass PharmD.         Prior Anticoagulation Instructions: INR 1.6 Take 1 1/2 tablets today and tomorrow. Then resume 1 tablet everyday.   Recheck in 2 weeks.  Current Anticoagulation Instructions: INR 1.7  Take 1 1/2 tablets today then increase dose to 1 tablet every day except 1 1/2 tablets on Monday.  Recheck INR in 2-3 weeks.

## 2010-04-07 ENCOUNTER — Encounter: Payer: Self-pay | Admitting: Pulmonary Disease

## 2010-04-07 ENCOUNTER — Telehealth (INDEPENDENT_AMBULATORY_CARE_PROVIDER_SITE_OTHER): Payer: Self-pay | Admitting: *Deleted

## 2010-04-08 ENCOUNTER — Telehealth (INDEPENDENT_AMBULATORY_CARE_PROVIDER_SITE_OTHER): Payer: Self-pay | Admitting: *Deleted

## 2010-04-14 ENCOUNTER — Encounter: Payer: Self-pay | Admitting: Pulmonary Disease

## 2010-04-15 NOTE — Progress Notes (Signed)
Summary: questions re: abx  Phone Note Call from Patient Call back at [facility] Phone [phone]   Caller: Patient Call For: nadel Summary of Call: pt has concenrs re: side effect of moxifloxacin. says he read that this could cause problems with his tendons. he is NOT having any problems yet, but is concerned that this may come up and wants to speak to nurse.  Initial call taken by: Tivis Ringer, CNA,  April 08, 2010 11:41 AM  Follow-up for Phone Call        Spoke with pt and he states concerned about s/e of avelox.  He is worried about tendon and muscle aches.  He states that he feels good, not having any issues and has taken 2 doses already.  He states that his cough is already improved.  I advised that all meds have some s/e and SN had prescribed the med b/c he felt the benefits outweighed risks.  I advised that he should cont to take med, and call here if has any problems.  He verbalized understanding.  Follow-up by: Vernie Murders,  April 08, 2010 2:14 PM

## 2010-04-15 NOTE — Progress Notes (Signed)
Summary: would like antibiotic called in  Phone Note Call from Patient   Caller: Patient Call For: NADEL Summary of Call: Patient phoned stated that he still has a presistant cough and he is still coughing up a greenish sometimes gray mucas. He doesnt have a fever and his appetite is good he would like an antibiotic called into [facility] on [address]. he can be reached at  [phone] Initial call taken by: Vedia Coffer,  April 07, 2010 4:04 PM  Follow-up for Phone Call        Spoke with pt.  He is c/o cough that he has had " for a while". He states that cough is prod is prod with green yellow sputum x 2 wks. Denies any fever or other c/o's.  Requesting abx.  Pls advise thanks Allergic to amoxcicillin Follow-up by: Vernie Murders,  April 07, 2010 4:17 PM  Additional Follow-up for Phone Call Additional follow up Details #1::        per SN---ok for pt to have avelox 400mg   #7  1 by mouth once daily until gone. thanks Randell Loop CMA  April 07, 2010 5:15 PM     Additional Follow-up for Phone Call Additional follow up Details #2::    Spoke with pt and notified that rx for avelox has been sent to pharm. Pt verbalized understanding.  Follow-up by: Vernie Murders,  April 07, 2010 5:19 PM  New/Updated Medications: AVELOX 400 MG TABS (MOXIFLOXACIN HCL) 1 by mouth once daily Prescriptions: AVELOX 400 MG TABS (MOXIFLOXACIN HCL) 1 by mouth once daily  #7 x 0   Entered by:   Vernie Murders   Authorized by:   Michele Mcalpine MD   Signed by:   Vernie Murders on 04/07/2010   Method used:   Electronically to        [facility] [phone]* (retail)       [address] [place], [place]  [address]       Ph: [phone] or [phone]       Fax: [phone]   RxID:   [id number]

## 2010-04-19 ENCOUNTER — Encounter (INDEPENDENT_AMBULATORY_CARE_PROVIDER_SITE_OTHER): Payer: Medicare Other

## 2010-04-19 ENCOUNTER — Encounter: Payer: Self-pay | Admitting: Cardiology

## 2010-04-19 DIAGNOSIS — I80299 Phlebitis and thrombophlebitis of other deep vessels of unspecified lower extremity: Secondary | ICD-10-CM

## 2010-04-19 DIAGNOSIS — Z7901 Long term (current) use of anticoagulants: Secondary | ICD-10-CM

## 2010-04-19 DIAGNOSIS — I2699 Other pulmonary embolism without acute cor pulmonale: Secondary | ICD-10-CM

## 2010-04-19 LAB — PROTIME-INR
INR: 2.03 — ABNORMAL HIGH (ref 0.00–1.49)
INR: 2.52 — ABNORMAL HIGH (ref 0.00–1.49)
INR: 2.53 — ABNORMAL HIGH (ref 0.00–1.49)
INR: 2.72 — ABNORMAL HIGH (ref 0.00–1.49)
Prothrombin Time: 27.3 seconds — ABNORMAL HIGH (ref 11.6–15.2)
Prothrombin Time: 27.4 seconds — ABNORMAL HIGH (ref 11.6–15.2)

## 2010-04-19 LAB — CONVERTED CEMR LAB: POC INR: 2.3

## 2010-04-19 LAB — CBC
Hemoglobin: 13.3 g/dL (ref 13.0–17.0)
MCH: 31.9 pg (ref 26.0–34.0)
MCHC: 33.8 g/dL (ref 30.0–36.0)
MCV: 94.1 fL (ref 78.0–100.0)
Platelets: 182 10*3/uL (ref 150–400)
Platelets: 188 10*3/uL (ref 150–400)
RBC: 4.17 MIL/uL — ABNORMAL LOW (ref 4.22–5.81)
RDW: 13.7 % (ref 11.5–15.5)
WBC: 12.4 10*3/uL — ABNORMAL HIGH (ref 4.0–10.5)
WBC: 16.4 10*3/uL — ABNORMAL HIGH (ref 4.0–10.5)

## 2010-04-19 LAB — BASIC METABOLIC PANEL
BUN: 30 mg/dL — ABNORMAL HIGH (ref 6–23)
CO2: 24 mEq/L (ref 19–32)
CO2: 25 mEq/L (ref 19–32)
Calcium: 8.5 mg/dL (ref 8.4–10.5)
Chloride: 109 mEq/L (ref 96–112)
Creatinine, Ser: 0.91 mg/dL (ref 0.4–1.5)
Creatinine, Ser: 1.09 mg/dL (ref 0.4–1.5)
GFR calc Af Amer: >60 mL/min (ref >60)
GFR calc Af Amer: >60 mL/min (ref >60)
Potassium: 4.5 mEq/L (ref 3.5–5.1)
Sodium: 138 mEq/L (ref 135–145)

## 2010-04-19 LAB — MRSA PCR SCREENING: MRSA by PCR: POSITIVE — AB

## 2010-04-20 LAB — URINALYSIS, ROUTINE W REFLEX MICROSCOPIC
Nitrite: NEGATIVE
Specific Gravity, Urine: 1.017 (ref 1.005–1.030)
Urobilinogen, UA: 1 mg/dL (ref 0.0–1.0)
pH: 7.5 (ref 5.0–8.0)

## 2010-04-20 LAB — CULTURE, BLOOD (ROUTINE X 2): Culture: NO GROWTH

## 2010-04-20 LAB — PROTIME-INR
INR: 1.44 (ref 0.00–1.49)
INR: 1.55 — ABNORMAL HIGH (ref 0.00–1.49)
INR: 1.74 — ABNORMAL HIGH (ref 0.00–1.49)
INR: 2.49 — ABNORMAL HIGH (ref 0.00–1.49)
INR: 2.8 — ABNORMAL HIGH (ref 0.00–1.49)
Prothrombin Time: 17.7 seconds — ABNORMAL HIGH (ref 11.6–15.2)
Prothrombin Time: 18.8 seconds — ABNORMAL HIGH (ref 11.6–15.2)
Prothrombin Time: 20.5 seconds — ABNORMAL HIGH (ref 11.6–15.2)
Prothrombin Time: 27 seconds — ABNORMAL HIGH (ref 11.6–15.2)
Prothrombin Time: 27.2 seconds — ABNORMAL HIGH (ref 11.6–15.2)
Prothrombin Time: 29.6 seconds — ABNORMAL HIGH (ref 11.6–15.2)

## 2010-04-20 LAB — COMPREHENSIVE METABOLIC PANEL
AST: 19 U/L (ref 0–37)
AST: 29 U/L (ref 0–37)
Albumin: 2 g/dL — ABNORMAL LOW (ref 3.5–5.2)
Alkaline Phosphatase: 84 U/L (ref 39–117)
CO2: 25 mEq/L (ref 19–32)
CO2: 25 mEq/L (ref 19–32)
Calcium: 9.6 mg/dL (ref 8.4–10.5)
Chloride: 110 mEq/L (ref 96–112)
Creatinine, Ser: 1.23 mg/dL (ref 0.4–1.5)
Creatinine, Ser: 1.38 mg/dL (ref 0.4–1.5)
GFR calc Af Amer: 59 mL/min — ABNORMAL LOW (ref >60)
GFR calc Af Amer: >60 mL/min (ref >60)
GFR calc non Af Amer: 49 mL/min — ABNORMAL LOW (ref >60)
GFR calc non Af Amer: 56 mL/min — ABNORMAL LOW (ref >60)
Glucose, Bld: 98 mg/dL (ref 70–99)
Potassium: 4.6 mEq/L (ref 3.5–5.1)
Total Bilirubin: 0.8 mg/dL (ref 0.3–1.2)
Total Protein: 6.3 g/dL (ref 6.0–8.3)

## 2010-04-20 LAB — DIFFERENTIAL
Lymphocytes Relative: 12 % (ref 12–46)
Lymphs Abs: 2.3 10*3/uL (ref 0.7–4.0)
Monocytes Relative: 8 % (ref 3–12)
Neutrophils Relative %: 80 % — ABNORMAL HIGH (ref 43–77)

## 2010-04-20 LAB — CBC
HCT: 39.8 % (ref 39.0–52.0)
Hemoglobin: 13.6 g/dL (ref 13.0–17.0)
Hemoglobin: 13.6 g/dL (ref 13.0–17.0)
Hemoglobin: 16.3 g/dL (ref 13.0–17.0)
MCH: 31.8 pg (ref 26.0–34.0)
MCH: 32.7 pg (ref 26.0–34.0)
MCH: 33 pg (ref 26.0–34.0)
MCHC: 34.2 g/dL (ref 30.0–36.0)
MCHC: 34.2 g/dL (ref 30.0–36.0)
MCV: 93.9 fL (ref 78.0–100.0)
MCV: 95.7 fL (ref 78.0–100.0)
MCV: 96.5 fL (ref 78.0–100.0)
Platelets: 146 10*3/uL — ABNORMAL LOW (ref 150–400)
Platelets: 158 10*3/uL (ref 150–400)
Platelets: 182 10*3/uL (ref 150–400)
RBC: 4.12 MIL/uL — ABNORMAL LOW (ref 4.22–5.81)
RBC: 4.28 MIL/uL (ref 4.22–5.81)
RBC: 4.74 MIL/uL (ref 4.22–5.81)
RDW: 13.8 % (ref 11.5–15.5)
RDW: 13.9 % (ref 11.5–15.5)
RDW: 13.9 % (ref 11.5–15.5)
WBC: 12.6 10*3/uL — ABNORMAL HIGH (ref 4.0–10.5)
WBC: 15 10*3/uL — ABNORMAL HIGH (ref 4.0–10.5)
WBC: 15.3 10*3/uL — ABNORMAL HIGH (ref 4.0–10.5)

## 2010-04-20 LAB — URINE CULTURE: Special Requests: NEGATIVE

## 2010-04-20 LAB — CULTURE, RESPIRATORY W GRAM STAIN: Culture: NORMAL

## 2010-04-20 LAB — BASIC METABOLIC PANEL
BUN: 29 mg/dL — ABNORMAL HIGH (ref 6–23)
Calcium: 8.7 mg/dL (ref 8.4–10.5)
Calcium: 9.8 mg/dL (ref 8.4–10.5)
Creatinine, Ser: 1.55 mg/dL — ABNORMAL HIGH (ref 0.4–1.5)
GFR calc Af Amer: 52 mL/min — ABNORMAL LOW (ref >60)
GFR calc non Af Amer: 43 mL/min — ABNORMAL LOW (ref >60)
GFR calc non Af Amer: 59 mL/min — ABNORMAL LOW (ref >60)
Glucose, Bld: 90 mg/dL (ref 70–99)
Sodium: 141 mEq/L (ref 135–145)

## 2010-04-20 LAB — HEPATIC FUNCTION PANEL
ALT: 21 U/L (ref 0–53)
AST: 20 U/L (ref 0–37)
Albumin: 2.1 g/dL — ABNORMAL LOW (ref 3.5–5.2)
Alkaline Phosphatase: 85 U/L (ref 39–117)
Bilirubin, Direct: 0.4 mg/dL — ABNORMAL HIGH (ref 0.0–0.3)
Indirect Bilirubin: 0.8 mg/dL (ref 0.3–0.9)
Total Bilirubin: 1.2 mg/dL (ref 0.3–1.2)
Total Protein: 4.5 g/dL — ABNORMAL LOW (ref 6.0–8.3)

## 2010-04-20 LAB — RPR: RPR Ser Ql: NONREACTIVE

## 2010-04-20 LAB — TSH: TSH: 1.96 u[IU]/mL (ref 0.350–4.500)

## 2010-04-20 LAB — VITAMIN B12: Vitamin B-12: 313 pg/mL (ref 211–911)

## 2010-04-20 LAB — BRAIN NATRIURETIC PEPTIDE: Pro B Natriuretic peptide (BNP): 113 pg/mL — ABNORMAL HIGH (ref 0.0–100.0)

## 2010-04-20 NOTE — Letter (Signed)
Summary: Statement of Medical Necessity [employer] / [facility]  Statement of Medical Necessity / [facility]   Imported By: Lennie Odor 04/12/2010 12:22:13  _____________________________________________________________________  External Attachment:    Type:   Image     Comment:   External Document

## 2010-04-27 ENCOUNTER — Telehealth: Payer: Self-pay | Admitting: Pulmonary Disease

## 2010-04-27 ENCOUNTER — Emergency Department (HOSPITAL_COMMUNITY)
Admission: EM | Admit: 2010-04-27 | Discharge: 2010-04-27 | Disposition: A | Discharge status: Home / Self Care | Payer: Medicare Other | Attending: Emergency Medicine | Admitting: Emergency Medicine

## 2010-04-27 ENCOUNTER — Emergency Department (HOSPITAL_COMMUNITY): Payer: Medicare Other

## 2010-04-27 DIAGNOSIS — E785 Hyperlipidemia, unspecified: Secondary | ICD-10-CM | POA: Insufficient documentation

## 2010-04-27 DIAGNOSIS — I10 Essential (primary) hypertension: Secondary | ICD-10-CM | POA: Insufficient documentation

## 2010-04-27 DIAGNOSIS — I714 Abdominal aortic aneurysm, without rupture, unspecified: Secondary | ICD-10-CM | POA: Insufficient documentation

## 2010-04-27 DIAGNOSIS — G510 Bell's palsy: Secondary | ICD-10-CM | POA: Insufficient documentation

## 2010-04-27 DIAGNOSIS — IMO0002 Reserved for concepts with insufficient information to code with codable children: Secondary | ICD-10-CM | POA: Insufficient documentation

## 2010-04-27 DIAGNOSIS — Z8673 Personal history of transient ischemic attack (TIA), and cerebral infarction without residual deficits: Secondary | ICD-10-CM | POA: Insufficient documentation

## 2010-04-27 LAB — CBC
Hemoglobin: 15.9 g/dL (ref 13.0–17.0)
Platelets: 156 10*3/uL (ref 150–400)
RBC: 5.08 MIL/uL (ref 4.22–5.81)
RDW: 13.4 % (ref 11.5–15.5)

## 2010-04-27 LAB — DIFFERENTIAL
Lymphocytes Relative: 33 % (ref 12–46)
Lymphs Abs: 3.8 10*3/uL (ref 0.7–4.0)
Monocytes Absolute: 1.2 10*3/uL — ABNORMAL HIGH (ref 0.1–1.0)
Monocytes Relative: 11 % (ref 3–12)
Neutro Abs: 6.3 10*3/uL (ref 1.7–7.7)
Neutrophils Relative %: 54 % (ref 43–77)

## 2010-04-27 LAB — BASIC METABOLIC PANEL
BUN: 18 mg/dL (ref 6–23)
GFR calc non Af Amer: >60 mL/min (ref >60)
Potassium: 3.9 mEq/L (ref 3.5–5.1)
Sodium: 136 mEq/L (ref 135–145)

## 2010-04-27 LAB — PROTIME-INR: Prothrombin Time: 20.1 seconds — ABNORMAL HIGH (ref 11.6–15.2)

## 2010-04-27 NOTE — Telephone Encounter (Signed)
Called and advised pt her needed to go to the ED ASAP. Pt verbalized understanding and states he is going to [facility] [employer], [place]

## 2010-04-27 NOTE — Medication Information (Signed)
Summary: rov/sp   Anticoagulant Therapy  Managed by: Windell Hummingbird, RN Referring MD: Charlton Haws MD Supervising MD: Riley Kill MD, Maisie Fus Indication 1: Deep Vein Thrombosis - Leg (ICD-451.1) Indication 2: Pulmonary Embolism and Infarction (ICD-415.1) Lab Used: [facility] Site: [address] INR POC 2.3 INR RANGE 2 - 3  Dietary changes: no    Health status changes: no    Bleeding/hemorrhagic complications: no    Recent/future hospitalizations: no    Any changes in medication regimen? no    Recent/future dental: no  Any missed doses?: no       Is patient compliant with meds? yes       Allergies: 1)  Amoxicillin  Anticoagulation Management History:      The patient is taking warfarin and comes in today for a routine follow up visit.  Positive risk factors for bleeding include an age of 75 years or older and history of CVA/TIA.  The bleeding index is 'intermediate risk'.  Positive CHADS2 values include History of CHF, History of HTN, Age > 75 years old, and Prior Stroke/CVA/TIA.  The start date was 12/22/1997.  Anticoagulation responsible provider: Riley Kill MD, Maisie Fus.  INR POC: 2.3.  Cuvette Lot#: [id number].  Exp: 04/2011.    Anticoagulation Management Assessment/Plan:      The patient's current anticoagulation dose is Coumadin 4 mg tabs: take as directed by the [facility] Clinic....  The target INR is 2.0-3.0.  The next INR is due 05/17/2010.  Anticoagulation instructions were given to patient.  Results were reviewed/authorized by Windell Hummingbird, RN.  He was notified by Windell Hummingbird, RN.         Prior Anticoagulation Instructions: INR 1.7  Take 1 1/2 tablets today then increase dose to 1 tablet every day except 1 1/2 tablets on Monday.  Recheck INR in 2-3 weeks.   Current Anticoagulation Instructions: INR 2.3 Continue taking 1 tablet (4 mg) every day, except take 1.5 tablets (6 mg) on Mondays. Recheck in 4 weeks.

## 2010-04-27 NOTE — Miscellaneous (Signed)
Summary: Orders/[facility]  Orders/[facility]   Imported By: Sherian Rein 04/20/2010 09:31:38  _____________________________________________________________________  External Attachment:    Type:   Image     Comment:   External Document

## 2010-04-27 NOTE — Telephone Encounter (Signed)
This pt needs to report to ED asap. ATC x 3 and line busy, [facility]

## 2010-04-28 ENCOUNTER — Telehealth: Payer: Self-pay | Admitting: Pulmonary Disease

## 2010-04-28 ENCOUNTER — Encounter: Payer: Self-pay | Admitting: Pulmonary Disease

## 2010-04-28 LAB — CBC
HCT: 43.7 % (ref 39.0–52.0)
Hemoglobin: 14.2 g/dL (ref 13.0–17.0)
Hemoglobin: 14.8 g/dL (ref 13.0–17.0)
Hemoglobin: 17.7 g/dL — ABNORMAL HIGH (ref 13.0–17.0)
MCHC: 33.8 g/dL (ref 30.0–36.0)
MCHC: 34.4 g/dL (ref 30.0–36.0)
MCV: 97.1 fL (ref 78.0–100.0)
MCV: 97.5 fL (ref 78.0–100.0)
Platelets: 123 10*3/uL — ABNORMAL LOW (ref 150–400)
Platelets: 131 10*3/uL — ABNORMAL LOW (ref 150–400)
Platelets: 148 10*3/uL — ABNORMAL LOW (ref 150–400)
Platelets: 152 10*3/uL (ref 150–400)
Platelets: 165 10*3/uL (ref 150–400)
RBC: 4.45 MIL/uL (ref 4.22–5.81)
RBC: 4.49 MIL/uL (ref 4.22–5.81)
RDW: 14 % (ref 11.5–15.5)
RDW: 14 % (ref 11.5–15.5)
RDW: 14 % (ref 11.5–15.5)
RDW: 14.2 % (ref 11.5–15.5)
RDW: 14.2 % (ref 11.5–15.5)
WBC: 10 10*3/uL (ref 4.0–10.5)
WBC: 9.2 10*3/uL (ref 4.0–10.5)

## 2010-04-28 LAB — PROTIME-INR
INR: 1.74 — ABNORMAL HIGH (ref 0.00–1.49)
INR: 2.04 — ABNORMAL HIGH (ref 0.00–1.49)
INR: 2.21 — ABNORMAL HIGH (ref 0.00–1.49)
INR: 2.25 — ABNORMAL HIGH (ref 0.00–1.49)
INR: 2.79 — ABNORMAL HIGH (ref 0.00–1.49)
Prothrombin Time: 17.9 seconds — ABNORMAL HIGH (ref 11.6–15.2)
Prothrombin Time: 18.7 seconds — ABNORMAL HIGH (ref 11.6–15.2)
Prothrombin Time: 20.2 seconds — ABNORMAL HIGH (ref 11.6–15.2)
Prothrombin Time: 22.9 seconds — ABNORMAL HIGH (ref 11.6–15.2)
Prothrombin Time: 24.3 seconds — ABNORMAL HIGH (ref 11.6–15.2)
Prothrombin Time: 24.7 seconds — ABNORMAL HIGH (ref 11.6–15.2)
Prothrombin Time: 29.2 seconds — ABNORMAL HIGH (ref 11.6–15.2)

## 2010-04-28 LAB — BASIC METABOLIC PANEL
BUN: 21 mg/dL (ref 6–23)
BUN: 22 mg/dL (ref 6–23)
BUN: 23 mg/dL (ref 6–23)
Calcium: 10.2 mg/dL (ref 8.4–10.5)
Calcium: 8.9 mg/dL (ref 8.4–10.5)
Calcium: 9.3 mg/dL (ref 8.4–10.5)
Chloride: 106 mEq/L (ref 96–112)
Creatinine, Ser: 0.97 mg/dL (ref 0.4–1.5)
Creatinine, Ser: 0.99 mg/dL (ref 0.4–1.5)
Creatinine, Ser: 1.11 mg/dL (ref 0.4–1.5)
Creatinine, Ser: 1.12 mg/dL (ref 0.4–1.5)
GFR calc Af Amer: >60 mL/min (ref >60)
GFR calc Af Amer: >60 mL/min (ref >60)
GFR calc Af Amer: >60 mL/min (ref >60)
GFR calc non Af Amer: >60 mL/min (ref >60)
GFR calc non Af Amer: >60 mL/min (ref >60)
GFR calc non Af Amer: >60 mL/min (ref >60)
GFR calc non Af Amer: >60 mL/min (ref >60)
Glucose, Bld: 100 mg/dL — ABNORMAL HIGH (ref 70–99)
Glucose, Bld: 118 mg/dL — ABNORMAL HIGH (ref 70–99)
Glucose, Bld: 94 mg/dL (ref 70–99)
Potassium: 4.2 mEq/L (ref 3.5–5.1)
Potassium: 4.2 mEq/L (ref 3.5–5.1)
Sodium: 132 mEq/L — ABNORMAL LOW (ref 135–145)
Sodium: 136 mEq/L (ref 135–145)
Sodium: 137 mEq/L (ref 135–145)

## 2010-04-28 LAB — HEPARIN LEVEL (UNFRACTIONATED)
Heparin Unfractionated: 0.24 IU/mL — ABNORMAL LOW (ref 0.30–0.70)
Heparin Unfractionated: 0.36 IU/mL (ref 0.30–0.70)
Heparin Unfractionated: 0.38 IU/mL (ref 0.30–0.70)
Heparin Unfractionated: 0.41 IU/mL (ref 0.30–0.70)
Heparin Unfractionated: 0.42 IU/mL (ref 0.30–0.70)
Heparin Unfractionated: 0.51 IU/mL (ref 0.30–0.70)
Heparin Unfractionated: 1.05 IU/mL — ABNORMAL HIGH (ref 0.30–0.70)
Heparin Unfractionated: <0.1 IU/mL — ABNORMAL LOW (ref 0.30–0.70)

## 2010-04-28 LAB — URINALYSIS, ROUTINE W REFLEX MICROSCOPIC
Glucose, UA: NEGATIVE mg/dL
Ketones, ur: 15 mg/dL — AB
Nitrite: NEGATIVE
Protein, ur: NEGATIVE mg/dL
Urobilinogen, UA: 1 mg/dL (ref 0.0–1.0)

## 2010-04-28 LAB — DIFFERENTIAL
Basophils Absolute: 0 10*3/uL (ref 0.0–0.1)
Lymphocytes Relative: 11 % — ABNORMAL LOW (ref 12–46)
Monocytes Absolute: 1.3 10*3/uL — ABNORMAL HIGH (ref 0.1–1.0)
Neutro Abs: 10.6 10*3/uL — ABNORMAL HIGH (ref 1.7–7.7)
Neutrophils Relative %: 79 % — ABNORMAL HIGH (ref 43–77)

## 2010-04-28 LAB — COMPREHENSIVE METABOLIC PANEL
ALT: 19 U/L (ref 0–53)
Alkaline Phosphatase: 130 U/L — ABNORMAL HIGH (ref 39–117)
BUN: 21 mg/dL (ref 6–23)
CO2: 25 mEq/L (ref 19–32)
Calcium: 9 mg/dL (ref 8.4–10.5)
GFR calc non Af Amer: >60 mL/min (ref >60)
Glucose, Bld: 112 mg/dL — ABNORMAL HIGH (ref 70–99)
Total Protein: 5.4 g/dL — ABNORMAL LOW (ref 6.0–8.3)

## 2010-04-28 LAB — CULTURE, BLOOD (ROUTINE X 2)

## 2010-04-28 MED ORDER — VALACYCLOVIR HCL 1 G PO TABS: 1000.0000 mg | ORAL_TABLET | Freq: Three times a day (TID) | ORAL | Status: AC

## 2010-04-28 MED ORDER — PREDNISONE (PAK) 10 MG PO TABS: ORAL_TABLET | ORAL | Status: DC

## 2010-04-28 NOTE — Telephone Encounter (Signed)
Pt was seen in the ER yesterday because he was having trouble closing his eye and had some weakness. He states he  was diagnosed with Bells' Palsy. Pt states they told him to f/u in 5 days with PCP. Please advise on appt. Carron Curie, MA

## 2010-04-28 NOTE — Telephone Encounter (Signed)
Pt returned call from triage.  °

## 2010-04-28 NOTE — Telephone Encounter (Signed)
Spoke with pt and notified of recs per SN.  Pt verbalized understanding and rxs were sent to [facility].  Appt for followup sched with TP sched for 05/13/10 at 3:45 pm.

## 2010-04-28 NOTE — Telephone Encounter (Signed)
Per Dr. Kriste Basque   Would like pt to take meds for bells palsy (ER did not recommend tx. )  1. Valtrex 1000mg  tid for 7 days #21 no refills 2. Prednisone taper 10mg  4 tabs for 2 days, then 3 tabs for 2 days, 2 tabs for 2 days, then 1 tab for 2 days, then stop, #20, no refills  follow up 2 weeks , can put on TP shcedule  LMOMTCB

## 2010-05-04 ENCOUNTER — Encounter: Payer: Self-pay | Admitting: Cardiovascular Disease

## 2010-05-05 ENCOUNTER — Telehealth: Payer: Self-pay | Admitting: Pulmonary Disease

## 2010-05-05 MED ORDER — ALBUTEROL SULFATE HFA 108 (90 BASE) MCG/ACT IN AERS: 1.0000 | INHALATION_SPRAY | Freq: Four times a day (QID) | RESPIRATORY_TRACT | Status: DC | PRN

## 2010-05-05 NOTE — Telephone Encounter (Signed)
Called spoke with patient, who states he has not had an albuterol inhaler "in a long time" to assist with his SOB w/ the allergy season esp at night.  No history of this med in EMR.  [facility].  Please advise if okay to give pt.  Allergies  Allergen Reactions  . Amoxicillin     REACTION: rash

## 2010-05-05 NOTE — Telephone Encounter (Signed)
Error dup note

## 2010-05-05 NOTE — Telephone Encounter (Signed)
Called spoke with patient, advised SN okayed refill on proair.  Pt verbalized his understanding.  rx sent to pt's verified pharmacy.

## 2010-05-05 NOTE — Telephone Encounter (Signed)
Per SN--ok for proair hfa  1-2 sprays every 6 hours as needed  And refill as needed. thanks

## 2010-05-13 ENCOUNTER — Ambulatory Visit (INDEPENDENT_AMBULATORY_CARE_PROVIDER_SITE_OTHER): Payer: Medicare Other | Admitting: Adult Health

## 2010-05-13 ENCOUNTER — Encounter: Payer: Self-pay | Admitting: Adult Health

## 2010-05-13 VITALS — BP 122/70 | HR 76 | Temp 97.4°F | Wt 165.4 lb

## 2010-05-13 DIAGNOSIS — G510 Bell's palsy: Secondary | ICD-10-CM

## 2010-05-13 NOTE — Patient Instructions (Signed)
Your Bells Palsy appears to be resolving with return of facial movement Cont on current regimen.  follow up Dr. Kriste Basque  As planned in 2 weeks.  Please contact office for sooner follow up if symptoms do not improve or worsen or seek emergency care

## 2010-05-13 NOTE — Assessment & Plan Note (Addendum)
Bells palsy dx 04/27/10  In ER. Neg CT head for acute changes. Right sided facial droop Tx with Valtrex and steroid burst.  Now improving-returning to baseline.  follow up in 2 weeks as planned. And As needed

## 2010-05-13 NOTE — Progress Notes (Signed)
  Subjective:    Patient ID: Glenn Barron, male    DOB: August 02, 1922, 77 y.o.   MRN: [id number]  HPI 77 y/o WM  With hx of recurrent bronchitis & allergies; hx PTE/ DVT/ & IVC filter on Coumadin; HBP; AFib; cerebrovasc dis w/ hx stroke & prev right CAE; ASPVD w/ prev AAA repair & subseq bilat Ao-iliac bypasses; Hyperchol; Prostate cancer; neuropathy; and anxiety/ depression...    05/13/2010 ER follow up- Pt was seen in ER on 3/20 with right sided facial drooping. CT head without acute changes. He was dx with Bells Palsy. Tx with conservative therapy. Pt called our office and was called in Valtrex and steroid taper. He has now finished. INitially with mod eye and mouth droop but has noticed considerable improvement. Now can close eye completely. Drinks liquids/eat without difficulty. No choking or cough. No visual/speech changes or extremity weakness. HE is feeling much better.    Review of Systems Constitutional:   No  weight loss, night sweats,  Fevers, chills, fatigue, or  lassitude.  HEENT:   No headaches,  Difficulty swallowing,  Tooth/dental problems, or  Sore throat,                No sneezing, itching, ear ache, nasal congestion, post nasal drip,   CV:  No chest pain,  Orthopnea, PND, swelling in lower extremities, anasarca, dizziness, palpitations, syncope.   GI  No heartburn, indigestion, abdominal pain, nausea, vomiting, diarrhea, change in bowel habits, loss of appetite, bloody stools.   Resp: .  No excess mucus, no productive cough,  No non-productive cough,  No coughing up of blood.  No change in color of mucus.  No wheezing.  No chest wall deformity  Skin: no rash   GU: no dysuria, change in color of urine .  No flank pain, no hematuria   MS:  No joint  swelling.    Psych:  No change in mood or affect.          Objective:   Physical Exam GEN: A/Ox3; pleasant , NAD, elderly frail male in wheelchair.   HEENT:  Thayer/AT,  EACs-clear, TMs-wnl, NOSE-clear, THROAT-clear, no  lesions, no postnasal drip or exudate noted. Mild eye droop on right- smile appear equal . Good facial strength. PERRL.   NECK:  Supple w/ fair ROM; no JVD; normal carotid impulses w/o bruits; no thyromegaly or nodules palpated; no lymphadenopathy.  RESP  Clear  P & A; w/o, wheezes/ rales/ or rhonchi.no accessory muscle use, no dullness to percussion  CARD:  RRR, no m/r/g  , no peripheral edema, pulses intact, no cyanosis or clubbing.  GI:   Soft & nt; nml bowel sounds; no organomegaly or masses detected.  Musco: Warm bil, no deformities or joint swelling noted.   Neuro: alert, nml grips, no arm drop.    Skin: Warm, no lesions or rashes         Assessment & Plan:

## 2010-05-17 ENCOUNTER — Other Ambulatory Visit: Payer: Self-pay | Admitting: Pulmonary Disease

## 2010-05-17 ENCOUNTER — Ambulatory Visit (INDEPENDENT_AMBULATORY_CARE_PROVIDER_SITE_OTHER): Payer: Medicare Other | Admitting: *Deleted

## 2010-05-17 DIAGNOSIS — I4891 Unspecified atrial fibrillation: Secondary | ICD-10-CM

## 2010-05-17 DIAGNOSIS — I2699 Other pulmonary embolism without acute cor pulmonale: Secondary | ICD-10-CM

## 2010-05-17 DIAGNOSIS — I82409 Acute embolism and thrombosis of unspecified deep veins of unspecified lower extremity: Secondary | ICD-10-CM

## 2010-05-17 DIAGNOSIS — Z7901 Long term (current) use of anticoagulants: Secondary | ICD-10-CM

## 2010-05-17 LAB — POCT INR: INR: 1.8

## 2010-05-27 ENCOUNTER — Other Ambulatory Visit: Payer: Self-pay | Admitting: Pulmonary Disease

## 2010-05-27 NOTE — Discharge Summary (Signed)
Glenn Barron, Glenn Barron NO.:  [id number]  MEDICAL RECORD NO.:  [id number]          PATIENT TYPE:  OBV  LOCATION:  1512                         FACILITY[facility]  PHYSICIAN:  Altha Harm, MDDATE OF BIRTH:  01/05/1923  DATE OF ADMISSION:  12/23/2009 DATE OF DISCHARGE:  12/24/2009                              DISCHARGE SUMMARY   DISCHARGE DISPOSITION:  Home.  DISCHARGE DIAGNOSES: 1. Intractable back pain, now improved. 2. Lumbar spondylosis. 3. Degenerative joint disease of the lumbar spine. 4. Hypertension.  SECONDARY DIAGNOSES: 1. Abdominal aortic aneurysm, status post repair. 2. Asthma quiescent. 3. Prostate cancer. 4. Atrial fibrillation. 5. Chronic anticoagulation. 6. Hyperlipidemia. 7. 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 8. History of DVT and PE in the past. 9. Status post IVC filter. 10.Peripheral vascular disease with right carotid endarterectomy. 11.Recent procedure on the right eye.  DISCHARGE MEDICATIONS: 1. Oxycodone 5-10 mg p.o. q.4 h. p.r.n. pain. 2. Prednisone 60 mg p.o. daily, for 5 days, then to be tapered by Dr.     Kriste Basque. 3. Advair Diskus 250/50 mcg 1 puff inhaled b.i.d. 4. Align probiotics 4 mg 1 tablet p.o. daily. 5. Aspirin enteric coated 81 mg p.o. daily. 6. Calcium carbonate 500 mg p.o. b.i.d. p.r.n. heartburn. 7. Coumadin 5 mg p.o. daily. 8. Flonase intranasally 1 spray twice daily. 9. Lasix 40 mg p.o. daily. 10.Klor-Con 20 mEq p.o. daily. 11.Lipitor 20 mg p.o. nightly. 12.Lisinopril 10 mg p.o. daily. 13.MiraLax 17 g in 8 ounces fluid p.o. b.i.d. 14.Pepcid 10 mg p.o. nightly. 15.Senokot 1 tablet p.o. daily.  PROCEDURES:  None.  DIAGNOSTIC STUDIES:  MRI of the lumbar spine which showed a prominent lumbar spondylosis and degenerative disk disease with multilevel subluxations, impingement most severe at L4-L5 and similar to that report of January 25, 2001.  There is common iliac artery aneurysm. Impression: 1.  T2 hyperintense L5-S1 intervertebral disk without endplate     irregularity, cannot totally exclude the possibility of early     diskitis also the lack of endplate changes makes this less likely. 2. Marrow head vicinity present.  All this can be caused by marrow     infiltrative process, the most common causes include anemia,     smoking, obesity, and advancing age.  PRIMARY CARE PHYSICIAN:  Lonzo Cloud. Kriste Basque, MD  CODE STATUS:  Full code.  ALLERGIES:  AMOXICILLIN which causes a nonlocalized rash.  CHIEF COMPLAINT:  Leg pain.  HISTORY OF PRESENT ILLNESS:  Please refer to the H and P by Dr. Butler Denmark for details of the HPI, however, in short this is an 77 year old gentleman with history of degenerative joint disease who has intermittent back pain approximately 1 week ago was having the same character of back pain when he was started on prednisone.  The patient completed the prednisone and about a week later, he started having back pain.  He states that the pain got steadily worse over 2 days.  He was unable to sleep properly and thus came to the emergency room for further evaluation and management.  He was referred to [facility] hospitalist for further evaluation and management.  HOSPITAL COURSE:  Back  pain.  I called Orthopedic Surgery to discuss his back pain with him.  The recommendation after they reviewed the MRI was that the patient be placed on prednisone and then discharged to follow up with them in the office if the pain persisted.  The patient was initially treated with IV Dilaudid and then transitioned over to oral medications.  The patient was evaluated by Physical Therapy who recommended home health.  The patient was ambulatory with a rolling walker.  He requested to be discharged home and stated that he would have someone coming to stay with him at night.  At this time, there were no further acute interventions and the patient states that his pain was well controlled with the  oral medications.  The patient is being discharged home where he says his daughter is coming and a CNA will stay tonight with him until his daughter arrives.  PHYSICAL EXAMINATION:  Condition at time of discharge, VITAL SIGNS:  His temperature is 97.5, heart rate 59, respiratory rate 16, blood pressure 118/74, O2 saturations are 98% on 2 L and 96% on room air. LUNGS:  Clear to auscultation.  No wheezing or rhonchi noted. CARDIOVASCULAR:  He has got normal S1 and S2.  No murmurs, rubs, or gallops noted. ABDOMEN:  Soft, nontender, nondistended.  No masses, no hepatosplenomegaly noted. EXTREMITIES:  No clubbing, cyanosis, or edema. GENERAL:  He is well appearing.  The patient is on chronic anticoagulation.  At time of discharge, his INR was 2.8.  The patient is to follow up with Dr. Kriste Basque in the office in 3-5 days for post hospital visit.  The patient will have his PT/INRs drawn in 48 hours and the results sent to Dr. Jodelle Green office.  The patient also has an appointment scheduled with Dr. Darrelyn Hillock, Orthopedics, on Monday, December 28, 2009, and Dr. Darrelyn Hillock said that he will see the patient on a walk-in basis as long as the patient calls after 8 o'clock.  DIETARY RESTRICTIONS:  The patient will be no added salt restrictions.  PHYSICAL RESTRICTIONS:  The patient ambulate with a walker and will be under the care of home health physical therapy at home.  Total time for discharge process approximately 35 minutes.     Altha Harm, MD     MAM/MEDQ  D:  05/19/2010  T:  05/20/2010  Job:  [id number]  Electronically Signed by Marthann Schiller MD on 05/27/2010 07:52:08 AM

## 2010-05-31 ENCOUNTER — Ambulatory Visit (INDEPENDENT_AMBULATORY_CARE_PROVIDER_SITE_OTHER): Payer: Medicare Other | Admitting: *Deleted

## 2010-05-31 DIAGNOSIS — I82409 Acute embolism and thrombosis of unspecified deep veins of unspecified lower extremity: Secondary | ICD-10-CM

## 2010-05-31 DIAGNOSIS — I4891 Unspecified atrial fibrillation: Secondary | ICD-10-CM

## 2010-05-31 DIAGNOSIS — I2699 Other pulmonary embolism without acute cor pulmonale: Secondary | ICD-10-CM

## 2010-06-01 ENCOUNTER — Ambulatory Visit (INDEPENDENT_AMBULATORY_CARE_PROVIDER_SITE_OTHER): Payer: Medicare Other | Admitting: Pulmonary Disease

## 2010-06-01 ENCOUNTER — Encounter: Payer: Self-pay | Admitting: Pulmonary Disease

## 2010-06-01 ENCOUNTER — Other Ambulatory Visit (INDEPENDENT_AMBULATORY_CARE_PROVIDER_SITE_OTHER): Payer: Medicare Other

## 2010-06-01 DIAGNOSIS — Z9889 Other specified postprocedural states: Secondary | ICD-10-CM

## 2010-06-01 DIAGNOSIS — I739 Peripheral vascular disease, unspecified: Secondary | ICD-10-CM

## 2010-06-01 DIAGNOSIS — I10 Essential (primary) hypertension: Secondary | ICD-10-CM

## 2010-06-01 DIAGNOSIS — I4891 Unspecified atrial fibrillation: Secondary | ICD-10-CM

## 2010-06-01 DIAGNOSIS — I872 Venous insufficiency (chronic) (peripheral): Secondary | ICD-10-CM

## 2010-06-01 DIAGNOSIS — G510 Bell's palsy: Secondary | ICD-10-CM

## 2010-06-01 DIAGNOSIS — I679 Cerebrovascular disease, unspecified: Secondary | ICD-10-CM

## 2010-06-01 DIAGNOSIS — R609 Edema, unspecified: Secondary | ICD-10-CM

## 2010-06-01 LAB — BASIC METABOLIC PANEL
BUN: 22 mg/dL (ref 6–23)
Chloride: 103 mEq/L (ref 96–112)
Glucose, Bld: 107 mg/dL — ABNORMAL HIGH (ref 70–99)
Potassium: 4.6 mEq/L (ref 3.5–5.1)
Sodium: 139 mEq/L (ref 135–145)

## 2010-06-01 NOTE — Patient Instructions (Signed)
Today we updated your med list in our EPIC system...    Continue your current meds the same...  Today we did your follow up fasting blood work...    Please call the [facility] in a few days for your results...    Dial [phone] & when prompted enter your patient number followed by the # symbol...    Your patient number is:  [id number]  Please please please be careful> NO FALLING!!! Call for any problems... Let's plan a follow up visit in 3 months.Marland KitchenMarland Kitchen

## 2010-06-01 NOTE — Progress Notes (Signed)
Subjective:    Patient ID: Glenn Barron, male    DOB: 03-22-1922, 77 y.o.   MRN: [id number]  HPI 77 y/o WM here for a follow up visit... he has mult med problems as noted below... Followed for general medical purposes w/ hx of recurrent bronchitis & allergies; hx PTE/ DVT/ & IVC filter on Coumadin; HBP; AFib; cerebrovasc dis w/ hx stroke & prev right CAE; ASPVD w/ prev AAA repair & subseq bilat Ao-iliac bypasses; Hyperchol; Prostate cancer; neuropathy; and anxiety/ depression...  ~  February 02, 2010:  DrNishan sent him to [facility]- DrEarly 8/11 w/ diffuse aneurysmal changes (s/p AAA repair 2000 & right CAE 2001, hx DVT & IVC filter on Coumadin);  of greatest concern is ~5cm left pop art aneurysm but he is felt to be too high risk for surg therefore rec observation alone & he will f/u w/ Walker Kehr for [employer]...  he was hosp by [facility] 11/11 for right arm cellulitis & ?pneumonia> treated w/ Vanco/ Avelox (cults neg, pos MRSA screen)... he went to [facility] for rehab now back home- lives alone w/ help 8H per day (son has Parkinson's, & pt won't move to [place] w/ daugh)... he has mult somatic complaints> eyes "brushed by DrGigengack at [facility]"; weak & BP low; constipated; legs swelling; wants cushion for wheelchair; etc...   ~  March 22, 2010:   6 wk ROV- still weak but improving slowly...    He has mult somatic complaints but overall appears stable;  he is in AFib clinically w/ controlled VR & HR=90, continues on Coumadin via CC;  he has persist LE edema w/ pitting & wt up 3#> on Lasix 40mg /d, states he's restricting sodium (but eating TV dinners), elevating legs, wearing support hose;  BP well controlled at 116/80 & we discussed 2gm sodium restriction & incr Lasix 40-80mg  daily...    Breathing is stable & he's been on Pred 10mg - 1/2 daily;  we discussed weaning to 5mg  Qod, continue Advair, etc...    He has a prob w/ his right hand> can't dorsiflex the 3rd-4th fingers & has appt w/ DrWeingold to  eval...    He notes some constipation & we have rec Miralax daily if necessary for regulation, along w/ his Senakot-S, Align, etc...  ~  June 01, 2010:  8mo ROV- generally stable & gaining strength slowly (he has good exerc program, still has help at home [facility] per day);  He had right Bell's Palsey 3/12 & went to ER then called [place] & we Rx w/ Valtrex & Pred Dosepak ==> resolved & back to normal now;  Breathing stable on the Pred 5mg  Qod, BP looks good & edema is sl better w/ his incr to 60mg /d> we discussed checking BMet (K=4.6, BUN=22, Creat=1.1) and incr Lasix to 80mg  Qam;  CV stable w/o CP or cerebral ischemic symptoms...  We reviewed his meds & decided to continue others the same for now...         Problem List:  OPHTHALMOLOGY - followed by [facility]...  ALLERGIC RHINITIS (ICD-477.9) - on OTC Antihist Prn and FLONASE Qhs...  BRONCHITIS, RECURRENT (ICD-491.9) - on ADVAIR 250Bid... ~  CXR 7/11 showed some hyperinflation, no change scarring & vol loss RLL, NAD.Marland Kitchen. ~  CXR 11/11 in hosp showed scarring right base w/ incr opac r/o superimposed pneum > he was placed on Pred during this adm & weaned slowly as outpt... ~  CXR 12/11 here showed baseline film w/ basilar scarring & NAD... on Pred 10mg /d &  asked to wean to 5mg /d... ~  4/12:  Chest remains clear, pt denies much cough, phlegm, ch in DOE, ch in edema, etc...  Hx of PULMONARY EMBOLISM (ICD-415.19) - he had a DVT w/ PTE in 2001 w/ IVC filter placed... he remains on COUMADIN w/ regular checks in the Coumadin Clinic- doing well.  HYPERTENSION (ICD-401.9) - on LASIX monotherapy (currently 60mg /d) w/ K20/d... ~  labs 10/10 showed BUN= 17, Creat= 1.0, K= 4.7 ~  11/10: c/o incr dizzy & weak- rec to decr the Lasix to 1/2 tab daily... ~  2/11:  hosp records show norm CBC, BMet, TSH, Urine. ~  7/11: c/o incr edema & wants to incr Lasix to 1-2 Qam; BUN=28, Creat=1.2, K=4.2, BNP=97 ~  Labs 11/11 in hosp on Lasix 40mg /d showed BUN/ Creat= 29/ 1.5  ==> 18/ 0.9 & BNP= 113. ~  4/12:  On Lasix40mg - 1.5 tabsQam & BP= 118/72; notes some fatigue, intermittent dyspnea, dizziness, & edema; but denies HA, visual changes, CP, palipit, syncope, etc; labs show K=4.6, BUN=22, Creat=1.1, rec incr Lasix to 80mg  Qam...  PAROXYSMAL ATRIAL FIBRILLATION (ICD-427.31) - prev on Sotalol80 but stopped 12/10 by DrNishan...  remains on Coumadin via clinic... he was prev maintaining NSR but has slipped back into AFib w/ controlled VR... ~  Last 2DEcho 2004 showed EF 50-55% w/ infer HK, incr AoV thickness, mild AI, mild LAdil, no thrombus seen... ~  EKG 12/10 showed SBrady w/ RBBB & 1st degree AVB... ~  6/11:  Last seen by DrNishan> he was still regular w/ pulse= 62... ~  Noted to be irreg 12/11 OV & clinical AFib w/ conrolled VR 2/12 OV  CEREBROVASCULAR DISEASE (ICD-437.9) - on ASA 81mg /d in addition to the Coumadin...  ~  he is s/p right carotid endarterectomy 7/01 by Windell Moulding...  ~  MRI 8/04 showed intracranial atherosclerotic changes w/ marked ectasia of V-B sys, sm right vertebral w/ prox stenosis, & >50% left ICA stenosis...  ~  CT Brain 1/09 showed atrophy and chr microvacs ischemic changes & remote IC lacune... ~  CDoppler 5/09 showed patent right CAE w/ DPA, mild plaque in bulb, 0-39% bilat ICA stenoses- no change... ~  CDoppler 5/10 showed patent right CAE w/ DPA, stable mild left carotid dis, 0-39% bilat- f/u 81yr.  PERIPHERAL VASCULAR DISEASE (ICD-443.9) - s/p AAA repair 8/00 & bilat Ao to iliac bypasses in 2001 by DrLawson... he had mult post-op complications and a long hosp course... vasc f/u by Nolon Stalls for [place] [facility]... 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   VENOUS INSUFFICIENCY, CHRONIC (ICD-459.81) - chr ven insuffic due to his mult DVT episodes & prev IVC filter  ?2001... he's had incr trouble w/ LE edema recently & requires incr Lasix dosing... ~  Venous Dopplers right leg 8/09 showed no evid for DVT, no superfic clots, etc... ~  8/09 developed cellulitis right leg which resolved to Keflex & local care... ~  [facility] 2/11 by [facility] for left hand cellulitis... ~  [facility] 11/11 by [facility] for right arm cellulitis... ~  2/12:  exam w/ persist incr LE edema w/ pitting> rec to incr LASIX 40==>80mg /d.  HYPERLIPIDEMIA (ICD-272.4) - on LIPITOR 20mg /d & prev FLP at goal on this dose... ~  FLP 5/08 showed TChol 124, Tg 99, HDL 40, LDL  65 ~  FLP 5/09 showed TChol 120, TG 93, HDL 36, LDL 66 ~  FLP 10/10 showed TChol 123, TG 96, HDL 47, LDL 57  GERD (ICD-530.81) - on PEPCID 20mg /d...  DIVERTICULOSIS OF COLON (ICD-562.10) IRRITABLE BOWEL SYNDROME (ICD-564.1) - he has diarrhea (uses Immodium) & constipation (uses Miralax/ Senakot-S) w/ a tough job regulating (uses ALIGN)... COLONIC POLYPS (ICD-211.3) - last colonoscopy 8/99 showed divertics, no recurrent polyps... last polyps removed 1996= adenomatous... also had incidental cecal lipoma seen.  Hx of PROSTATE CANCER (ICD-185) - s/p radical prostatectomy in 1994... he is followed by DrWrenn & has urinary incontinence for which he uses pads etc... ~  labs 10/10 showed PSA= 0.03 ~  labs 7/11 showed PSA= 0.04  DEGENERATIVE JOINT DISEASE (ICD-715.90) - he has a known lumbar disc disease and cervical spondylosis... he also takes Vit D 1000 u daily...  ~  labs 7/11 showed Vit D level = 30... continue OTC supplement.  Hx of STROKE (ICD-434.91) - he was hospitalized 8/04 w/ stroke and MRI showed acute left pontine infarct + diffuse intracranial atherosclerotic changes- see above- on ASA & Coumadin.  PERIPHERAL NEUROPATHY (ICD-356.9) - he has LBP, neuropathy and a gait abnormality...  ANXIETY DEPRESSION (ICD-300.4) - wife, Kathie Rhodes, died Jun 18, 2022 on hospice (severe pulm fibrosis from scleroderma/ CREST/ etc)...   Past Surgical History   Procedure Date  . Prostateectomy   . Cataract extraction   . Inguinal hernia repair   . Carotid endarterectomy     right  . Abdominal aortic aneurysm repair     Outpatient Encounter Prescriptions as of 06/01/2010  Medication Sig Dispense Refill  . albuterol (PROAIR HFA) 108 (90 BASE) MCG/ACT inhaler Inhale 1-2 puffs into the lungs every 6 (six) hours as needed.  1 Inhaler  11  . aspirin 81 MG tablet Take 81 mg by mouth daily.        Marland Kitchen atorvastatin (LIPITOR) 20 MG tablet Take 20 mg by mouth daily.        . calcium carbonate (TUMS - DOSED IN MG ELEMENTAL CALCIUM) 500 MG chewable tablet As needed       . cholecalciferol (VITAMIN D) 1000 UNITS tablet Take 1,000 Units by mouth daily.        . famotidine (PEPCID) 10 MG tablet Take 10 mg by mouth at bedtime.        . fluticasone (FLONASE) 50 MCG/ACT nasal spray 2 sprays by Nasal route daily.        . Fluticasone-Salmeterol (ADVAIR DISKUS) 250-50 MCG/DOSE AEPB Inhale 1 puff into the lungs 2 (two) times daily. Rinse mouth well       . furosemide (LASIX) 40 MG tablet Take 1-2 tabs by mouth once daily   REC to incr to 80mg  Qam...    . KLOR-CON M20 20 MEQ tablet take 1 tablet by mouth once daily  30 tablet  4  . lactulose (CHRONULAC) 10 GM/15ML solution Once daily       . oxyCODONE (OXY IR/ROXICODONE) 5 MG immediate release tablet Take 1 tab every 4-6 hours as needed pain as needed only per pt       . predniSONE (DELTASONE) 10 MG tablet take 1/2 tablet by mouth once daily  30 tablet  0  . PROBIOTIC CAPS Take 1 cap daily       . sennosides-docusate sodium (SENOKOT-S) 8.6-50 MG tablet Use as needed for constipation       . warfarin (COUMADIN) 4 MG tablet Take by mouth as directed. By the  Coumadin Clinic      . Polyethyl Glycol-Propyl Glycol (SYSTANE) 0.4-0.3 % SOLN Apply one drop in each eyetwo times a day         Allergies  Allergen Reactions  . Amoxicillin     REACTION: rash    Review of Systems         See HPI - all other systems neg  except as noted... The patient complains of decreased hearing, dyspnea on exertion, peripheral edema, muscle weakness, difficulty walking, and depression.  The patient denies anorexia, fever, weight loss, weight gain, vision loss, hoarseness, chest pain, syncope, prolonged cough, headaches, hemoptysis, abdominal pain, melena, hematochezia, severe indigestion/heartburn, hematuria, incontinence, suspicious skin lesions, transient blindness, unusual weight change, abnormal bleeding, enlarged lymph nodes, and angioedema.     Objective:   Physical Exam     WD, sl thin, 77 y/o WM in NAD... GENERAL:  Alert & oriented; pleasant & cooperative... HEENT:  /AT, EOM-wnl, PERRLA, EACs-clear, TMs-wnl, NOSE-clear, THROAT-clear & wnl. NECK:  Supple w/ fairROM; no JVD; s/p right CAE, no bruits; no thyromegaly or nodules palpated; no lymphadenopathy. CHEST: decr BS bilat- few rhonchi in RLL area, no consolidation, no wheezing... HEART:  sl irregular rhythm; w/o murmur, rubs, or gallop heard... ABDOMEN:  Soft & nontender; normal bowel sounds; no organomegaly or masses detected. EXT: without deformities, mod arthritic changes; ambulates w/ walker; +venous stasis, 2+edema noted. NEURO:  CN's intact;  gait abn; no focal neuro deficits,  +generalized weakness... DERM:  No lesions noted; no rash etc...   Assessment & Plan:   Bell's Palsey>  Right Bell's occurred in the interval, treated w/ Valtrex & Dosepak & resolved back to baseline...  HBP>  Remains under good control w/ his diet + Lasix monotherapy;  Due to his persist edema & labs today OK as above> rec incr Lasix to 80mg  Qam...  AFib>  Followed by Walker Kehr for cards, no Coumadin via clinic, controlled VR...  VASC DISEASE>  He has Cerebrovasc dis & Periph vasc dis followed by Walker Kehr & [facility]- DrEarly, DrLawson;  No ischemic symptoms, remains stable...  Venous Insuffic w/ Edema>  As above we will incr the Lasix to 80mg /d....  Other medical problems as  noted>  Gi, GU (prostate cancer), DJD, Neuropathy, etc..Marland Kitchen

## 2010-06-22 NOTE — Assessment & Plan Note (Signed)
[facility]                            CARDIOLOGY OFFICE NOTE   Glenn, Barron                     MRN:          [id number]  DATE:08/17/2006                            DOB:          [birth year]    Glenn Barron returns today for followup. He is a fairly complicated patient.   He has had multiple cardiac problems. He has had atrial fibrillation in  the past. He has been cardioverted. He has been maintaining sinus  rhythm. He is on chronic Coumadin and low dose sotalol. He has not had  any significant palpitations, bleeding diathysis or TIAs.   He is status post AAA repair with bilateral iliac grafts. This has been  stable. He has not had any surveillance ultrasounds and probably needs  this in followup. He has not had any significant abdominal pain. He has  also had a right CEA. His last duplex in May showed no residual stenosis  and he has been doing well with this. There have been no new CNS events  and no claudication.  His ambulatory ability is limited by neuropathy   He is also status post pulmonary emboli with an IVC filter. 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 He continues  to be on his Lasix with reasonable control of this. He has not had any  significant change in his chronic mild exertional dyspnea. He feels that  his legs are stable with no excess edema.   REVIEW OF SYSTEMS:  Is remarkable for paresthesias in his lower  extremities. He appears to have a neuropathy. This makes him unstable  with his gait. However, he is still a Coumadin candidate. He is very  careful and does pretty well with a cane. His review of systems is  otherwise negative.   MEDICATIONS:  1. Aspirin a day.  2. Sotalol 80 a day.  3. Lasix 20 a day.  4. K-Dur 20 a day.  5. Flonase.  6. Advair.  7. Lipitor 20 a day.  8. Pepcid.  9. Coumadin as directed.   PHYSICAL EXAMINATION:  Is remarkable for chronically ill appearing  octogenarian. Affect is appropriate. His blood pressure is 130/80, pulse  70 and regular. He is afebrile. His weight is 193. Respiratory rate is  14.  HEENT: Is normal.  NECK: Is remarkable for a right TEA scar. There is no residual bruit.  There is no thyromegaly. No lymphadenopathy. No JVP elevation.  LUNGS:  Are clear with good diaphragmatic motion. No wheezing.  There is an S1, S2 with a soft systolic murmur. PMI is normal.  ABDOMEN: Is benign. Is status post AAA surgery.  Femorals are +3 bilaterally. There is a faint right bruit. Distal pulses  are intact. There are mild varicosities and trace edema. He has  peripheral neuropathy halfway down the legs to his feet with some valgus  deformities. He has some muscular weakness in his quads and calves.  NEURO: Is otherwise nonfocal.   His EKG shows sinus rhythm with right bundle branch block.   IMPRESSION:  1. History of paroxysmal atrial fibrillation, maintaining sinus  rhythm. Continue Coumadin and low dose sotalol. PT intervals have      been fine and he is only taking his sotalol once a day.  2. History of pulmonary emboli. Currently stable. Continue Coumadin.      No increase in shortness of breath.  3. Post-phlebitic syndrome with lower extremity edema, stable.      Continue current dose of Lasix.  4. Previous CEA with peripheral vascular disease. Duplex showing no      residual stenosis. Followup duplex in two years.  5. Chronic obstructive pulmonary disease. Continue Flonase and Advair.      Currently not smoking. Followup with Dr.  Kriste Basque for this.  6. Soft systolic murmur, probably aortic valve sclerosis. No need for      echo at this time.  7. Peripheral neuropathy. Fairly significant. This causes a lot of      gait instability for the patient. He will followup with Dr.  Kriste Basque      for this. It may be worthwhile to do an MRI to make sure he does      not have spinal stenosis. Trial of Neurontin may also be       worthwhile. For the time being, he has both a walker and a quad      cane that he uses and he is fairly careful.   I will see Glenn Barron back in about 6 months.     Noralyn Pick. Eden Emms, MD, [facility]  Electronically Signed    PCN/MedQ  DD: 08/17/2006  DT: 08/17/2006  Job #: [id number]

## 2010-06-22 NOTE — Assessment & Plan Note (Signed)
[facility]                            CARDIOLOGY OFFICE NOTE   Glenn, Barron                     MRN:          [id number]  DATE:02/20/2007                            DOB:          Dec 10, 1922    Glenn Barron returns today for follow-up.  He has a history of PAF,  hypertension, and has had a previous CVA and right carotid  endarterectomy.  We have had him on low-dose sotalol. He has no  documented coronary disease.  He has some functional dyspnea due to  previous history of PE with a vena cava filter. He is on chronic  Coumadin.  His INR was therapeutic today. Has not had any bleeding  diathesis, although he is slowing down.  He gets around well with a  walker and has not been falling. He has not been having any significant  chest pain.   The patient was has not noted any palpitations.  He has a first-degree  block and right bundle branch block, but has not had any significant  syncope and has not may criteria for pacemaker therapy.  Looking back  through the chart, Glenn Barron has been running hypertensive. His systolics  usually in the 160 range.  He was 190 today when he came into the  office.  The only pill he is really on for blood pressure is Lasix,  which he takes for his lower extremity edema due to residual venous  disease and I told Glenn Barron that we would decrease his risk of stroke and  risk of recurrent A fib if we got his blood pressure under a little  better control.  Will start him on lisinopril 10 mg a day since he is on  Lasix and potassium already, I will check a BMET in 3 weeks.   REVIEW OF SYSTEMS:  Otherwise negative.   CURRENT MEDICATIONS:  1. Aspirin.  2. Sotalol 80 a day.  3. Lasix 20 a day.  4. K-Dur 20 a day.  5. Flonase.  6. Advair.  7. Lipitor 20 a day.  8. Pepcid 20 a day.  9. Coumadin as directed.   He uses the [facility] on [address].   PHYSICAL EXAMINATION:  His exam is remarkable for a  chronically ill-  appearing elderly white male in no distress.  Blood pressure is 168/80,  pulse is 60 and regular, afebrile, respiratory rate 14.  HEENT:  Unremarkable, status post right CVA without residual bruit, no  lymphadenopathy, thyromegaly, JVP elevation.  LUNGS:  Clear with good diaphragmatic motion.  No wheezing.  S1, S2 with normal heart sounds.  PMI normal.  ABDOMEN:  Benign.  Bowel sounds positive.  No AAA, no splenomegaly. No  hepatosplenomegaly. No hepatojugular  reflux and no tenderness, no AAA.  EXTREMITIES: His distal pulses are intact, no edema.  NEURO:  Nonfocal.  SKIN:  Warm and dry.  No muscular weakness.   IMPRESSION:  1. Paroxysmal atrial fibrillation. Currently maintaining sinus rhythm.      Continue sotalol 80 a day.  2. History of deep venous thrombosis with pulmonary embolism,  vena  cava filter in place.  Continue Coumadin as directed. At some      point, as Glenn Barron gets older, this may need to be stopped.  3. Hypertension.  Add lisinopril,  check BMET in 3 weeks.  Follow up      with Dr. Kriste Basque for general medical care.  4. Hyperlipidemia.  Continue Lipitor 20 a day, lipid and liver profile      in 6 months.  5. Lower extremity edema in the setting of previous deep venous      thrombosis. Low-salt diet.  Continue Lasix 20 a day and compression      hose.  6. Chronic obstructive pulmonary disease and exertional dyspnea.      Continue Advair 250/50.  Follow with Dr. Kriste Basque. He did get a flu      shot this year.   I will see or back in 6 months.     Noralyn Pick. Eden Emms, MD, [facility]  Electronically Signed    PCN/MedQ  DD: 02/20/2007  DT: 02/20/2007  Job #: [id number]

## 2010-06-22 NOTE — Assessment & Plan Note (Signed)
[facility]                                 ON-CALL NOTE   HUBERT, RAATZ                     MRN:          [id number]  DATE:12/28/2009                            DOB:          [birth year]    PRIMARY CARE Avamae Dehaan:  Lonzo Cloud. Kriste Basque, MD   REASON FOR CALL:  Temperature of 99.7.  Mr. Ackerley related that he had  been taking his temperature several times throughout the afternoon and  it had been above 99, but never greater than 100.  He feels listless.  He had reported the similar symptoms to Dr. Jodelle Green office earlier today  and talked with the nurse.  His oxycodone dose have been cut back which  he had yet to begin and he was told to continue his present prednisone  dose.  He was bothered by the fact that his temperature was higher than  it usually is when he was taking it and concerned that he may have an  infection.  Fortunately, he is seeing an orthopedic doctor tomorrow for  further evaluation of his foot pain.  I recommended that he continue to  take his temperature only if he felt that he had a fever and otherwise  comply with the advise given earlier today.  If he has any worsening of  the symptoms or any significant change, he should report to the  emergency room.     Rogelia Rohrer, MD  Electronically Signed    Clemetine Marker  DD: 12/28/2009  DT: 12/29/2009  Job #: [id number]

## 2010-06-22 NOTE — Consult Note (Signed)
NEW PATIENT CONSULTATION   Glenn Barron, SURIANO  DOB:  January 29, 1923                                       09/09/2009  EAVWU#:[id number]   The patient presents today referred from Dr. Charlton Haws for diffuse  aneurysmal changes.  The patient has a past history of abdominal aortic  aneurysm repair by Dr. Jerilee Field in 2000.  He also underwent subsequent  right carotid endarterectomy by Dr. Hart Rochester.  He recently underwent CT  scan of his abdomen and lower extremity CT angiogram as well.  I have  independently reviewed his arteriogram and discussed this with the  patient, his son and daughter-in-law present.  He does not have any  active symptoms referable to any of his peripheral vascular, aneurysm or  occlusive disease.  He does have history of prior stroke.  He has had  recent episodes of admission for pulmonary compromise and has had  prolonged recovery following this.  His wife passed late last year and  this has been quite difficult for him as well.  I have records provided  by [facility] healthcare office as well.  He is on chronic Coumadin therapy  and has had DVT in the past.  He also has had a vena cava filter placed  by Dr. Hart Rochester in the past.  He does have bilateral lower extremity  numbness and swelling.  He does not have any typical arterial rest pain.  He does have a history of hypertension.  He does not have any myocardial  infarction or congestive heart failure.   SOCIAL HISTORY:  He is widowed with three children.  He does not drink  or smoke.   FAMILY HISTORY:  Negative for premature atherosclerotic disease.  His  mother lived to age 60.   REVIEW OF SYSTEMS:  GENERAL:  He has had weight loss.  He is down to 170  pounds from greater than 220.  He is 6 feet tall.  VASCULAR:  Positive for stroke in 2007, prior venous thrombosis.  CARDIAC:  Positive for palpitations.  GI:  Positive for ulceration, diarrhea, constipation and difficulty  swallowing.  NEUROLOGIC:  Positive for dizziness.  PULMONARY:  Positive for asthma and bronchitis.  HEMATOLOGIC:  He is on Coumadin.  URINARY:  He has frequent urination and has had prior prostate cancer  surgery.  ENT:  He does have occasional nosebleeds.  MUSCULOSKELETAL:  Joint pain.  PSYCHIATRIC:  Does have some anxiety.  SKIN:  No positive review of systems.   PHYSICAL EXAM:  General:  A well-developed, alert gentleman appearing  stated age of 77 in no acute stress.  Vital signs:  Blood pressure is  115/76, pulse 106, respirations 18.  HEENT:  Normal.  Chest:  Clear  bilaterally without rales, rhonchi or wheezes.  Heart:  Regular rate and  rhythm without murmur.  His carotid arteries are without bruits  bilaterally.  He has a well-healed right carotid incision.  Abdomen:  Soft, nontender.  I do not palpate any aneurysm.  He does have a well-  healed midline incision.  Musculoskeletal:  Shows no major deformities.  He does have cyanosis of his lower extremities from his knees distally  with some edema.  Neurological:  No focal weakness.  Skin:  Without  ulcers or rashes.  Pulse status:  He has 2+ radial, 2+ femoral pulses  bilaterally.  On the left leg he has a prominent popliteal pulse and a  2+ dorsalis pedis pulse.  On the right he has absent popliteal pulse and  absent pedal pulses.   I reviewed his CT angiogram.  This reveals ectasia of his iliac arteries  bilaterally.  His prior aortofemoral graft is functioning quite well.  This is end-to-end in the common femoral artery on the right and there  is ectasia of the common femoral artery distal to the anastomosis.  On  the left the anastomosis is to the external iliac artery with retrograde  flow into the common iliac artery which had been ligated.  He does have  aneurysmal change in the common iliac artery on this side up to  approximately 2.5 cm.  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  On the right his aneurysm is approximately 3.5 to 4 cm at the popliteal  space.   I had an extremely long discussion with the patient and his family  present and have also discussed his case with Dr. Eden Emms by telephone.  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  He  is rather frail and has not recovered yet from a brief hospitalization  for pneumonia in February.  I feel that he is at some risk for  thromboses of his popliteal artery aneurysm and discussed this with the  patient and his family.  I feel that his risk for elective surgery far  outweighs the risk for popliteal artery changes due to his failing  health and Dr. Eden Emms also is of the opinion that he would be extremely  high risk and felt that observation would be best for the patient if he  would accept this.  He does understand that he does not need to limit  his exercise or walking program in any way and simply needs to have  blood pressure control.  I did discuss symptoms of thrombosed popliteal  aneurysms with the patient and his family who understand that this would  be an emergent situation and would be treated aggressively surgically.  He understands and will see [place] on an as-needed basis.     Larina Earthly, M.D.  Electronically Signed   TFE/MEDQ  D:  09/09/2009  T:  09/10/2009  Job:  [id number]   cc:   Noralyn Pick. Eden Emms, MD, [facility]  Scott M. Kriste Basque, MD  Quita Skye Hart Rochester, M.D.

## 2010-06-25 NOTE — Op Note (Signed)
NAME:  Glenn Barron, Glenn Barron                        ACCOUNT NO.:  [id number]   MEDICAL RECORD NO.:  [id number]                   PATIENT TYPE:  OIB   LOCATION:  2856                                 FACILITY:  [facility]   PHYSICIAN:  Guadelupe Sabin, M.D.             DATE OF BIRTH:  1922/10/24   DATE OF PROCEDURE:  DATE OF DISCHARGE:  06/14/2002                                 OPERATIVE REPORT   PREOPERATIVE DIAGNOSIS:  Senile nuclear cataract right eye.   POSTOPERATIVE DIAGNOSIS:  Senile nuclear cataract right eye.   OPERATION:  Planned extracapsular cataract extraction/phacoemulsification,  primary insertion of posterior chamber intraocular lens implant.   SURGEON:  Guadelupe Sabin, M.D.   ASSISTANT:  Nurse.   ANESTHESIA:  Local 4% Xylocaine, 0.75% Marcaine with [place] added  retrobulbar block, topical tetracaine, and intraocular Xylocaine.   DESCRIPTION OF PROCEDURE:  After the patient was prepped and draped, a lid  speculum was inserted in the right eye.  Schiotz tonometry was recorded at 7  scale units with a 5.5 gm weight indicating a normal intraocular pressure.  A peritomy was performed adjacent to the limbus from the 11 to 1 o'clock  position. The corneoscleral junction was cleaned and a corneoscleral groove  made with a 45 degree Superblade.  The anterior chamber was then entered  with a 2.5 mm diamond keratome at the 12 o'clock position and the 15 degree  blade at the 2:30 position.   Using a bent 26 gauge needle on a Healon syringe, a circular capsulorrhexis  was begun and then completed with the Grabow forceps.  Hydrodissection and  hydrodelineation were performed using 1% Xylocaine.  The 30 degree  phacoemulsification tip was then inserted with slow controlled  emulsification of the lens nucleus.  Total ultrasonic time one minute.  Average power level 14%.  Total amount of fluid used was 90 cc.   Following removal of the nucleus, the residual cortex was aspirated  with the  irrigation/aspiration tip.  The posterior capsule appeared intact with a  brilliant red fundus reflex.  It was therefore elected to insert an Allergan  medical optic SI40NB three piece silicon posterior chamber intraocular lens  implant, diopter strength +21.00.  This was inserted with McDonald forceps  into the anterior chamber and then centered into the capsular bag using the  [facility] lens rotator.  The lens appeared to be well centered.  The Healon,  which had been used throughout the procedure, was aspirated and replaced  with balance salt solution and Miochol ophthalmic solution.  The operative  incisions appeared to be self-sealing.  It was elected, however, to place a  single radial suture in this patient on Coumadin across the 12 o'clock  incision to insure closure if an intraocular hemorrhage developed.   Maxitrol ointment was instilled in the conjunctival cul-de-sac and a light  patch and protector shield applied.  Duration of the procedure and  anesthesia administration was 45 minutes.  The patient tolerated the  procedure well in general and left the operating room for the recovery room  in good condition.                                               Guadelupe Sabin, M.D.    HNJ/MEDQ  D:  06/14/2002  T:  06/16/2002  Job:  [id number]   cc:   Lonzo Cloud. Kriste Basque, M.D. [facility]

## 2010-06-25 NOTE — Consult Note (Signed)
NAME:  Glenn Barron, Glenn Barron                        ACCOUNT NO.:  [id number]   MEDICAL RECORD NO.:  [id number]                   PATIENT TYPE:  IPS   LOCATION:  4037                                 FACILITY:  [facility]   PHYSICIAN:  Learta Codding, M.D.                 DATE OF BIRTH:  January 19, 1923   DATE OF CONSULTATION:  09/10/2002  DATE OF DISCHARGE:                                   CONSULTATION   REASON FOR CONSULTATION:  Evaluation of bradycardia.   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  It was found that the patient had an acute left pontine infarct.  The  patient does have a history of paroxysmal atrial fibrillation, but has been  maintained on Sotalol as well as chronic Coumadin therapy.  A consult has  now been called as it was found that the patient had significant bradycardia  with heart rates in the 40's, albeit asymptomatic without symptoms of  dizziness, weakness, pre-syncope or syncope.  The patient has a history of  paroxysmal atrial fibrillation as well as pulmonary embolism, DVT and an  AAA.  The patient is status post IVC filter placement prior to a right  carotid endarterectomy on August 15, 1999.  He has been followed by Dr. Eden Emms  in the office for atrial fibrillation and maintained on Cardizem and  Sotalol.  Currently, the patient is hemodynamically stable.  He remains in  normal sinus rhythm.  He has no evidence of symptomatic bradycardia.   ALLERGIES:  1. PENICILLIN.  2. SULFA.   MEDICATIONS:  1. Coumadin.  2. Cardizem 120 mg a day.  3. KCL 20 mEq p.o. daily.  4. Lasix 40 mg a day.  5. Advair Diskus.  6. Sotalol 80 mg a day; Sotalol was placed on hold.  7. Enteric coated aspirin 81 mg a day.   PAST MEDICAL HISTORY:  1. History of left pontine CVA as outlined above.  2.  History of right carotid endarterectomy in July 2001.  3. Abdominal aortic aneurysm status post RNG in August 2000.  4. Postoperative PE and DVT and ARDS.  5. History of paroxysmal atrial fibrillation on chronic Coumadin therapy.  6. History of right bundle branch block.  7. History of mildly decreased left ventricular function with an ejection     fraction of 50 to 55%, mild aortic valve regurgitation, no MR.  8. History of hypertension.  9. History of gastroesophageal reflux disease.  10.      History of asthmatic bronchitis.  11.      History of chronic obstructive pulmonary disease.  12.      History of peripheral neuropathy.   SOCIAL HISTORY:   FAMILY HISTORY:  These have been reviewed on admission  history and physical.   REVIEW OF SYMPTOMS:  GENERAL:  No fevers or chills.  HEENT:  No sore throat.  CARDIAC:  No chest pain, shortness of breath and dyspnea on exertion.  No  definite claudication.  GU:  Positive for frequency and urgency.  NEUROLOGIC:  Right sided weakness.  MUSCULOSKELETAL:  No myalgias or  arthralgias.  GI:  No nausea or vomiting but occasional symptoms of  gastroesophageal reflux disease.   PHYSICAL EXAMINATION:  VITAL SIGNS:  Blood pressure 132/75, heart rate 69  beats per minute, respirations 18 per minute, weight is 188.5 pounds.  GENERAL:  Well nourished white male in no apparent distress.  He is alert  and oriented times three with slurred speech.  HEENT:  Normocephalic, atraumatic.  PERRLA.  The neck is supple.  No bruit.  No JVD.  HEART:  Regular rate and rhythm, bradycardic, normal S1 and S2.  LUNGS:  Clear bilaterally.  SKIN:  No rash or lesions.  ABDOMEN:  Soft and non-tender.  No rebound or guarding.  GU AND RECTAL EXAMS:  Deferred.  EXTREMITIES:  1+ peripheral edema.  NEUROLOGIC:  The patient is alert and oriented, grossly nonfocal.   LABORATORY DATA:  Chest x-ray is pending.   EKG:  Heart rate 50 beats per minute, sinus bradycardia, first  degree AV  block and right bundle branch block.   Hemoglobin 16.0, hematocrit 46.0, white count 9.9, platelet count 170,000.  Sodium 138, potassium 3.8, BUN 24, creatinine 1.2.  Cholesterol 120,  triglycerides 177, HDL 36, LDL 149.  Homocysteine 13.86.  INR 2.2.   IMPRESSION AND PLAN:  1. Status post left pontine infarct which is non-hemorrhagic.  Further     management as per primary service.  The patient will need to remain on     Coumadin therapy.  2. History of atrial fibrillation.  The patient remains in normal sinus     rhythm.  Continue Coumadin anticoagulation.  Would favor to hold Sotalol     and Cardizem for now, but if his heart rate picks up in the morning I     would resume Sotalol without the use of Cardizem.  If the patient becomes     hypertensive the addition of Norvasc which is a non-rate lowering calcium     channel blocker could be recommended.  3. Rule out congestive heart failure.  The patient does have peripheral     edema on exam.  I agree with a BNP level.  If significantly elevated     would recommend some mild diuresis.  4. History of peripheral vascular disease, but no known coronary artery     disease, asymptomatic.  Continue current medical therapy.    DISPOSITION:  1. We will hold Cardizem and Sotalol but resume hopefully Sotalol tomorrow     when heart rate has increased.  2. Follow-up BNP level.                                               Learta Codding, M.D.    GED/MEDQ  D:  09/10/2002  T:  09/10/2002  Job:  [id number]   cc:   Lonzo Cloud. Kriste Basque, M.D. [facility]   Charlton Haws, M.D.

## 2010-06-25 NOTE — Op Note (Signed)
NAME:  Glenn Barron, Glenn Barron                        ACCOUNT NO.:  [id number]   MEDICAL RECORD NO.:  [id number]                   PATIENT TYPE:  OIB   LOCATION:  2899                                 FACILITY:  [facility]   PHYSICIAN:  Guadelupe Sabin, M.D.             DATE OF BIRTH:  Jun 10, 1922   DATE OF PROCEDURE:  04/09/2002  DATE OF DISCHARGE:                                 OPERATIVE REPORT   PREOPERATIVE DIAGNOSIS:  Senile nuclear cataract left eye.   POSTOPERATIVE DIAGNOSIS:  Senile nuclear cataract left eye.   PROCEDURE:  Planned extracapsular cataract extraction - phacoemulsification,  primary insertion of posterior chamber intraocular lens implant.   SURGEON:  Guadelupe Sabin, M.D.   ASSISTANT:  Nurse.   ANESTHESIA:  Local 4% lidocaine, 0.75% Marcaine retrobulbar block, topical  Tetracaine, intraocular Xylocaine.  Anesthesia standby required. The patient  given sodium Pentothal intravenously during the period of retrobulbar  injection.   DESCRIPTION OF PROCEDURE:  After the procedure was prepped and draped, a lid  speculum was inserted in the left eye.  The eye was turned downward and a  superior rectus traction suture placed. Schiotz tonometry was recorded at 6  to 7 scale units with a 5.5 gram weight. A peritomy was performed adjacent  to the limbus from the 11 to 1 o'clock position. The corneoscleral junction  was cleaned and a corneoscleral groove made with a 45 degree Superblade. The  anterior chamber was then entered with a 2.5 mm diamond keratome at the 12  o'clock position and the 15 degree blade at the 2:30 position.  Using a bent  26 gauge needle on a Healon syringe, a circular capsulorrhexis was begun and  then completed with the Grabow forceps. Hydrodissection and hydrodelineation  were performed using 1% Xylocaine.  The 30 degree phacoemulsification tip  was then inserted with slow controlled emulsification of the lens nucleus.  Total ultrasonic time 59  seconds, average power level 16%, total amount of  fluid use 100 mL.  Following removal of the nucleus, the residual cortex was  aspirated with the irrigation aspiration tip. The posterior capsule appeared  intact with a brilliant red fundus reflex. It was therefore elected to  insert an Allergan Medial Optics SI40NB silicon three-piece posterior  chamber intraocular lens implant, Diopter strength +20.00. This was inserted  with the McDonald forceps into the anterior chamber and then centered into  the capsular bag using the [facility] lens rotator. The lens appeared to be well  centered. The Healon which had been used throughout the procedure was  aspirated and replaced with balanced salt solution and Miochol ophthalmic  solution. The operative incisions appeared to be self-sealing, however, in  this patient on Coumadin, it was elected to place one radial 10-0 nylon  suture across the larger incision at the 12 o'clock position to ensure  closure. Maxitrol ointment was instilled in  the conjunctival cul-de-sac and a light  patch and protector shield applied  to the operated left eye. Duration of the procedure and anesthesia  administration 45 minutes. The patient tolerated the procedure well in  general, and left the operating room for the recovery room in good  condition.                                               Guadelupe Sabin, M.D.    HNJ/MEDQ  D:  04/09/2002  T:  04/09/2002  Job:  [id number]   cc:   Lonzo Cloud. Kriste Basque, M.D. [facility]

## 2010-06-25 NOTE — H&P (Signed)
[facility]  Patient:    Glenn Barron, Glenn Barron                       MRN: [id number] Adm. Date:  08/25/99 Attending:  Quita Skye. Hart Rochester, M.D. Dictator:   Marlowe Kays, P.A. CC:         Lonzo Cloud. Kriste Basque, M.D. [facility]             Peter C. Eden Emms, M.D. [facility]             Stefani Dama, M.D.             Excell Seltzer. Annabell Howells, M.D.                         History and Physical  DATE OF BIRTH:  26-May-1922.  CHIEF COMPLAINT:  Right carotid artery stenosis.  HISTORY OF PRESENT ILLNESS:  A 77 year old white male with a complicated medical history, including known carotid artery stenosis of about 80%.  This carotid artery stenosis was found during a perioperative workup in September 2000.  The last Doppler velocities on the right internal carotid artery showed velocities of 292, end-diastolic 88, and distal 65-19, thus revealing moderate to severe disease.  Dr. Hart Rochester recommended right CEA to reduce the risk of stroke.  He denies any weakness, paresthesias, facial droops or difficulties standing.  He occasionally experiences staggering, although, it is not frequent.  No impaired speech.  No headache, no vertigo, no dizziness.  He denies any blurred vision, other than the chronic cataract formation, diagnosed by his ophthalmologist.  No amaurosis fugax.  No seizure activity.  No dysphagia, confusion or memory loss.  PAST MEDICAL HISTORY:  1. Carotid artery stenosis.  2. AAA, status post repair and graft.  3. History of postoperative PE (postoperative day #1).  4. History of postoperative DVT (postoperative day #3), as well as in  1999.  5. Status post ARDS with intubation, from postoperative day #4 to #6 in     September 2000.  6. History of pneumonia.  7. History of PAF, now in sinus rhythm.  8. History of prostate cancer and status post radiation and prostatectomy.  9. Hypertension. 10. Chronic Coumadin therapy. 11. History of asthmatic bronchitis. 12. History of peripheral  neuropathy with spinal stenosis. 13. Urinary incontinence. 14. History of scarlet fever as a child. 15. Ejection fraction 62%.  SURGERIES: 1. Status post repair and graft AAA, October 08, 1998, Dr. Hart Rochester. 2. Status post aorta to left external iliac artery and aorta to right common    iliac artery bypass by Dr. Hart Rochester, October 08, 1998. 3. Status post radical prostatectomy, Dr. Annabell Howells, 1993. 4. Status post tonsillectomy in 1930. 5. Status post right herniorrhaphy in 1991. 6. Status post vena cava ___ insertion via right common femoral artery    approach July 19, 1999.  MEDICATIONS:  1. Serevent inhaler two puffs b.i.d.  2. Flovent inhaler two puffs b.i.d.  3. Coumadin 5 to 7.5 mg p.o. q.d. (The patient is off Coumadin since     Thursday).  4. Diltiazem 120 mg p.o. q.d.  5. Flonase nasal spray two sprays in each nostril q.d.  6. Sotalol 80 mg one p.o. q.d.  7. Lasix 40 mg one p.o. q.d.  8. K-Dur 20 mEq p.o. q.d.  9. ___  10 mg  p.o. b.i.d. 10. Zyrtec 10 mg p.o. q.d. 11. Lovenox injection 80 mg.  The patient had administered on  August 23, 1999     two times, and on August 24, 1999 one time.  ALLERGIES:  PENICILLIN (it causes delayed rash four or five days later).  REVIEW OF SYSTEMS:  See HPI and past medical history of significant positive. The rest of the review of systems is essentially negative.  FAMILY HISTORY:  Mother died at 31 of heart disease.  Father died at 76 with history of pneumonia.  SOCIAL HISTORY:  Married, three children.  He is a retired Tree surgeon. He denies any tobacco or alcohol intake.  PHYSICAL EXAMINATION:  GENERAL:  A well-developed, well-nourished 77 year old white male in no acute distress.  Alert and oriented x 3.  Looks younger than his stated age, highly talkative.  VITAL SIGNS:  Blood pressure 140/80 on the left, 130/80 on the right.  Pulse 70, respirations 18.  HEENT:  Head normocephalic, atraumatic.  PERRLA, EOMI.  Bilateral  arcus senilis and right cataracts.  NECK:  Supple.  No JVD.  There is bilaterally faint bruit audible.  No thyromegaly or lymphadenopathy.  CHEST:  Symmetrical on inspiration.  No wheezes. There is a trace of rhonchi audible in the right lower lobe.  No lymphadenopathy.  CARDIOVASCULAR:  Regular rate and rhythm with no murmurs, rubs or gallops.  ABDOMEN:  Soft and nontender.  Bowel sounds x 4.  No hepatosplenomegaly or masses palpable.  There is a well-healed abdominal scar.  No abdominal bruits.  EXTREMITIES:  No clubbing.  There are some areas of ecchymosis secondary to Coumadin therapy.  No cyanosis.  There is 1+ bilateral edema in the lower extremities.  SKIN:  Reveals no ulcerations.  Warm temperature.  Normal hair pattern.  PERIPHERAL PULSES:  Carotids 2+ bilaterally with a faint bruit bilaterally. Femoral 2+ bilaterally with no bruits.  Popliteal, dorsalis pedis and posterior tibial 2+ bilaterally.  NEUROLOGICAL EXAM:  Grossly normal.  Normal gait.  Mild kyphosis.  DTRs 2+ bilaterally.  Muscle strength 5/5.  ASSESSMENT:  Pleasant 77 year old white male with known history of carotid artery stenosis who will undergo right carotid endarterectomy by Dr. Hart Rochester to reduce the risk of stroke.  Dr. Hart Rochester has seen and evaluated this patient prior to the admission and has explained the risk and benefits involving the procedure and the patient wishes to continue. DD:  08/24/99 TD:  08/24/99 Job: [id number]

## 2010-06-25 NOTE — H&P (Signed)
NAME:  Glenn Barron, Glenn Barron                        ACCOUNT NO.:  [id number]   MEDICAL RECORD NO.:  [id number]                   PATIENT TYPE:  INP   LOCATION:  3004                                 FACILITY:  [facility]   PHYSICIAN:  Casimiro Needle L. Thad Ranger, M.D.           DATE OF BIRTH:  15-Feb-1922   DATE OF ADMISSION:  09/07/2002  DATE OF DISCHARGE:                                HISTORY & PHYSICAL   CHIEF COMPLAINT:  Right-sided weakness.   HISTORY OF PRESENT ILLNESS:  This is a the initial [facility]  admission for this 77 year old male with a history of multiple medical  problems including lumbar spine disease.  The patient reports that for the  last several days he has felt weaker in his right leg.  His family noticed  that he has been dragging the right leg the past 5-6 days.  He initially  ascribed this to perhaps the problems with his back but, also for the past  day or two he has noticed that he is increasingly weak in the right upper  extremity to the point that he is having trouble manipulating a fork or  holding his cane in his right hand.   Today he had an episode of slurring of speech which now he thinks has  actually gotten a little bit better.  Slurred speech ultimately caused him  to come to the emergency room for further evaluation.  He denies any  previous history of similar symptoms.  He denies any associated headache,  chest pain, nausea, vomiting, shortness of breath.   PAST MEDICAL HISTORY:  1. Hypertension.  2. Coronary artery disease.  3. Congestive heart failure.  4. Aortic aneurism.  5. Right carotid endarterectomy.  6. Chronic anticoagulation.   FAMILY HISTORY:  Noncontributory.   SOCIAL HISTORY:  He does not smoke.  He lives with his wife and is  independent and is ambulatory with a cane.   ALLERGIES:  SULFA.   MEDICATIONS:  1. Coumadin 2.5 mg every Monday, Wednesday and Friday and 5 mg other days.  2. Diltiazem 120 mg daily.  3.  Potassium 20 mEq daily.  4. Sotalol 80 mg daily.  5. Lasix 40 mg daily.  6. Advair Diskus.  7. Flonase.   REVIEW OF SYSTEMS:  He has occasional swelling in his lower extremities.  He  is complaining of a little bit of pain behind his left lower leg.  Ten  system review, otherwise, negative except as outlined in the HPI and ER  records.   PHYSICAL EXAMINATION:  VITAL SIGNS:  Temperature 98.1, blood pressure  134/74, pulse 45, respirations 18.  GENERAL:  He is alert, in no distress.  He is minimally dysarthric.  MENTAL STATUS:  He is alert and fully oriented.  Attention, concentration,  fund of knowledge are appropriate.  He has no defect to confrontational  naming and can repeat a phrase.  HEAD:  Cranium is  normocephalic and atraumatic.  NECK:  Supple without carotid bruit.  HEART:  Bradycardic but regular with no murmurs.  CHEST:  Clear to auscultation.  ABDOMEN:  Soft, nontender.  Normoactive bowel sounds.  EXTREMITIES:  Edema 2+.  NEUROLOGICAL:  Mental status as above.  Cranial nerve examination:  Benign  fundi, equal and briskly reactive pupils.  Normal extraocular movements.  Minimal right facial weakness.  Normal movements of the tongue and palate.  Motor:  He has moderate weakness to the right lower extremity and mild  weakness of the right upper extremity with normal strength on the left.  Sensation is a bit diminished to light touch and pinprick on the right  compared to the left.  Reflexes are symmetric.  Finger-to-nose is performed  adequately.  Rapid movements are performed slowly on the right.  Babinski's  sign is present on the right.   LABORATORY DATA:  CBC is unremarkable.  CMET is remarkable for an elevated  glucose of 124.  Coagulations are normal.   CT of the head is personally reviewed.  It is interpreted as normal except  for mild atrophy.  I suspect the presence of a subacute lacuna in the left  external capsule.   IMPRESSION:  Subacute left brain stroke  with right hemiparesis.   PLAN:  Because of his chronic anticoagulation and his new right leg  weakness, he is at a high risk for falling, even though he is presently  therapeutic on his anticoagulation.   Admit.  I will add a baby aspirin to Coumadin for antiplatelet therapy.  I  will check him on MRA to make sure he does not have an arterial narrowing  which may need intervention.  Physical and occupational therapy  consultations and possible rehabilitation consultation.  Stroke Service will  follow.                                                Michael L. Thad Ranger, M.D.    MLR/MEDQ  D:  09/08/2002  T:  09/08/2002  Job:  [id number]

## 2010-06-25 NOTE — Discharge Summary (Signed)
Glenn Barron, Glenn Barron                          ACCOUNT NO.:  [id number]   MEDICAL RECORD NO.:  [id number]                    PATIENT TYPE:   LOCATION:                                       FACILITY:   PHYSICIAN:  Lonzo Cloud. Kriste Basque, M.D. [facility]            DATE OF BIRTH:   DATE OF ADMISSION:  DATE OF DISCHARGE:                                 DISCHARGE SUMMARY   FINAL DIAGNOSES:  1. Admitted March 14, 2002 with acute onset of nausea and vomiting and     diarrhea, likely related to gastroenteritis.  2. History of diverticulosis and colon polyps. Last colonoscopy in 1999 by     Dr. Jarold Motto.  3. Atherosclerotic peripheral vascular disease with previous aortic aneurysm     repair in August of 2000 with multiple postoperative complications     including deep vein thrombosis, pulmonary embolus, and ARDS requiring     ventilatory support. The patient also has right carotid stenosis and     previous right carotid endarterectomy. He also had an aortic to left     external iliac artery and aortic to right common iliac artery bypass     graft in August of 2001. The carotid surgery was in July of 2001.  4. History of asthmatic bronchitis and pneumonia in the past. He has an     abnormal chest x-ray with pleural parenchymal scarring at the right lung     base and chronic bronchitis symptoms with cough and sputum production,     which he is treated with Serevent, Flovent, and Humibid.  5. History of right bundle branch block and atrial fibrillation.  6. History of hypertension.  7. Degenerative arthritis with history of low back pain, spinal stenosis,     and peripheral neuropathy.  8. History of prostate cancer with previous radicle prostatectomy and     radiation therapy in 1993.  9. History of scarlet fever in childhood.   HISTORY OF PRESENT ILLNESS:  The patient is a 77 year old gentleman known to  me with multiple medical problems as listed above. He presented to the  emergency room on the  morning of admission with the sudden onset of nausea  and vomiting and diarrhea. Wife also noted that he was slightly confused. He  had awaken at 2:00 a.m. with several episodes of nausea and vomiting. He is  extremely weak and somewhat confused intermittently. Wife was unable to care  for him at home and called EMS and they transported him to the emergency  room for evaluation. In the emergency room, the emergency room doctor noted  him to be very weak and somewhat confused to his surroundings. He was  started on IV fluids. He denied any chest pain, abdominal pain, or blood in  the stools. There was no recent travel or antibiotic use. He had apparently  been feeling fine up until that early morning. There was no fever at  home  but it was 99.7 in the emergency room. He was referred for admission.   PAST MEDICAL HISTORY:  As noted, the patient had a history of diverticulosis  and colon polyps. His last colonoscopy was in August of 1999 by Dr.  Jarold Motto. He has a history of severe atherosclerotic peripheral vascular  disease with abdominal aortic aneurysm surgery in August of 2000 and  multiple postoperative complications including deep vein thrombosis and  pulmonary embolism and ARDS with prolonged respiratory difficulties. He has  history of right carotid stenosis and previous right carotid endarterectomy  in July of 2001. Then in August of 2001, he had peripheral vascular bypass  with an aorto to left iliac artery bypass and aorta to right common iliac  bypass. He was seen by the vascular surgeons. He has a history of chronic  obstructive pulmonary disease and asthmatic bronchitis with previous  pneumonia. He has a history of hypertension, paroxysmal atrial fibrillation  and a chronic right bundle branch block on his EKG. He has a history of  degenerative arthritis, spinal stenosis and a peripheral neuropathy. He has  had prostate cancer in 1993 with radical prostatectomy and  postoperative  radiation. He had scarlet fever as a child.   PHYSICAL EXAMINATION:  GENERAL: A 77 year old gentleman in mild acute  discomfort in the emergency room on stretcher.  VITAL SIGNS: Blood pressure 128/78, pulse 98 and regular, respiratory rate  28 per minute and slightly shallow. Temperature was 99.7. O2 sat 89% on room  air and 94% on 2 liters.  HEENT: Unremarkable although he was somewhat pale.  NECK: Supple. No adenopathy or jugular venous distention. Good carotid  upstrokes and healed surgical scar on the right.  CHEST: Diminished breath sounds at the bases with a few rhonchi.  CARDIAC: Regular rate and rhythm. Normal S1 and S2 with grade I/VI systolic  ejection murmur at the left sternal border.  ABDOMEN: Slightly distended but soft and nontender without evidence of  organomegaly. No guarding or rebound.  EXTREMITIES: Were warm. No clubbing, cyanosis, or edema.  NEURO: Intact without focal abnormalities detected.   DIAGNOSTIC IMPRESSION:  EKG showed sinus rhythm with right bundle branch  block and non-specific STT wave changes. No acute abnormalities. Chest x-ray  reveals moderate right lower lobe scarring and/or atelectasis which appears  to be chronic and no acute changes. Flat and upright abdomen showed  distended loops of small bowel with some air fluid levels noted. CT of the  brain showed no acute abnormalities. Bilateral carotid siphon and basilar  artery calcifications. Right anterior ethmoid sinusitis with minimal left  maxillary sinus disease as well. There was some moderate cortical atrophy  noted, consistent with his age.   LABORATORY DATA:  Hemoglobin and hematocrit 18.1 and 52.0. White count  10,100 with 88% segs. Repeat hemoglobin after hydration was 15.0 and  hematocrit of 44.5. White count 8,600 with 62% segs. Sed rate 5. ProTime  26.0. INR 2.9. PTT 35 seconds. Serial pro times were followed throughout his hospital course. Sodium 140, potassium 3.6,  chloride 105, CO2 29, BUN 35,  creatinine 1.4, blood sugar 129 and repeat 94. Calcium 91, total protein  6.0, albumin 3.2, AST 30, ALT 23, alkaline phosphatase 111, total bilirubin  1.2. Amylase 77, lipase 29, urinalysis clear. Blood cultures negative times  two.   HOSPITAL COURSE:  The patient was admitted with suspicion of an acute  gastroenteritis with nausea and vomiting and diarrhea and some dehydration  as evidenced by his hemoglobin  of 18. He was given IV fluid hydration with  improvement in hemoglobin down to 15, and there was no blood in the stool.  He did have temperature up to 102 on the day of admission. Blood cultures  were obtained and he was started on Rocephin. He had some green sputum but  cultures were not planted. He was given hand held nebulizer treatments with  Albuterol and improved with his oxygen and the Rocephin. He gradually  improved over the next several days. Abdomen remained distended and he had  difficulty with solid foods. He was kept on full liquids for several more  days then he was gradually advanced to a soft diet. Had a bowel movement and  was felt to have achieved maximal hospital benefit and was ready for  discharge on March 18, 2002.   DISCHARGE MEDICATIONS:  1. Tequin 400 mg by mouth each day until gone.  2. Guaifenesin 600 mg by mouth twice a day with plenty of fluids.  3. Pepcid 20 mg by mouth each day.  4. Reglan 5 mg by mouth three times a day with meals.  5. Lasix 40 mg by mouth each day.  6. K-Dur 20 mEq one tab by mouth each day.  7. Sotalol 80 mg by mouth each day.  8. Coumadin to resume as before.  9. Cardizem CD 120 mg by mouth each day.  10.      Serevent two puffs twice a day.  11.      Flovent 220 mg two puffs twice a day.  12.      Combivent two sprays every four hours as needed.  13.      Multivitamin daily.  14.      Flonase two sprays in each nostril daily.   FOLLOW UP:  The patient is instructed to call the office for  follow-up  appointment in one week.   CONDITION ON DISCHARGE:  Improved.                                                Lonzo Cloud. Kriste Basque, M.D. [facility]    SMN/MEDQ  D:  03/17/2002  T:  03/17/2002  Job:  [id number]

## 2010-06-25 NOTE — Discharge Summary (Signed)
NAME:  Glenn Barron, Glenn Barron                        ACCOUNT NO.:  [id number]   MEDICAL RECORD NO.:  [id number]                   PATIENT TYPE:  INP   LOCATION:  3004                                 FACILITY:  [facility]   PHYSICIAN:  P. Pearlean Brownie, M.D.                      DATE OF BIRTH:  16-Sep-1922   DATE OF ADMISSION:  09/07/2002  DATE OF DISCHARGE:  09/10/2002                                 DISCHARGE SUMMARY   DISCHARGE DIAGNOSES:  1. Left pontine infarction.  2. Hypertension.  3. Small vessel disease.  4. Congestive heart failure.  5. Status post right carotid endarterectomy.  6. Status post abdominal aortic aneurysm repair.  7. Status post radical prostatectomy for prostate cancer with resulting     urinary incontinence.  8. Lumbar spinal stenosis with lower extremity paresthesias.  9. Hyperlipidemia.  10.      History of pulmonary embolus on chronic Coumadin therapy.   DISCHARGE MEDICATIONS:  1. Aspirin 81 mg q.d.  2. Coumadin per pharmacy protocol.  3. Cardizem 120 mg q.d.  4. Potassium 20 mEq q.d.  5. Betapace 80 mg q.d.  6. Lasix 40 mg q.d.  7. Nasonex two sprays every day.  8. Advair one puff b.i.d.  9. Lipitor 20 mg q.d.   LABORATORY DATA AND X-RAY FINDINGS:  CT of head on admission shows atrophy  and small vessel disease, prominent tortuosity of the basilar artery with  indentation in cervical medullary junction.  No evidence of intracranial  hemorrhage.  There is partial opacification of right ethmoid sinus air  spaces.  MRI of the brain shows acute, nonhemorrhagic, left pontine  infarction, atrophy with mild to moderate small vessel disease, ectasia of  the vertebral basilar system.  MRA of the head shows intracranial  atherosclerotic type changes most notable along the anterior circulation  involving the left middle cerebral artery and a long branch of the posterior  circulation.  Left vertebral artery is dominant, small irregular appearing  right vertebral artery  with proximal stenosis, right vertebral artery in  pica.  There is a 60% proximal left internal carotid artery stenosis  possibly associated with an ulceration.  Question postoperative changes in  the right carotid bifurcation with slight narrowing 2 cm above the origin of  the right ICA without evidence of a hemodynamically significant stenosis.  A  2D echocardiogram showed an ejection fraction of 50-55% with inferior  hypokinesis.  There is a port acoustic window, mild AV regurgitation with no  obvious thrombus seen.   INR on September 08, 2002, is 2.2.  Hemoglobin 17, hematocrit 50, last white  blood cell 9.9 with platelets of 171.  Sodium 142, potassium 3.7, chloride  106, CO2 29, creatinine 1.2, glucose 94.  Total protein 5.5, albumin 3.0.  AST 24, ALT 19, Alk phos 119, total bilirubin 1.3, calcium 9.1.  Homocystine  normal at 13.86.  Lipid  profile with cholesterol 220, triglycerides 177, HDL  36 and LDL 149.   HISTORY OF PRESENT ILLNESS:  [name] is a 77 year old, right-  handed, white male who has a history of multiple medical problems.  The  patient reports that for the last several days he has felt weak in his right  leg.  The family noticed that he had been dragging his right leg for the  past week.  He initially described known problems with his back, but for the  past day or two, he has also noticed increasing weakness in the right upper  extremity with difficulty manipulating his fork or holding his cane in his  right hand.  Today, he had an episode of slurring of his speech which he now  thinks has gotten a little bit worse.  This prompted him to present to the  emergency room for evaluation.  He denies any history of any prior symptoms  similar to this.  He was admitted to the hospital for stroke evaluation.  He  was thought a TPA candidate secondary to presentation.   HOSPITAL COURSE:  Acute stroke work-up revealed an acute left pontine  infarction with resulting  right hemiparesis and dysarthria secondary to  small vessel disease.  Carotid Doppler was not done, however, MRA of the  neck did show a left ICA 50-60% stenosis that was ulcerated.  Surgical scar  at right ICA and some other diffuse atherosclerotic disease.  The patient is  on chronic Coumadin prior to admission for a pulmonary embolus x2.  Will  continue the Coumadin for his pulmonary history and will also help with  future possible embolic infarctions should they occur.  The patient was  placed on aspirin 81 mg secondary to congestive heart failure history and  other cardiac history.   PT/OT evaluated the patient and agreed that the short-term rehabilitation  for eventual return home was his best option.  Other risk factors identified  this hospitalization includes hyperlipidemia with elevated cholesterol and  elevated LDL.  The patient was started on Lipitor.   CONDITION ON DISCHARGE:  The patient is alert and oriented x3.  Speech is  dysarthric.  He has some mild right lower facial weakness.  He has a right  upper extremity drift with 4/5 strength proximally and 3/5 distally.  His  right lower extremity is 4/5 with unsteady gait.  He drags his right lower  extremity when walking with a walker.  His chest is clear to auscultation.  Heart rate is regular and distant, bradycardic.  He has 3+ pitting and edema  in his right lower extremity and 1+ pitting edema in his left lower  extremity.  He is not aphasic.   DISPOSITION:  Transfer to inpatient rehabilitation unit for continuation of  PT/OT and speech therapies.  Continue Coumadin and aspirin 81 mg q.d.   FOLLOW UP:  Follow up with Dr. Pearlean Brownie after discharge from rehabilitation.      Annie Main, Gerome Sam, M.D.    SB/MEDQ  D:  09/10/2002  T:  09/11/2002  Job:  [id number]   cc:   Lonzo Cloud. Kriste Basque, M.D. [facility]   Charlton Haws, M.D.   Pearlean Brownie, M.D.  [facility]

## 2010-06-25 NOTE — Assessment & Plan Note (Signed)
[facility]                            CARDIOLOGY OFFICE NOTE   Glenn Barron, SIMONIS                     MRN:          [id number]  DATE:02/09/2006                            DOB:          August 18, 1922    Glenn Barron returns today for followup.  I have followed him in the past,  primarily for conduction disease and PAF.  He has not had any atrial  fibrillation in a while.  We have decreased his sotalol to just 80 a  day.  He had some conduction abnormalities, but has not required a  pacemaker.  He has a chronic first degree block and right bundle branch  block.  He has not had any significant syncope.  He continues to  ambulate with his cane.  He has some chronic mild exertional dyspnea.  He has had previous PE and has an inferior vena cava filter placed.  Between his history of PE and atrial fibrillation, he has been on  chronic Coumadin with good INRs between 2 and 3.  He also takes a baby  aspirin a day.  He has no documented coronary disease.   REVIEW OF SYSTEMS:  Otherwise, remarkable for some prostate problems  from his previous cancer.  He has not had any significant with  hesitancy.  He has been seeing Dr. Kriste Basque on a regular basis.   MEDICATIONS:  Include an aspirin a day, sotalol 80 a day, Lasix 20 a  day, K-Dur 20 a day, Flonase p.r.n., Advair p.r.n., Lipitor 20 a day,  Pepcid, and Coumadin as directed.   EXAM:  He appears his stated age.  He is in sinus rhythm, rate of 56.  HEENT:  Normal.  Carotids have no bruits.  He is status post right CVA with a carotid  duplex in 2006 that showed no stenosis.  LUNGS:  Clear.  There is an S1, S2 with normal heart sounds.  ABDOMEN:  Benign.  LOWER EXTREMITIES:  Intact pulses and trace edema.   EKG:  Sinus rhythm with a first degree AV block and right bundle branch  block.   IMPRESSION:  Conduction disease with sick sinus syndrome, previous  paroxysmal atrial fibrillation, maintaining sinus rhythm on  low-dose  sotalol.  No evidence of chronotropic incompetence by recent treadmills.  Continue low-dose sotalol.  Avoid beta blockers if possible.  The  patient will continue on his Coumadin for his paroxysmal atrial  fibrillation and history of pulmonary embolism.  He will continue his  current dose of Lasix for his lower extremity edema, which is stable.  He will also continue his Lipitor for hyperlipidemia and followup with  Dr. Kriste Basque for this.  In regards to his liver tests, I will see him back  in 6 months.    Noralyn Pick. Eden Emms, MD, [facility]  Electronically Signed   PCN/MedQ  DD: 02/09/2006  DT: 02/09/2006  Job #: [id number]

## 2010-06-25 NOTE — H&P (Signed)
NAME:  Glenn Barron, Glenn Barron                        ACCOUNT NO.:  [id number]   MEDICAL RECORD NO.:  [id number]                   PATIENT TYPE:  INP   LOCATION:  0447                                 FACILITY:  [facility]   PHYSICIAN:  Lonzo Cloud. Kriste Basque, M.D. [facility]            DATE OF BIRTH:  August 12, 1922   DATE OF ADMISSION:  03/14/2002  DATE OF DISCHARGE:                                HISTORY & PHYSICAL   CHIEF COMPLAINT:  Nausea, vomiting, diarrhea, and confusion.   HISTORY OF PRESENT ILLNESS:  The patient is a 77 year old white male patient  of Dr. Jodelle Green who he follows for a known history of asthmatic bronchitis,  hypertension, arteriosclerotic peripheral vascular disease, and atrial  fibrillation presented to the emergency room this morning with a sudden  onset of nausea and vomiting, diarrhea and confusion.  The patient is  accompanied by his wife who states that he woke up approximately about 2-3  a.m. with several episodes of nausea and vomiting.  The patient was  extremely weak and somewhat confused intermittently.  She was unable to take  care of him at home and subsequently called EMS to transport to the  emergency room.  On arrival, the patient was very weak and confused to the  date and surroundings.  He was started on IV hydration with a slight  improvement in symptomology.  He denied any chest pain, abdominal pain,  shortness of breath, bloody stools or hematochezia, recent illness, travel,  or antibiotic use.  The patient states he had been feeling fine up until  awakening this morning.  There was no known fever; however, on arrival to  the emergency room there was a low-grade fever of 99.7.  The patient has  recently underwent a CT of the head which results are currently pending.  On  exam, the patient does appear somewhat pale and is confused to the date and  somewhat to his surroundings.  This is a major change in his normal  orientation.  The patient will require hospitalization  for further  evaluation, IV fluids, and hydration.   PAST MEDICAL HISTORY:  1. Severe arteriosclerotic peripheral vascular disease with known abdominal     aortic aneurysm status post repair in August of 2000 with multiple postop     complications including pulmonary embolism including DVT and adult     respiratory distress syndrome with prolonged ventilatory dependence.  The     patient also has a known history of right carotid stenosis status post     endarterectomy.  The patient also had a status post resection and     grafting of the abdominal aortic aneurysm, placement of an aorto-to-left-     external iliac-artery and aorto-to-right-common-iliac-artery bypass graft     in August of 2001.  He is status post inferior vena cava filter placement     on July 19, 1999 in anticipation of a right carotid endarterectomy in  July of 2001.  2. History of asthmatic bronchitis and pneumonia on Serevent, Flovent, and     Humibid.  3. History of paroxysmal atrial fibrillation and chronic right bundle branch     block now in a sinus rhythm on chronic Coumadin therapy.  He is followed     by Dr. Eden Emms in cardiology.  4. Hypertension.  5. Spinal stenosis and peripheral neuropathy.  6. History of prostate cancer status post radiation therapy and radical     prostatectomy in 1993.  7. History of scarlet fever in childhood.  8. History of colon polyps and diverticulosis.  Last colon was in August of     1999 followed by Dr. Jarold Motto.   PAST SURGICAL HISTORY:  Status post a __________ and tonsillectomy.   CURRENT MEDICATIONS:  1. K-Dur 20 mEq daily.  2. Furosemide 40 mg daily.  3. Sotalol 80 mg daily.  4. Coumadin daily.  5. Cardizem CD 120 mg daily.  6. Pepcid 20 mg daily.  7. Multivitamin daily.  8. Flonase two sprays daily.  9. Serevent two sprays b.i.d.  10.      Flovent 220 mcg two puffs b.i.d.  11.      Guaifenex LA 600 mg b.i.d.  12.      Combivent p.r.n.   DRUG ALLERGIES:   AMOXICILLIN.   SOCIAL HISTORY:  The patient is married.  Has three children.  Is a retired  Tree surgeon.  Is a never smoker.  Denied any alcohol use.   FAMILY HISTORY:  Positive for heart disease.   REVIEW OF SYSTEMS:  Essentially negative except as noted above.   PHYSICAL EXAMINATION:  VITAL SIGNS:  Temperature 99.7, blood pressure  129/78, pulse regular at 99, respiratory rate 28.  O2 saturation on room air  89%, 94% on 2 liters.  HEENT:  PERRL.  EOMI without nystagmus.  Facial features are symmetrical.  Posterior pharynx is clear.  Oral mucosa is pale and dry.  NECK:  Supple without cervical adenopathy.  No JVD noted.  Carotids with  positive upstrokes bilaterally.  A well-healed surgical scar on the right.  LUNGS:  Somewhat diminished breath sounds in the bases without any wheezing  or crackles.  CARDIAC:  Regular rate and rhythm.  S1 & S2 with a grade 1/6 systolic  murmur.  ABDOMEN:  Obese, soft without any hepatosplenomegaly.  Bowel sounds are  positive through all four quadrants.  There is no guarding or rebound noted.  EXTREMITIES:  Warm without any __________ cyanosis, clubbing, or edema.  Equal strength of the upper and lower extremities.  Normal hand grips.  NEUROLOGICAL:  Alert and oriented by person.  The patient is confused  slightly to the surroundings and date.  The patient does follow commands.   LABORATORY DATA:  Urinalysis is unremarkable.  CBC reveals a white blood  cell count at 10.1, hemoglobin and hematocrit at 18.1 and 52.0.  Chemistry  panel shows normal liver functions.  BUN and creatinine was 35 and 1.4  respectively.   IMPRESSION AND PLAN:  1. Probable acute gastroenteritis with dehydration.  The patient is being     admitted for IV hydration.  May have Phenergan as needed.  Ultrasound is     pending to rule out possible underlying gallbladder disease.  Labs    including amylase and lipase are pending.  2. Acute onset of confusion probably  secondary to #1.  CT of the head     results are currently pending.  Will  follow up accordingly.  3. History of atrial fibrillation.  Coumadin dose by pharmacy protocol with     daily PT and INRs.     Tammy Parrett, P.A. [facility]                   Scott M. Kriste Basque, M.D. [facility]   TP/MEDQ  D:  03/14/2002  T:  03/14/2002  Job:  [id number]

## 2010-06-25 NOTE — H&P (Signed)
NAME:  Glenn Barron, Glenn Barron                        ACCOUNT NO.:  [id number]   MEDICAL RECORD NO.:  [id number]                   PATIENT TYPE:  OIB   LOCATION:  2856                                 FACILITY:  [facility]   PHYSICIAN:  Guadelupe Sabin, M.D.             DATE OF BIRTH:  05/15/1922   DATE OF ADMISSION:  06/14/2002  DATE OF DISCHARGE:  06/14/2002                                HISTORY & PHYSICAL   REASON FOR ADMISSION:  This was a planned outpatient readmission of this 77-  year-old white male admitted for cataract implant surgery of the right eye.   HISTORY OF PRESENT ILLNESS:  This patient has been noted to have progressive  cataract formation in both eyes.  He was previously admitted on April 09, 2002 for successful cataract implant surgery of the left eye.  Postoperatively, the patient did well with return of vision 20/25 in the  operated left eye.  Meanwhile, in the unoperated right eye, the patient had  noted further deterioration to 20/50 -2.  This was associated with a  sensation of foggy vision when compared to his recently operated left eye.  He therefore elected to proceed with similar surgery of the right eye at  this time.   PAST MEDICAL HISTORY:  The patient has a long history of complex vascular  disease including pulmonary infarction and other vascular disease  necessitating the use of Coumadin.  While under the care of Dr. Lonzo Cloud.  Kriste Basque, it was felt that the patient should not discontinue his Coumadin.  The patient understood the possible risk of complications of eye surgery  while on Coumadin.  Since he did well on Coumadin with his first procedure,  he has elected to have a similar procedure at this time.   REVIEW OF SYMPTOMS:  No current cardiorespiratory complaints.  The patient  states that he bruised his right hand area on the morning of surgery and  there was a large ecchymoses present.   PHYSICAL EXAMINATION:  VITAL SIGNS:  As recorded on admission.   Blood  pressure 155/97, respirations 18, pulse 66, temperature 97.  GENERAL:  The patient is a pleasant, well-developed, well-nourished white  male in no acute distress.  HEENT:  The eyes are white and clear with nuclear cataract formation right  eye and pseudophakia left eye.  Applanation tonometry 19 mm right eye and 20  mm left eye.  Detailed fundus examination reveals a clear vitreous, attached  retina with normal optic nerve, blood vessels, and macula.  CHEST:  Lungs clear to auscultation and percussion.  HEART:  Normal sinus rhythm.  No cardiomegaly and no murmurs.  ABDOMEN:  Negative.  EXTREMITIES:  As noted above.    ADMISSION DIAGNOSES:  1. Senile nuclear cataract right eye.  2. Pseudophakia left eye.   PLAN:  Cataract implant surgery right eye while on Coumadin anticoagulants.  Guadelupe Sabin, M.D.    HNJ/MEDQ  D:  06/14/2002  T:  06/16/2002  Job:  [id number]   cc:   Lonzo Cloud. Kriste Basque, M.D. [facility]

## 2010-06-25 NOTE — H&P (Signed)
NAME:  Glenn Barron, Glenn Barron                        ACCOUNT NO.:  [id number]   MEDICAL RECORD NO.:  [id number]                   PATIENT TYPE:  OIB   LOCATION:  2899                                 FACILITY:  [facility]   PHYSICIAN:  Guadelupe Sabin, M.D.             DATE OF BIRTH:  1922/06/09   DATE OF ADMISSION:  04/09/2002  DATE OF DISCHARGE:                                HISTORY & PHYSICAL   CHIEF COMPLAINT:  This was a planned outpatient surgical admission of this  77 year old white male, admitted for cataract implant surgery of the left  eye.   HISTORY OF PRESENT ILLNESS:  This patient has been noted to have bilateral  cataract formation since he was first seen in my office in 2001.  He had  been told by Dr. Lyn Henri, his previous ophthalmologist, that he had  nuclear cataract formation.  An examination in my office revealed an acuity  of 20/30 right eye, 20/100 left eye with his present glasses.  With  refraction this was improved, and the patient was followed over the ensuing  years.  He gradually, however, has developed a wax paper-like skim over his  eyes, and he has requested cataract surgery.  The vision has deteriorated  with the best correction to 20/60 in the left eye, 20/40 in the right eye.  He was given oral discussion and printed information concerning the  procedure and its possible complications.  These included complications of  cataract surgery while on Coumadin.  He signed an informed consent and  arrangements were made for his outpatient admission.   PAST MEDICAL HISTORY:  The patient has a long history of vascular disease,  including pulmonary infarction and other vascular disease, necessitating the  use of his Coumadin.  While Dr. Lonzo Cloud. Kriste Basque said that he could come off  of the Coumadin transiently, the patient is very anxious, and elected to  proceed with surgery on Coumadin.  After discussing the appropriate risks,  he wishes to proceed.   REVIEW  OF SYSTEMS:  No current cardiorespiratory complaints.   PHYSICAL EXAMINATION:  GENERAL:  The patient is a pleasant well-nourished,  well-developed white male, in no acute distress.  VITAL SIGNS:  Blood pressure 111/82, respirations 18, heart rate 66,  temperature 97.1 degrees.  HEENT:  Eyes:  Visual acuity as noted above.  Slit lamp examination reveals  that the eyes are white and clear with bilateral nuclear cataract formation.  Applanation tonometry normal, 12 mm in each eye.  A detailed fundus  examination dilated with Mydriacyl reveals a clear vitreous, attached  retina, with normal optic nerve, blood vessels and macula.  CHEST:  Lungs clear to percussion and auscultation.  HEART:  A normal sinus rhythm.  No cardiomegaly, no murmurs.  ABDOMEN:  Negative.  EXTREMITIES:  Negative.   ADMISSION DIAGNOSIS:  Senile nuclear cataract, both eyes.   PLAN:  Cataract implant surgery, left eye  now while on Coumadin  anticoagulation.                                               Guadelupe Sabin, M.D.   HNJ/MEDQ  D:  04/09/2002  T:  04/09/2002  Job:  [id number]

## 2010-06-25 NOTE — Assessment & Plan Note (Signed)
Glenn Barron returns to the clinic today for followup evaluation.  He reports  that he has developed what he describes a callus of his right foot that has  been bothering him.  The main reason that he mentions this is that the  callus has prevented him from doing his regular exercise at [facility].  He had been doing organized physical therapy through that  unit, but now more recently has been going there for general exercise.  He  has been unable to use the bicycling equipment due to this callus that he  has developed on his right foot.  He did see a dermatologist by the name of  Dr. Yetta Barre and was apparently given salicylate acid which he has been using  to dissolve the callus.   The patient also has had a recent root canal and a crown down without  problems.  He also reports that Dr. Jarold Motto is planning a possible  followup colonoscopy, but the patient is concerned about the Coumadin and  the amount of fluid that he would have to take for contrast.  The patient  did undergo a followup modified barium swallow on January 20, 2003, and he  was advanced to a regular diet at that time.   The patient's Coumadin continues to be managed by the Coumadin Clinic at the  [facility].   The patient is otherwise doing well at this time and is independent with all  mobility.   MEDICATIONS:  1. Senokot daily.  2. Pepcid AC q.h.s.  3. Advair Diskus daily.  4. Flonase one spray to each nostril.  5. Aspirin 81 mg daily.  6. Lipitor 20 mg daily.  7. Lasix 40 mg daily.  8. Sotalol 80 mg daily.  9. Potassium chloride 20 mEq daily.  10.      Vitamins daily.   PHYSICAL EXAMINATION:  A well-appearing, thin, elderly male.  Blood pressure  162/79 with a pulse of 53 and O2 saturation 98% on room air.  The  respiratory rate was 16.  The patient has generally 5-/5 strength through  the bilateral upper extremities.  Bulk and tone were normal.  Reflexes were  2+ and symmetrical.   Left lower extremity strength was 5/5 throughout.  Right lower extremity strength showed hip flexion to 4+/5 and knee extension  5- to 5/5.  The patient ambulates without any assistive device at the  present time.   IMPRESSION:  1. 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  2. Dysphagia, improved.  3. Bradycardia.  4. History of pulmonary embolism.  5. Renal insufficiency.  6. History of congestive heart failure.  7. History of gastrointestinal bleed.  8. History of pulmonary embolus with inferior vena cava filter placed in     2001.   At the present, the patient is doing well and has been advanced to a regular  diet with a recent modified barium swallow.  I will plan on seeing the  patient in followup on an as needed basis.  He continues to attend  outpatient exercise classes at a local gym, but is actually not engaged in  any organized physical therapy at this time.  I have instructed him in other  aerobic exercises that he could attempt to do at the gym not using his feet  or lower extremities.  This would be upper extremity aerobic exercises using  an [employer] type machine.   I will plan on seeing the  patient on an as needed basis.      Glenn Barron, M.D.   DC/MedQ  D:  03/05/2003 17:01:59  T:  03/05/2003 18:10:51  Job #:  [id number]

## 2010-06-25 NOTE — Op Note (Signed)
[facility]  Patient:    ORVA, GWALTNEY                       MRN: [id number] Proc. Date: 08/25/99 Attending:  Quita Skye. Hart Rochester, M.D. CC:         Lorin Picket M. Kriste Basque, M.D. [facility]                           Operative Report  PREOPERATIVE DIAGNOSIS:       Right internal carotid stenosis dissection, asymptomatic.  POSTOPERATIVE DIAGNOSIS:      Right internal carotid stenosis dissection, asymptomatic.  OPERATION/PROCEDURE:          Right carotid endarterectomy with Dacron patch angioplasty. SURGEON:                      Dr. Quita Skye. Hart Rochester.  FIRST ASSISTANT:              Nurse.  ANESTHESIA:                   General endotracheal.  BRIEF HISTORY:  This patient was found to have a severe right internal carotid stenosis which is asymptomatic.  He has previously had pulmonary emboli on two occasions and has an inferior vena caval filter in place and is on chronic Coumadin therapy.  His Coumadin has been discontinued and he now comes for elective right carotid surgery for this asymptomatic lesion.  He has minimal disease on the left side.  DESCRIPTION OF PROCEDURE:  The patient was taken to the operating room and placed in the supine position at which time satisfactory general endotracheal anesthesia was administered. The right neck was prepped with Betadine scrub and solution, and draped in a routine sterile manner.  Incision was made along the anterior border of the sternocleidomastoid muscle and carried down to the subcu tissue and platysma using the Bovie.  The common facial vein and external jugular vein were ligated with 3-0 silk ties and divided exposing the common internal and external carotid arteries.  Care was taken not to injure the vagus or hypoglossal nerves both of which were exposed.  There was calcified atherosclerotic plaque at the carotid bifurcation extending up the internal carotid artery about 3 cm.  The distal vessel appeared normal.  A  #10 shunt was prepared and the patient was heparinized.  The carotid vessels were occluded with vascular clamps, longitudinal opening made in the common carotid with the 15 blade extending up the internal carotid with the Potts scissors to a point distal to the disease.  Plaque was about 80-90% stenotic in severity and mildly irregular.  A #10 shunt was inserted without difficulty reestablishing flow in about two minutes.  A standard endarterectomy was then performed using elevator and the Potts scissors with the inversion endarterectomy of the external carotid artery. The plaque feathered off the distal internal carotid artery nicely not requiring any tacking sutures.  The lumen was thoroughly irrigated with heparin saline, all loose debris was carefully removed and arteriotomy was closed with a patch using continuous 6-0 Prolene.  Prior to completion of the closure the shunt was removed.  After about 25 minutes of shunt time following antegrade and retrograde flushing the closure was completed reestablishing flow initially up the external and then up the internal branch.  Carotid was occluded for less than two minutes for removal of the shunt.  Protamine was then given  to reverse the heparin following adequate hemostasis. The wound was irrigated with saline and closed using Vicryl in a subcuticular fashion.  Sterile dressing was applied, and the patient taken to the recovery room in satisfactory condition. DD:  08/25/99 TD:  08/26/99 Job: [id number]

## 2010-06-25 NOTE — Discharge Summary (Signed)
[facility]  Patient:    Glenn Barron, Glenn Barron                     MRN: [id number] Adm. Date:  [id number] Disc. Date: [id number] Attending:  Colvin Caroli Dictator:   Loura Pardon, P.A. CC:         Lonzo Cloud. Kriste Basque, M.D. [facility]             Peter C. Eden Emms, M.D. [facility]             Stefani Dama, M.D.             Excell Seltzer. Annabell Howells, M.D.                           Discharge Summary  DATE OF BIRTH:  [birth year]  PRIMARY CARE-GIVER:  Lonzo Cloud. Kriste Basque, M.D.  CARDIOLOGIST:  Noralyn Pick. Eden Emms, M.D.  NEUROSURGEON:  Stefani Dama, M.D.  UROLOGIST:  Excell Seltzer. Annabell Howells, M.D.  FINAL DIAGNOSES:  1. Extracranial cerebrovascular occlusive disease, asymptomatic.  2. Severe right internal carotid artery stenosis.  SECONDARY DIAGNOSES:  1. Status post resection and grafting of abdominal aortic aneurysm in August     2000 with postoperative pulmonary embolism on postoperative day #1 and     postoperative deep venous thrombosis, postoperative day #3, with     concurrent postoperative adult respiratory distress syndrome prolonging     ventilatory dependence from postoperative days #4 to 6.  2. History of pneumonia.  3. History of paroxysmal atrial fibrillation, now in sinus rhythm.  4. History of prostate cancer, status post radiation therapy and radical     prostatectomy in 1993.  5. Hypertension.  6. Chronic Coumadin therapy.  7. Asthmatic bronchitis.  8. Peripheral neuropathy/spinal stenosis.  9. Urinary incontinence. 10. Scarlet fever in childhood. 11. Status post resection and grafting of abdominal aortic aneurysm, placement     of aorto-to-left-external-iliac-artery and     aorto-to-right-common-iliac-artery bypass grafts, October 08, 1999. 12. Status post tonsillectomy. 13. Status post herniorrhaphy. 14. Status post inferior vena cava filter placement, July 19, 1999, in     anticipation of right carotid endarterectomy to be performed     August 25, 1999.  PROCEDURE:   August 25, 1999, right carotid endarterectomy with Dacron patch angioplasty and intraoperative shunting, Dr. Quita Skye. Hart Rochester, Careers adviser.  The patient tolerated the procedure well and was transferred in stable and satisfactory condition, extubated, to the recovery room.  DISCHARGE DISPOSITION:  [name] is judged a suitable candidate for discharge on postoperative day #1.  He awoke in the recovery room with mental status clear and oriented.  He was moving both upper and lower extremities appropriately.  Cranial nerves II-XII were grossly intact.  His speech was clear.  He has been able to swallow well without difficulty.  His speech is clear and has continued to be clear.  He has full GI tract function.  He has been afebrile in the postoperative period.  His incision is healing nicely without evidence of erythema or drainage.  He is ambulating independently.  He has not had any nausea in the postoperative period.  His hemodynamics have remained stable.  He has not experienced any pulmonary compromise.  His postoperative laboratories are within normal limits.  DISCHARGE MEDICATIONS:  He will go home on the following medications, with particular regard for anticoagulation issues:   1. He will receive Coumadin 7.5 mg on  postoperative day #1 and then start his     regular therapy of 5 mg daily, Friday, Saturday and Sunday, July 20th,     21st and 22nd.  2. In addition, [name] will start once again his subcutaneous Lovenox of     80  mg injected twice day on Saturday, July 21st, and Sunday, July 22nd.  3. Serevent metered-dose inhaler two puffs b.i.d.  4. Flovent metered-dose inhaler two puffs b.i.d.  5. Cardizem CD (diltiazem) 120 mg daily.  6. Flonase nasal spray two sprays, each nostril daily.  7. Sotalol 80 mg daily.  8. Lasix 40 mg daily.  9. K-Dur 20 mEq daily. 10. Zyrtec 10 mg daily. 11. Humibid LA 600 mg twice daily. 12. Multivitamin daily. 13. Pepcid p.r.n.  FOLLOWUP:   [name] will report to [facility] on Monday, July 23rd, for anticoagulation therapy, PT/INR.  At that time, they will advise him as to how to amend his daily Coumadin dose if necessary.  As followup, he will see Dr. Hart Rochester in the office in two weeks; Dr. Rodell Perna office will call him with that appointment.  DISCHARGE DIET:  Low-sodium, low-cholesterol diet.  ACTIVITY:  He may ambulate as tolerated.  He is ask not to drive for two weeks.  WOUND CARE:  [name] may shower beginning Saturday, July 21st; he is to sponge bathe until then.  BRIEF HISTORY:  [name] is a 77 year old male whose medical history includes known extracranial cerebrovascular occlusive disease with right internal carotid artery stenosis of about 80%.  This was found during a preoperative workup in September of 2000; at that time, he was undergoing resection and grafting of abdominal aortic aneurysm.  Dr. Hart Rochester has recommended right carotid endarterectomy to reduce the risk of stroke.  He has a history of both pulmonary embolism and deep venous thrombosis and has been on chronic Coumadin therapy for this.  In preparation for this surgery, an inferior vena cava filter was placed by Dr. Hart Rochester in June.  Patient has had no complications from this.  His extracranial cerebrovascular disease is essentially asymptomatic.  He occasionally experiences staggering, although this is not frequent.  He has no impairment of speech, no headaches, no dizziness, no vertigo, no visual disturbances, although he does have chronic cataract formation.  He has no experienced any amaurosis fugax.  HOSPITAL COURSE:  Hospital course is as described in the discharge disposition.  [name] hospitalization lasted approximately one day.  He was judged a suitable candidate for discharge on postoperative day #1.  He remained afebrile.  His neurological status was intact without focal or  regional neurological  deficit.  He was ambulating.  His incision was healing nicely.  He was taking oral nourishment.  His mental status had remained clear.  His vital signs were stable.  He was judged a suitable candidate for discharge.  Medications as dictated.  Follow up with Dr. Hart Rochester in two weeks. DD:  08/25/99 TD:  08/27/99 Job: [id number]

## 2010-06-25 NOTE — Discharge Summary (Signed)
NAME:  Glenn Barron, Glenn Barron                        ACCOUNT NO.:  [id number]   MEDICAL RECORD NO.:  [id number]                   PATIENT TYPE:  IPS   LOCATION:  4037                                 FACILITY:  [facility]   PHYSICIAN:  Greg Cutter, P.A.              DATE OF BIRTH:  January 20, 1923   DATE OF ADMISSION:  09/10/2002  DATE OF DISCHARGE:  09/24/2002                                 DISCHARGE SUMMARY   DISCHARGE DIAGNOSES:  1. Acute nonhemorrhagic left pontine infarct.  2. Dysphagia.  3. Bradycardia, improved.  4. History of pulmonary embolism.  5. Renal insufficiency, resolved.  6. History of congestive heart failure.   HISTORY OF PRESENT ILLNESS:  Glenn Barron is a 77 year old male with a history  of coronary artery disease, AAA repair, admitted August 1 with complaints of  right-sided weakness for a few days and right hand weakness as well as  slurred speech of one to two days' duration.  Dr. Thad Ranger evaluated the  patient and MRI/MRA was done showing acute nonhemorrhagic left pontine  infarct with intracranial atherosclerosis, most notably along the anterior  circulation of left MCA and the long branches of posterior circulation, 50-  60% stenosis with ulceration approximately of proximal left ICA.  The  patient is on chronic Coumadin with INR therapeutic at 2.0 at admission.  Cardiac echocardiogram was done; report unavailable.  The patient has been  bradycardic with heart rate in 40s-50s range.  Low dose aspirin was added to  assist with CVA prophylaxis.  The patient continues with right hemiparesis.  Physical therapy: The patient is minimal assistance for transfers, minimal  assistance to hand held, assist to ambulate 60 feet, straight cane with  tendency to shuffle right foot and increasing ataxia with fatigue.  CIR was  consulted for progressive therapies.   PAST MEDICAL HISTORY:  1. PE with IVC filter placed in 2001.  2. History of peripheral edema.  3. GI bleed.  4.  DVTs.  5. CHF.  6. DJD bilateral shoulders and ankles.  7. Shortness of breath with exertion.  8. Bilateral inguinal hernia repair.  9. AAA repair.  10.      Right CA in 2001.  11.      Radical prostatectomy for cancer in 1993.  12.      Asthma.   ALLERGIES:  Sulfa and watermelon.   SOCIAL HISTORY:  The patient is married, lives in a two-level home with one  to two steps at entry.  He has a bedroom on the second level.  He was  ambulating with a cane prior to admission.  He does not use any tobacco or  alcohol.   HOSPITAL COURSE:  Glenn Barron was admitted to rehabilitation on  09/10/02 for inpatient therapies to consist of PT/OT daily.  Past admission,  the patient had been maintained on Coumadin and aspirin for CVA prophylaxis.  Secondary to bradycardia, cardiology was consulted  for input regarding  medication adjustment.  Charlton Haws, M.D., evaluated the patient and  adjusted medications.  Currently, Cardizem is being held and the patient  continues on sotalol 80 mg p.o. q.a.m.  The patient's heart rate at the time  of discharge ranging from occasional 50s to 70s range.  Blood pressures have  been stable.  Secondary to the patient's history of CHF, daily weights have  been monitored.  His Lasix was increased to 80 mg a day on 09/14/02.  A chest  x-ray was done as question of CHF.  This showed bibasilar atelectasis, right  greater than left, no edema.  Also, BMP checked was within normal limits at  89.8.   The patient's electrolytes have been followed along and BUN and creatinine  were initially stable at 23 and 1.0.  On 8/10, the patient reported some  problems with dysphagia.  An MBS was done on 8/11 showing the patient with  moderate oropharyngeal dysphagia, compatible with silent aspiration of thin  liquids secondary to premature delivery.  The patient was downgraded to  nectar thick liquids.  He was maintained on water protocol.  The patient's  renal status did worsen  slightly on nectar thick liquids with BUN rising to  32, creatinine at 1.2, and sodium at 146.  His Lasix was decreased to 40 mg  a day and the patient was encouraged to push nectar thick fluids.  The  patient was able to maintain his hydration on a lower dose Lasix with labs  on 8/16 showing sodium 139, potassium 3.7, chloride 106, CO2 28, BUN 21,  creatinine 1.1, glucose 96.  A repeat MBS was done on 8/16; however, this  showed no improvement with the patient continuing with severe pharyngeal  dysphagia.  The patient remains on a regular diet with nectar thick liquids.  He will continue to receive followup speech therapies with DPNS to help with  swallow.  Repeat MBS is recommended in four to five weeks.  The patient's  weight has been stable.  At the time of discharge, he is at 185.6 pounds.   The patient's ataxia is improving.  This does seem to worsen with fatigue.  Right upper extremity strength is slowly noted to be improving.  The patient  is able to complete all motor exercises and pharyngeal strengthening  exercises with minimal assistance.  He is able to manage his saliva with  minimal assistance to swallow excess oral secretions.  In terms of mobility,  the patient is currently close supervision for transfers, requiring verbal  cues occasionally.  He is able to navigate 200 feet x 3 with a rolling  walker.  He at supervision for self-care needs.  He does have an occasional  loss of balance when standing for dressing.  Endurance continues to be an  issue.  Currently, he is close supervision for ADL needs.  He does continue  with decrease in shoulder strength and shoulder range of motion in the right  upper extremity.  He has had problems with right ankle instability in the  past and a right AFO was ordered to help assist with this.  The patient will  continue to receive followup home health PT/OT as well as speech therapy by [facility] post discharge.  On 09/24/02, the  patient is discharged to  home.   DISCHARGE MEDICATIONS:  1. Lasix 40 mg p.o. per day.  2. Coumadin 5 mg one on Tuesday, Thursday, Saturday, Sunday, and half on  Monday, Wednesday, and Friday.  3. Advair 500/50 one puff b.i.d.  4. Lipitor 20 mg q.h.s.  5. Coated aspirin 81 mg per day.  6. K-Dur 20 mEq a day.  7. Sotalol 80 mg a day.  8. Pepcid one p.o. q.h.s.   ACTIVITY:  Supervision for now, walk only with supervision.  Check weights  daily and record.  Weight 8/16 at 185.6 pounds.   DIET:  Regular with nectar thick liquids.  Water protocol.   SPECIAL INSTRUCTIONS:  No alcohol, no smoking, no driving.  Advance to PT,  OT, speech therapy, and RN with INR, to draw pro time 8/20.  Results to be  called to the [facility].   FOLLOW UP:  1. The patient is to follow up with Lonzo Cloud. Kriste Basque, M.D. [facility], in two weeks     for recheck.  2. Follow up with Ellwood Dense, M.D., on 9/23 at 10:20 a.m.                                               Greg Cutter, P.A.    PP/MEDQ  D:  09/24/2002  T:  09/24/2002  Job:  [id number]   cc:   Lonzo Cloud. Kriste Basque, M.D. [facility]   Charlton Haws, M.D.   Michael L. Thad Ranger, M.D.  [address]  [address]  [place]  [place] [address]  Fax: [phone]

## 2010-06-28 ENCOUNTER — Ambulatory Visit (INDEPENDENT_AMBULATORY_CARE_PROVIDER_SITE_OTHER): Payer: Medicare Other | Admitting: *Deleted

## 2010-06-28 DIAGNOSIS — I4891 Unspecified atrial fibrillation: Secondary | ICD-10-CM

## 2010-06-28 DIAGNOSIS — I2699 Other pulmonary embolism without acute cor pulmonale: Secondary | ICD-10-CM

## 2010-06-28 DIAGNOSIS — I82409 Acute embolism and thrombosis of unspecified deep veins of unspecified lower extremity: Secondary | ICD-10-CM

## 2010-06-28 LAB — POCT INR: INR: 2.4

## 2010-07-26 ENCOUNTER — Ambulatory Visit (INDEPENDENT_AMBULATORY_CARE_PROVIDER_SITE_OTHER): Payer: Medicare Other | Admitting: *Deleted

## 2010-07-26 DIAGNOSIS — I4891 Unspecified atrial fibrillation: Secondary | ICD-10-CM

## 2010-07-26 DIAGNOSIS — I2699 Other pulmonary embolism without acute cor pulmonale: Secondary | ICD-10-CM

## 2010-07-26 DIAGNOSIS — I82409 Acute embolism and thrombosis of unspecified deep veins of unspecified lower extremity: Secondary | ICD-10-CM

## 2010-08-16 ENCOUNTER — Other Ambulatory Visit: Payer: Self-pay | Admitting: Pulmonary Disease

## 2010-08-18 ENCOUNTER — Other Ambulatory Visit: Payer: Self-pay | Admitting: Pulmonary Disease

## 2010-08-23 ENCOUNTER — Ambulatory Visit (INDEPENDENT_AMBULATORY_CARE_PROVIDER_SITE_OTHER): Payer: Medicare Other | Admitting: *Deleted

## 2010-08-23 DIAGNOSIS — I4891 Unspecified atrial fibrillation: Secondary | ICD-10-CM

## 2010-08-23 DIAGNOSIS — I2699 Other pulmonary embolism without acute cor pulmonale: Secondary | ICD-10-CM

## 2010-08-23 DIAGNOSIS — I82409 Acute embolism and thrombosis of unspecified deep veins of unspecified lower extremity: Secondary | ICD-10-CM

## 2010-08-23 LAB — POCT INR: INR: 2.4

## 2010-08-24 ENCOUNTER — Other Ambulatory Visit: Payer: Self-pay | Admitting: Pulmonary Disease

## 2010-09-01 ENCOUNTER — Encounter: Payer: Self-pay | Admitting: Pulmonary Disease

## 2010-09-01 ENCOUNTER — Ambulatory Visit (INDEPENDENT_AMBULATORY_CARE_PROVIDER_SITE_OTHER): Payer: Medicare Other | Admitting: Pulmonary Disease

## 2010-09-01 DIAGNOSIS — F419 Anxiety disorder, unspecified: Secondary | ICD-10-CM

## 2010-09-01 DIAGNOSIS — I739 Peripheral vascular disease, unspecified: Secondary | ICD-10-CM

## 2010-09-01 DIAGNOSIS — K589 Irritable bowel syndrome without diarrhea: Secondary | ICD-10-CM

## 2010-09-01 DIAGNOSIS — I2699 Other pulmonary embolism without acute cor pulmonale: Secondary | ICD-10-CM

## 2010-09-01 DIAGNOSIS — J42 Unspecified chronic bronchitis: Secondary | ICD-10-CM

## 2010-09-01 DIAGNOSIS — I872 Venous insufficiency (chronic) (peripheral): Secondary | ICD-10-CM

## 2010-09-01 DIAGNOSIS — F341 Dysthymic disorder: Secondary | ICD-10-CM

## 2010-09-01 DIAGNOSIS — Z23 Encounter for immunization: Secondary | ICD-10-CM

## 2010-09-01 DIAGNOSIS — I10 Essential (primary) hypertension: Secondary | ICD-10-CM

## 2010-09-01 DIAGNOSIS — R0600 Dyspnea, unspecified: Secondary | ICD-10-CM

## 2010-09-01 DIAGNOSIS — G609 Hereditary and idiopathic neuropathy, unspecified: Secondary | ICD-10-CM

## 2010-09-01 DIAGNOSIS — I509 Heart failure, unspecified: Secondary | ICD-10-CM

## 2010-09-01 DIAGNOSIS — K219 Gastro-esophageal reflux disease without esophagitis: Secondary | ICD-10-CM

## 2010-09-01 DIAGNOSIS — K5730 Diverticulosis of large intestine without perforation or abscess without bleeding: Secondary | ICD-10-CM

## 2010-09-01 DIAGNOSIS — E785 Hyperlipidemia, unspecified: Secondary | ICD-10-CM

## 2010-09-01 DIAGNOSIS — I4891 Unspecified atrial fibrillation: Secondary | ICD-10-CM

## 2010-09-01 DIAGNOSIS — I82409 Acute embolism and thrombosis of unspecified deep veins of unspecified lower extremity: Secondary | ICD-10-CM

## 2010-09-01 DIAGNOSIS — I6350 Cerebral infarction due to unspecified occlusion or stenosis of unspecified cerebral artery: Secondary | ICD-10-CM

## 2010-09-01 DIAGNOSIS — D126 Benign neoplasm of colon, unspecified: Secondary | ICD-10-CM

## 2010-09-01 DIAGNOSIS — C61 Malignant neoplasm of prostate: Secondary | ICD-10-CM

## 2010-09-01 MED ORDER — FLUTICASONE-SALMETEROL 250-50 MCG/DOSE IN AEPB: 1.0000 | INHALATION_SPRAY | Freq: Two times a day (BID) | RESPIRATORY_TRACT | Status: DC

## 2010-09-01 NOTE — Patient Instructions (Addendum)
Today we updated your med list in EPIC...    continue your current meds the same...  Please return to our lab one morning soon for your follow up fasting blood work...    Please call the [facility] in a few days for your results...    Dial [id number] & when prompted enter your patient number followed by the # symbol...    Your patient number is:  [id number]#  We gave you the combination Tetanus shot (w/ the whooping cough booster) called the TDAP today...    This should be good for 10 years (& I hope to give you another one at age 77!!!)  Call for any questions...  Let's plan a follow up visit in 4 months, sooner if needed for problems.Marland KitchenMarland Kitchen

## 2010-09-05 ENCOUNTER — Encounter: Payer: Self-pay | Admitting: Pulmonary Disease
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
## 2010-09-07 ENCOUNTER — Other Ambulatory Visit (INDEPENDENT_AMBULATORY_CARE_PROVIDER_SITE_OTHER): Payer: Medicare Other

## 2010-09-07 DIAGNOSIS — R0989 Other specified symptoms and signs involving the circulatory and respiratory systems: Secondary | ICD-10-CM

## 2010-09-07 DIAGNOSIS — I2699 Other pulmonary embolism without acute cor pulmonale: Secondary | ICD-10-CM

## 2010-09-07 DIAGNOSIS — E785 Hyperlipidemia, unspecified: Secondary | ICD-10-CM

## 2010-09-07 DIAGNOSIS — F341 Dysthymic disorder: Secondary | ICD-10-CM

## 2010-09-07 DIAGNOSIS — N139 Obstructive and reflux uropathy, unspecified: Secondary | ICD-10-CM

## 2010-09-07 DIAGNOSIS — R0600 Dyspnea, unspecified: Secondary | ICD-10-CM

## 2010-09-07 DIAGNOSIS — I10 Essential (primary) hypertension: Secondary | ICD-10-CM

## 2010-09-07 DIAGNOSIS — F411 Generalized anxiety disorder: Secondary | ICD-10-CM

## 2010-09-07 DIAGNOSIS — F419 Anxiety disorder, unspecified: Secondary | ICD-10-CM

## 2010-09-07 LAB — CBC WITH DIFFERENTIAL/PLATELET
Basophils Absolute: 0 10*3/uL (ref 0.0–0.1)
HCT: 47.3 % (ref 39.0–52.0)
Hemoglobin: 15.3 g/dL (ref 13.0–17.0)
Lymphs Abs: 3.5 10*3/uL (ref 0.7–4.0)
MCHC: 32.3 g/dL (ref 30.0–36.0)
MCV: 97 fl (ref 78.0–100.0)
Monocytes Absolute: 1.1 10*3/uL — ABNORMAL HIGH (ref 0.1–1.0)
Monocytes Relative: 11.2 % (ref 3.0–12.0)
Neutro Abs: 4.8 10*3/uL (ref 1.4–7.7)
RDW: 14.6 % (ref 11.5–14.6)

## 2010-09-07 LAB — BASIC METABOLIC PANEL
Calcium: 9.6 mg/dL (ref 8.4–10.5)
GFR: 72.75 mL/min (ref >60.00)
Potassium: 4.2 mEq/L (ref 3.5–5.1)
Sodium: 141 mEq/L (ref 135–145)

## 2010-09-07 LAB — HEPATIC FUNCTION PANEL
AST: 28 U/L (ref 0–37)
Albumin: 3.4 g/dL — ABNORMAL LOW (ref 3.5–5.2)
Alkaline Phosphatase: 108 U/L (ref 39–117)
Bilirubin, Direct: 0.2 mg/dL (ref 0.0–0.3)
Total Bilirubin: 0.8 mg/dL (ref 0.3–1.2)

## 2010-09-07 LAB — LIPID PANEL
Cholesterol: 105 mg/dL (ref 0–200)
LDL Cholesterol: 36 mg/dL (ref 0–99)
Triglycerides: 69 mg/dL (ref 0.0–149.0)

## 2010-09-20 ENCOUNTER — Ambulatory Visit (INDEPENDENT_AMBULATORY_CARE_PROVIDER_SITE_OTHER): Payer: Medicare Other | Admitting: *Deleted

## 2010-09-20 DIAGNOSIS — I82409 Acute embolism and thrombosis of unspecified deep veins of unspecified lower extremity: Secondary | ICD-10-CM

## 2010-09-20 DIAGNOSIS — I4891 Unspecified atrial fibrillation: Secondary | ICD-10-CM

## 2010-09-20 DIAGNOSIS — I2699 Other pulmonary embolism without acute cor pulmonale: Secondary | ICD-10-CM

## 2010-09-20 LAB — POCT INR: INR: 3.1

## 2010-10-18 ENCOUNTER — Ambulatory Visit (INDEPENDENT_AMBULATORY_CARE_PROVIDER_SITE_OTHER): Payer: Medicare Other | Admitting: *Deleted

## 2010-10-18 DIAGNOSIS — I2699 Other pulmonary embolism without acute cor pulmonale: Secondary | ICD-10-CM

## 2010-10-18 DIAGNOSIS — I4891 Unspecified atrial fibrillation: Secondary | ICD-10-CM

## 2010-10-18 DIAGNOSIS — I82409 Acute embolism and thrombosis of unspecified deep veins of unspecified lower extremity: Secondary | ICD-10-CM

## 2010-10-24 ENCOUNTER — Other Ambulatory Visit: Payer: Self-pay | Admitting: Pulmonary Disease

## 2010-11-14 ENCOUNTER — Other Ambulatory Visit: Payer: Self-pay | Admitting: Pulmonary Disease

## 2010-11-15 ENCOUNTER — Ambulatory Visit (INDEPENDENT_AMBULATORY_CARE_PROVIDER_SITE_OTHER): Payer: Medicare Other | Admitting: *Deleted

## 2010-11-15 DIAGNOSIS — I4891 Unspecified atrial fibrillation: Secondary | ICD-10-CM

## 2010-11-15 DIAGNOSIS — I82409 Acute embolism and thrombosis of unspecified deep veins of unspecified lower extremity: Secondary | ICD-10-CM

## 2010-11-15 DIAGNOSIS — I2699 Other pulmonary embolism without acute cor pulmonale: Secondary | ICD-10-CM

## 2010-12-13 ENCOUNTER — Ambulatory Visit (INDEPENDENT_AMBULATORY_CARE_PROVIDER_SITE_OTHER): Payer: Medicare Other | Admitting: *Deleted

## 2010-12-13 DIAGNOSIS — I4891 Unspecified atrial fibrillation: Secondary | ICD-10-CM

## 2010-12-13 DIAGNOSIS — Z7901 Long term (current) use of anticoagulants: Secondary | ICD-10-CM

## 2010-12-13 DIAGNOSIS — I82409 Acute embolism and thrombosis of unspecified deep veins of unspecified lower extremity: Secondary | ICD-10-CM

## 2010-12-13 DIAGNOSIS — I2699 Other pulmonary embolism without acute cor pulmonale: Secondary | ICD-10-CM

## 2010-12-14 ENCOUNTER — Telehealth: Payer: Self-pay | Admitting: Pulmonary Disease

## 2010-12-14 MED ORDER — AZITHROMYCIN 250 MG PO TABS: ORAL_TABLET | ORAL | Status: AC

## 2010-12-14 NOTE — Telephone Encounter (Signed)
ATC pt at home #.  Line busy x 3.  WCB

## 2010-12-14 NOTE — Telephone Encounter (Signed)
Spoke with patient having low grade fever, sore throat, increase chest congestion, and slight wheezing. Per TP okay to give Zpak #1 take as directed no refills and use Mucinex and Tylenol OTC with fluids and rest.   [name] aware of recs and rx sent to [facility].

## 2010-12-14 NOTE — Telephone Encounter (Signed)
Zpack # 1 take as directed  mucinex dm Twice daily  As needed   Tylenol As needed   Fluids and rest  Please contact office for sooner follow up if symptoms do not improve or worsen or seek emergency care  Ov if not improving

## 2010-12-20 ENCOUNTER — Telehealth: Payer: Self-pay | Admitting: Pulmonary Disease

## 2010-12-20 NOTE — Telephone Encounter (Signed)
Pt c/o productive cough with yellow mucus, appetite "comes and goes", weakness, wheezing when laying down and hard to sleep, temp increasing. Pt states his normal temp is 97.4, and is up to 98.6.Marland KitchenMarland Kitchen Is taking Mucinex 1 tab every 12 hours. I advised pt that it does take time for antibiotics to work but will forward to [facility] for review. Dr. Kriste Basque please advise, thanks! Allergies  Allergen Reactions  . Amoxicillin     REACTION: rash  [employer]

## 2010-12-20 NOTE — Telephone Encounter (Signed)
Per TP needs to take mucinex dm, get plenty of rest, fluids and call back if not improving. Pt verbalized understanding.

## 2011-01-04 ENCOUNTER — Encounter: Payer: Self-pay | Admitting: Pulmonary Disease

## 2011-01-04 ENCOUNTER — Ambulatory Visit (INDEPENDENT_AMBULATORY_CARE_PROVIDER_SITE_OTHER): Payer: Medicare Other | Admitting: Pulmonary Disease

## 2011-01-04 DIAGNOSIS — I2699 Other pulmonary embolism without acute cor pulmonale: Secondary | ICD-10-CM

## 2011-01-04 DIAGNOSIS — M1990 Unspecified osteoarthritis, unspecified site: Secondary | ICD-10-CM

## 2011-01-04 DIAGNOSIS — I739 Peripheral vascular disease, unspecified: Secondary | ICD-10-CM

## 2011-01-04 DIAGNOSIS — C61 Malignant neoplasm of prostate: Secondary | ICD-10-CM

## 2011-01-04 DIAGNOSIS — F341 Dysthymic disorder: Secondary | ICD-10-CM

## 2011-01-04 DIAGNOSIS — J42 Unspecified chronic bronchitis: Secondary | ICD-10-CM

## 2011-01-04 DIAGNOSIS — I679 Cerebrovascular disease, unspecified: Secondary | ICD-10-CM

## 2011-01-04 DIAGNOSIS — I10 Essential (primary) hypertension: Secondary | ICD-10-CM

## 2011-01-04 DIAGNOSIS — I6350 Cerebral infarction due to unspecified occlusion or stenosis of unspecified cerebral artery: Secondary | ICD-10-CM

## 2011-01-04 DIAGNOSIS — E785 Hyperlipidemia, unspecified: Secondary | ICD-10-CM

## 2011-01-04 DIAGNOSIS — I872 Venous insufficiency (chronic) (peripheral): Secondary | ICD-10-CM

## 2011-01-04 DIAGNOSIS — I4891 Unspecified atrial fibrillation: Secondary | ICD-10-CM

## 2011-01-04 NOTE — Patient Instructions (Signed)
Today we updated your med list in our EPIC system...    Continue your current medications the same...  Stay as active as possible...  Call for any problems...  Let's plan a follow up visit in about 4 months w/ FASTING blood work at that time.Marland KitchenMarland Kitchen

## 2011-01-04 NOTE — Progress Notes (Signed)
Subjective:    Patient ID: Glenn Barron, male    DOB: August 16, 1922, 77 y.o.   MRN: [id number]  HPI 77 y/o WM here for a follow up visit... he has mult med problems as noted below... Followed for general medical purposes w/ hx of recurrent bronchitis & allergies; hx PTE/ DVT/ & IVC filter on Coumadin; HBP; AFib; cerebrovasc dis w/ hx stroke & prev right CAE; ASPVD w/ prev AAA repair & subseq bilat Ao-iliac bypasses; Hyperchol; Prostate cancer; neuropathy; and anxiety/ depression...  ~  February 02, 2010:  DrNishan sent him to [facility]- DrEarly 8/11 w/ diffuse aneurysmal changes (s/p AAA repair 2000 & right CAE 2001, hx DVT & IVC filter on Coumadin);  of greatest concern is ~5cm left pop art aneurysm but he is felt to be too high risk for surg therefore rec observation alone & he will f/u w/ Walker Kehr for [employer]...  he was hosp by [facility] 11/11 for right arm cellulitis & ?pneumonia> treated w/ Vanco/ Avelox (cults neg, pos MRSA screen)... he went to [facility] for rehab now back home- lives alone w/ help 8H per day (son has Parkinson's, & pt won't move to [place] w/ daugh)... he has mult somatic complaints> eyes "brushed by DrGigengack at [facility]"; weak & BP low; constipated; legs swelling; wants cushion for wheelchair; etc...   ~  March 22, 2010:   6 wk ROV- still weak but improving slowly...    He has mult somatic complaints but overall appears stable;  he is in AFib clinically w/ controlled VR & HR=90, continues on Coumadin via CC;  he has persist LE edema w/ pitting & wt up 3#> on Lasix 40mg /d, states he's restricting sodium (but eating TV dinners), elevating legs, wearing support hose;  BP well controlled at 116/80 & we discussed 2gm sodium restriction & incr Lasix 40-80mg  daily...    Breathing is stable & he's been on Pred 10mg - 1/2 daily;  we discussed weaning to 5mg  Qod, continue Advair, etc...    He has a prob w/ his right hand> can't dorsiflex the 3rd-4th fingers & has appt w/ DrWeingold to  eval...    He notes some constipation & we have rec Miralax daily if necessary for regulation, along w/ his Senakot-S, Align, etc...  ~  June 01, 2010:  43mo ROV- generally stable & gaining strength slowly (he has good exerc program, still has help at home [facility] per day);  He had right Bell's Palsey 3/12 & went to ER then called [place] & we Rx w/ Valtrex & Pred Dosepak ==> resolved & back to normal now;  Breathing stable on the Pred 5mg  Qod, BP looks good & edema is sl better w/ his incr to 60mg /d> we discussed checking BMet (K=4.6, BUN=22, Creat=1.1) and incr Lasix to 80mg  Qam;  CV stable w/o CP or cerebral ischemic symptoms...  We reviewed his meds & decided to continue others the same for now...  ~  September 01, 2010:  43mo ROV & he remains stable, had SHINGLES vaccine 6/12 & we gave him the TDAP today; he follows a low sodium diet & will return for FASTING blood work next week... See prob list below>>  ~  January 04, 2011:  56mo ROV & he reports generally stable> cough & congestion improved, using MucinexDM; BP stable on Lasix+KCl; Cardiac stable, denies cerebral ischemic symptoms; labs from 7/12 reviewed; had skin cancer removed by Derm...         Problem List:  OPHTHALMOLOGY - followed by [facility] clinic... ~  7/12:  He reports that recent check up was OK...  ALLERGIC RHINITIS (ICD-477.9) - on OTC Antihist Prn and FLONASE Qhs...  BRONCHITIS, RECURRENT (ICD-491.9) - on ADVAIR 250Bid & PROVENTIL HFA Prn... ~  CXR 7/11 showed some hyperinflation, no change scarring & vol loss RLL, NAD.Marland Kitchen. ~  CXR 11/11 in hosp showed scarring right base w/ incr opac r/o superimposed pneum > he was placed on Pred during this adm & weaned slowly as outpt... ~  CXR 12/11 here showed baseline film w/ basilar scarring & NAD... on Pred 10mg /d & asked to wean to 5mg /d...  Hx of PULMONARY EMBOLISM (ICD-415.19) - he had a DVT w/ PTE in 2001 w/ IVC filter placed... he remains on COUMADIN w/ regular checks in the Coumadin Clinic-  doing well.  HYPERTENSION (ICD-401.9) - on LASIX monotherapy (currently 80mg /d) w/ K20/d... ~  labs 10/10 showed BUN= 17, Creat= 1.0, K= 4.7 ~  11/10: c/o incr dizzy & weak- rec to decr the Lasix to 1/2 tab daily... ~  2/11:  hosp records show norm CBC, BMet, TSH, Urine. ~  7/11: c/o incr edema & wants to incr Lasix to 1-2 Qam; BUN=28, Creat=1.2, K=4.2, BNP=97 ~  Labs 11/11 in hosp on Lasix 40mg /d showed BUN/ Creat= 29/ 1.5 ==> 18/ 0.9 & BNP= 113. ~  4/12:  On Lasix40mg - 1.5 tabsQam & BP= 118/72; notes some fatigue, intermittent dyspnea, dizziness, & edema; but denies HA, visual changes, CP, palipit, syncope, etc; labs show K=4.6, BUN=22, Creat=1.1, rec incr Lasix to 80mg  Qam... ~  7/12:  BP= 124/82 on Lasix 80mg /d... ~  11/12:  BP= 122/76 and he denies CP, palpit, dizzy, ch in SOB or edema...  PAROXYSMAL ATRIAL FIBRILLATION (ICD-427.31) - prev on Sotalol80 but stopped 12/10 by DrNishan...  remains on Coumadin via clinic... he was prev maintaining NSR but has slipped back into AFib w/ controlled VR... ~  Last 2DEcho 2004 showed EF 50-55% w/ infer HK, incr AoV thickness, mild AI, mild LAdil, no thrombus seen... ~  EKG 12/10 showed SBrady w/ RBBB & 1st degree AVB... ~  6/11:  Last seen by DrNishan> he was still regular w/ pulse= 62... ~  Noted to be irreg 12/11 OV & clinical AFib w/ controlled VR...  CEREBROVASCULAR DISEASE (ICD-437.9) - on ASA 81mg /d in addition to the Coumadin...  ~  he is s/p right carotid endarterectomy 7/01 by Windell Moulding...  ~  MRI 8/04 showed intracranial atherosclerotic changes w/ marked ectasia of V-B sys, sm right vertebral w/ prox stenosis, & >50% left ICA stenosis...  ~  CT Brain 1/09 showed atrophy and chr microvacs ischemic changes & remote IC lacune... ~  CDoppler 5/09 showed patent right CAE w/ DPA, mild plaque in bulb, 0-39% bilat ICA stenoses- no change... ~  CDoppler 5/10 showed patent right CAE w/ DPA, stable mild left carotid dis, 0-39% bilat- f/u  37yr.  PERIPHERAL VASCULAR DISEASE (ICD-443.9) - s/p AAA repair 8/00 & bilat Ao to iliac bypasses in 2001 by DrLawson... he had mult post-op complications and a long hosp course... vasc f/u by [facility] for [facility]... 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   VENOUS INSUFFICIENCY, CHRONIC (ICD-459.81) - chr ven insuffic due to his mult DVT episodes & prev IVC filter ?2001... he's had  incr trouble w/ LE edema recently & requires incr Lasix dosing... ~  Venous Dopplers right leg 8/09 showed no evid for DVT, no superfic clots, etc... ~  8/09 developed cellulitis right leg which resolved to Keflex & local care... ~  [facility] 2/11 by [facility] for left hand cellulitis... ~  [facility] 11/11 by [facility] for right arm cellulitis... ~  2/12:  exam w/ persist incr LE edema w/ pitting> rec to incr LASIX 40==>80mg /d.  HYPERLIPIDEMIA (ICD-272.4) - on LIPITOR 20mg /d & prev FLP at goal on this dose... ~  FLP 5/08 showed TChol 124, Tg 99, HDL 40, LDL 65 ~  FLP 5/09 showed TChol 120, TG 93, HDL 36, LDL 66 ~  FLP 10/10 showed TChol 123, TG 96, HDL 47, LDL 57 ~  FLP 7/12 showed TChol 105, TG 69, HDL 55, LDL 36  GERD (ICD-530.81) - on PEPCID 20mg /d...  DIVERTICULOSIS OF COLON (ICD-562.10) IRRITABLE BOWEL SYNDROME (ICD-564.1) - he has diarrhea (uses Immodium) & constipation (uses Miralax/ Senakot-S) w/ a tough job regulating (uses ALIGN)... COLONIC POLYPS (ICD-211.3) - last colonoscopy 8/99 showed divertics, no recurrent polyps... last polyps removed 1996= adenomatous... also had incidental cecal lipoma seen.  Hx of PROSTATE CANCER (ICD-185) - s/p radical prostatectomy in 1994... he is followed by DrWrenn & has urinary incontinence for which he uses pads etc... ~  labs 10/10 showed PSA= 0.03 ~  labs 7/11 showed PSA= 0.04 ~  Labs 7/12 showed PSA=  0.08  DEGENERATIVE JOINT DISEASE (ICD-715.90) - he has a known lumbar disc disease and cervical spondylosis... he also takes Vit D 1000 u daily...  ~  labs 7/11 showed Vit D level = 30... continue OTC supplement.  Hx of STROKE (ICD-434.91) - he was hospitalized 8/04 w/ stroke and MRI showed acute left pontine infarct + diffuse intracranial atherosclerotic changes- see above- on ASA & Coumadin.  PERIPHERAL NEUROPATHY (ICD-356.9) - he has LBP, neuropathy and a gait abnormality...  ANXIETY DEPRESSION (ICD-300.4) - wife, Kathie Rhodes, died 2022-07-03 on hospice (severe pulm fibrosis from scleroderma/ CREST/ etc)...   Past Surgical History  Procedure Date  . Prostateectomy   . Cataract extraction   . Inguinal hernia repair   . Carotid endarterectomy     right  . Abdominal aortic aneurysm repair     Outpatient Encounter Prescriptions as of 01/04/2011  Medication Sig Dispense Refill  . albuterol (PROAIR HFA) 108 (90 BASE) MCG/ACT inhaler Inhale 1-2 puffs into the lungs every 6 (six) hours as needed.  1 Inhaler  11  . aspirin 81 MG tablet Take 81 mg by mouth daily.        Marland Kitchen atorvastatin (LIPITOR) 20 MG tablet take 1 tablet by mouth once daily  30 tablet  5  . calcium carbonate (TUMS - DOSED IN MG ELEMENTAL CALCIUM) 500 MG chewable tablet As needed       . cholecalciferol (VITAMIN D) 1000 UNITS tablet Take 1,000 Units by mouth daily.        . famotidine (PEPCID) 10 MG tablet Take 10 mg by mouth at bedtime.        . fluticasone (FLONASE) 50 MCG/ACT nasal spray instill 2 sprays into each nostril once daily  16 g  11  . Fluticasone-Salmeterol (ADVAIR DISKUS) 250-50 MCG/DOSE AEPB Inhale 1 puff into the lungs 2 (two) times daily. Rinse mouth well  60 each  11  . furosemide (LASIX) 40 MG tablet        . KLOR-CON M20 20 MEQ tablet take 1 tablet by  mouth once daily  30 tablet  4  . oxyCODONE (OXY IR/ROXICODONE) 5 MG immediate release tablet Take 1 tab every 4-6 hours as needed pain as needed only per pt       .  Polyethyl Glycol-Propyl Glycol (SYSTANE) 0.4-0.3 % SOLN Apply one drop in each eyetwo times a day       . predniSONE (DELTASONE) 10 MG tablet        . PROBIOTIC CAPS Take 1 cap daily       . sennosides-docusate sodium (SENOKOT-S) 8.6-50 MG tablet Use as needed for constipation       . warfarin (COUMADIN) 4 MG tablet TAKE AS DIRECTED BY COUMADIN CLINIC  100 tablet  2  . DISCONTD: furosemide (LASIX) 40 MG tablet TAKE 1 TO 2 TABLETS ONCE DAILY.  60 tablet  11  . DISCONTD: predniSONE (DELTASONE) 10 MG tablet take 1/2 tablet by mouth once daily  30 tablet  0  . DISCONTD: lactulose (CHRONULAC) 10 GM/15ML solution Once daily       . DISCONTD: predniSONE (DELTASONE) 10 MG tablet          Allergies  Allergen Reactions  . Amoxicillin     REACTION: rash    Current Medications, Allergies, Past Medical History, Past Surgical History, Family History, and Social History were reviewed in [employer] record.   Review of Systems         See HPI - all other systems neg except as noted... The patient complains of decreased hearing, dyspnea on exertion, peripheral edema, muscle weakness, difficulty walking, and depression.  The patient denies anorexia, fever, weight loss, weight gain, vision loss, hoarseness, chest pain, syncope, prolonged cough, headaches, hemoptysis, abdominal pain, melena, hematochezia, severe indigestion/heartburn, hematuria, incontinence, suspicious skin lesions, transient blindness, unusual weight change, abnormal bleeding, enlarged lymph nodes, and angioedema.     Objective:   Physical Exam     WD, sl thin, 77 y/o WM in NAD... GENERAL:  Alert & oriented; pleasant & cooperative... HEENT:  [place]/AT, EOM-wnl, PERRLA, EACs-clear, TMs-wnl, NOSE-clear, THROAT-clear & wnl. NECK:  Supple w/ fairROM; no JVD; s/p right CAE, no bruits; no thyromegaly or nodules palpated; no lymphadenopathy. CHEST: decr BS bilat- few rhonchi in RLL area, no consolidation, no  wheezing... HEART:  sl irregular rhythm; w/o murmur, rubs, or gallop heard... ABDOMEN:  Soft & nontender; normal bowel sounds; no organomegaly or masses detected. EXT: without deformities, mod arthritic changes; ambulates w/ walker; +venous stasis, 2+edema noted. NEURO:  CN's intact;  gait abn; no focal neuro deficits,  +generalized weakness... DERM:  No lesions noted; no rash etc...  RADIOLOGY DATA:  Reviewed in the EPIC EMR & discussed w/ the patient...  LABORATORY DATA:  Reviewed in the EPIC EMR & discussed w/ the patient...   Assessment & Plan:   Hx of Bell's Palsey>  S/p Right Bell's Palsey 3/12, treated w/ Valtrex & Dosepak & resolved back to baseline...  HBP>  Remains under good control w/ his diet + Lasix monotherapy;  Due to his persist edema & labs==> keep Lasix to 80mg  Qam...  AFib>  Followed by Walker Kehr for cards, on Coumadin via clinic, controlled VR...  VASC DISEASE>  He has Cerebrovasc dis & Periph vasc dis followed by Walker Kehr & [facility]- DrEarly, Windell Moulding;  No ischemic symptoms, remains stable...  Venous Insuffic w/ Edema>  As above we will keep the Lasix to 80mg /d....  Other medical problems as noted>  Gi, GU (prostate cancer), DJD, Neuropathy, etc...   Patient's  Medications  New Prescriptions   No medications on file  Previous Medications   ALBUTEROL (PROAIR HFA) 108 (90 BASE) MCG/ACT INHALER    Inhale 1-2 puffs into the lungs every 6 (six) hours as needed.   ASPIRIN 81 MG TABLET    Take 81 mg by mouth daily.     ATORVASTATIN (LIPITOR) 20 MG TABLET    take 1 tablet by mouth once daily   CALCIUM CARBONATE (TUMS - DOSED IN MG ELEMENTAL CALCIUM) 500 MG CHEWABLE TABLET    As needed    CHOLECALCIFEROL (VITAMIN D) 1000 UNITS TABLET    Take 1,000 Units by mouth daily.     DEXTROMETHORPHAN-GUAIFENESIN (MUCINEX DM) 30-600 MG PER 12 HR TABLET    Take 1 tablet by mouth every 12 (twelve) hours.     FAMOTIDINE (PEPCID) 10 MG TABLET    Take 10 mg by mouth at bedtime.      FLUTICASONE (FLONASE) 50 MCG/ACT NASAL SPRAY    instill 2 sprays into each nostril once daily   FLUTICASONE-SALMETEROL (ADVAIR DISKUS) 250-50 MCG/DOSE AEPB    Inhale 1 puff into the lungs 2 (two) times daily. Rinse mouth well   KLOR-CON M20 20 MEQ TABLET    take 1 tablet by mouth once daily   OXYCODONE (OXY IR/ROXICODONE) 5 MG IMMEDIATE RELEASE TABLET    Take 1 tab every 4-6 hours as needed pain as needed only per pt    PROBIOTIC CAPS    Take 1 cap daily    SENNOSIDES-DOCUSATE SODIUM (SENOKOT-S) 8.6-50 MG TABLET    Use as needed for constipation    WARFARIN (COUMADIN) 4 MG TABLET    TAKE AS DIRECTED BY COUMADIN CLINIC  Modified Medications   Modified Medication Previous Medication   FUROSEMIDE (LASIX) 40 MG TABLET furosemide (LASIX) 40 MG tablet      Take 80 mg by mouth daily.     TAKE 1 TO 2 TABLETS ONCE DAILY.   PREDNISONE (DELTASONE) 10 MG TABLET predniSONE (DELTASONE) 10 MG tablet      Take 5 mg by mouth every other day.     take 1/2 tablet by mouth once daily  Discontinued Medications   LACTULOSE (CHRONULAC) 10 GM/15ML SOLUTION    Once daily    POLYETHYL GLYCOL-PROPYL GLYCOL (SYSTANE) 0.4-0.3 % SOLN    Apply one drop in each eyetwo times a day    PREDNISONE (DELTASONE) 10 MG TABLET

## 2011-01-10 ENCOUNTER — Ambulatory Visit (INDEPENDENT_AMBULATORY_CARE_PROVIDER_SITE_OTHER): Payer: Medicare Other | Admitting: *Deleted

## 2011-01-10 DIAGNOSIS — Z7901 Long term (current) use of anticoagulants: Secondary | ICD-10-CM

## 2011-01-10 DIAGNOSIS — I4891 Unspecified atrial fibrillation: Secondary | ICD-10-CM

## 2011-01-10 DIAGNOSIS — I2699 Other pulmonary embolism without acute cor pulmonale: Secondary | ICD-10-CM

## 2011-01-10 DIAGNOSIS — I82409 Acute embolism and thrombosis of unspecified deep veins of unspecified lower extremity: Secondary | ICD-10-CM

## 2011-01-10 LAB — POCT INR: INR: 2.8

## 2011-01-17 ENCOUNTER — Telehealth: Payer: Self-pay | Admitting: Pulmonary Disease

## 2011-01-17 ENCOUNTER — Encounter: Payer: Self-pay | Admitting: Adult Health

## 2011-01-17 ENCOUNTER — Ambulatory Visit (INDEPENDENT_AMBULATORY_CARE_PROVIDER_SITE_OTHER): Payer: Medicare Other | Admitting: Adult Health

## 2011-01-17 ENCOUNTER — Ambulatory Visit (INDEPENDENT_AMBULATORY_CARE_PROVIDER_SITE_OTHER)
Admission: RE | Admit: 2011-01-17 | Discharge: 2011-01-17 | Disposition: A | Discharge status: Home / Self Care | Payer: Medicare Other | Source: Ambulatory Visit | Attending: Adult Health | Admitting: Adult Health

## 2011-01-17 ENCOUNTER — Other Ambulatory Visit: Payer: Medicare Other

## 2011-01-17 VITALS — BP 122/78 | HR 62 | Temp 98.4°F | Ht 72.0 in | Wt 170.2 lb

## 2011-01-17 DIAGNOSIS — L03119 Cellulitis of unspecified part of limb: Secondary | ICD-10-CM

## 2011-01-17 DIAGNOSIS — L02619 Cutaneous abscess of unspecified foot: Secondary | ICD-10-CM

## 2011-01-17 DIAGNOSIS — L03115 Cellulitis of right lower limb: Secondary | ICD-10-CM

## 2011-01-17 MED ORDER — DOXYCYCLINE HYCLATE 100 MG PO TABS: 100.0000 mg | ORAL_TABLET | Freq: Two times a day (BID) | ORAL | Status: AC

## 2011-01-17 NOTE — Progress Notes (Signed)
Subjective:    Patient ID: GINA COSTILLA, male    DOB: 02-May-1922, 77 y.o.   MRN: [id number]  HPI 77 y/o WM here for -followed for general medical purposes w/ hx of recurrent bronchitis & allergies; hx PTE/ DVT/ & IVC filter on Coumadin; HBP; AFib; cerebrovasc dis w/ hx stroke & prev right CAE; ASPVD w/ prev AAA repair & subseq bilat Ao-iliac bypasses; Hyperchol; Prostate cancer; neuropathy; and anxiety/ depression...  ~  February 02, 2010:  DrNishan sent [facility] to [facility]- DrEarly 8/11 w/ diffuse aneurysmal changes (s/p AAA repair 2000 & right CAE 2001, hx DVT & IVC filter on Coumadin);  of greatest concern is ~5cm left pop art aneurysm but he is felt to be too high risk for surg therefore rec observation alone & he will f/u w/ Walker Kehr for [employer]...  he was hosp by [facility] 11/11 for right arm cellulitis & ?pneumonia> treated w/ Vanco/ Avelox (cults neg, pos MRSA screen)... he went to [facility] for rehab now back home- lives alone w/ help 8H per day (son has Parkinson's, & pt won't move to [place] w/ daugh)... he has mult somatic complaints> eyes "brushed by [facility]engack at [facility]"; weak & BP low; constipated; legs swelling; wants cushion for wheelchair; etc...   ~  March 22, 2010:   6 wk ROV- still weak but improving slowly...    He has mult somatic complaints but overall appears stable;  he is in AFib clinically w/ controlled VR & HR=90, continues on Coumadin via CC;  he has persist LE edema w/ pitting & wt up 3#> on Lasix 40mg /d, states he's restricting sodium (but eating TV dinners), elevating legs, wearing support hose;  BP well controlled at 116/80 & we discussed 2gm sodium restriction & incr Lasix 40-80mg  daily...    Breathing is stable & he's been on Pred 10mg - 1/2 daily;  we discussed weaning to 5mg  Qod, continue Advair, etc...    He has a prob w/ his right hand> can't dorsiflex the 3rd-4th fingers & has appt w/ DrWeingold to eval...    He notes some constipation & we have rec Miralax daily if  necessary for regulation, along w/ his Senakot-S, Align, etc...  ~  June 01, 2010:  40mo ROV- generally stable & gaining strength slowly (he has good exerc program, still has help at home [facility] per day);  He had right Bell's Palsey 3/12 & went to ER then called [place] & we Rx w/ Valtrex & Pred Dosepak ==> resolved & back to normal now;  Breathing stable on the Pred 5mg  Qod, BP looks good & edema is sl better w/ his incr to 60mg /d> we discussed checking BMet (K=4.6, BUN=22, Creat=1.1) and incr Lasix to 80mg  Qam;  CV stable w/o CP or cerebral ischemic symptoms...  We reviewed his meds & decided to continue others the same for now...  ~  September 01, 2010:  40mo ROv & he remains stable, had SHINGLES vaccine 6/12 & we gave him the TDAP today; he follows a low sodium diet & will return for FASTINg blood work next week... See prob list below>>    01/17/11 Acute OV  Complains of pt c/o swelling and redness to right leg and ankle.  Pt states it has has some heat to the area as well. all x 4 days,  He is on coumadin with recent check /therapeutic.  Right lower leg is tender and red. No drainage. No known injury.  No fever. Feels hot to touch . He is  accompanied by his sitter today.           Problem List:  OPHTHALMOLOGY - followed by [facility]... ~  7/12:  He reports that recent check up was OK...  ALLERGIC RHINITIS (ICD-477.9) - on OTC Antihist Prn and FLONASE Qhs...  BRONCHITIS, RECURRENT (ICD-491.9) - on ADVAIR 250Bid & PROVENTIL HFA Prn... ~  CXR 7/11 showed some hyperinflation, no change scarring & vol loss RLL, NAD.Marland Kitchen. ~  CXR 11/11 in hosp showed scarring right base w/ incr opac r/o superimposed pneum > he was placed on Pred during this adm & weaned slowly as outpt... ~  CXR 12/11 here showed baseline film w/ basilar scarring & NAD... on Pred 10mg /d & asked to wean to 5mg /d... ~  4/12:  Chest remains clear, pt denies much cough, phlegm, ch in DOE, ch in edema, etc...  Hx of PULMONARY EMBOLISM  (ICD-415.19) - he had a DVT w/ PTE in 2001 w/ IVC filter placed... he remains on COUMADIN w/ regular checks in the Coumadin Clinic- doing well.  HYPERTENSION (ICD-401.9) - on LASIX monotherapy (currently 80mg /d) w/ K20/d... ~  labs 10/10 showed BUN= 17, Creat= 1.0, K= 4.7 ~  11/10: c/o incr dizzy & weak- rec to decr the Lasix to 1/2 tab daily... ~  2/11:  hosp records show norm CBC, BMet, TSH, Urine. ~  7/11: c/o incr edema & wants to incr Lasix to 1-2 Qam; BUN=28, Creat=1.2, K=4.2, BNP=97 ~  Labs 11/11 in hosp on Lasix 40mg /d showed BUN/ Creat= 29/ 1.5 ==> 18/ 0.9 & BNP= 113. ~  4/12:  On Lasix40mg - 1.5 tabsQam & BP= 118/72; notes some fatigue, intermittent dyspnea, dizziness, & edema; but denies HA, visual changes, CP, palipit, syncope, etc; labs show K=4.6, BUN=22, Creat=1.1, rec incr Lasix to 80mg  Qam... ~  7/12:  BP= 124/82 on Lasix 80mg /d.  PAROXYSMAL ATRIAL FIBRILLATION (ICD-427.31) - prev on Sotalol80 but stopped 12/10 by DrNishan...  remains on Coumadin via clinic... he was prev maintaining NSR but has slipped back into AFib w/ controlled VR... ~  Last 2DEcho 2004 showed EF 50-55% w/ infer HK, incr AoV thickness, mild AI, mild LAdil, no thrombus seen... ~  EKG 12/10 showed SBrady w/ RBBB & 1st degree AVB... ~  6/11:  Last seen by DrNishan> he was still regular w/ pulse= 62... ~  Noted to be irreg 12/11 OV & clinical AFib w/ conrolled VR 2/12 OV  CEREBROVASCULAR DISEASE (ICD-437.9) - on ASA 81mg /d in addition to the Coumadin...  ~  he is s/p right carotid endarterectomy 7/01 by Windell Moulding...  ~  MRI 8/04 showed intracranial atherosclerotic changes w/ marked ectasia of V-B sys, sm right vertebral w/ prox stenosis, & >50% left ICA stenosis...  ~  CT Brain 1/09 showed atrophy and chr microvacs ischemic changes & remote IC lacune... ~  CDoppler 5/09 showed patent right CAE w/ DPA, mild plaque in bulb, 0-39% bilat ICA stenoses- no change... ~  CDoppler 5/10 showed patent right CAE w/ DPA,  stable mild left carotid dis, 0-39% bilat- f/u 75yr.  PERIPHERAL VASCULAR DISEASE (ICD-443.9) - s/p AAA repair 8/00 & bilat Ao to iliac bypasses in 2001 by DrLawson... he had mult post-op complications and a long hosp course... vasc f/u by Nolon Stalls for [facility] [place] [facility]... ~  2011:  f/u vasc eval by Walker Kehr & referral to DrEarly for review> diffuse aneurysmal changes w/ 5cm left Pop art aneurysm- he was felt to be too high risk for surg & they rec BP  control & observation only...   VENOUS INSUFFICIENCY, CHRONIC (ICD-459.81) - chr ven insuffic due to his mult DVT episodes & prev IVC filter ?2001... he's had incr trouble w/ LE edema recently & requires incr Lasix dosing... ~  Venous Dopplers right leg 8/09 showed no evid for DVT, no superfic clots, etc... ~  8/09 developed cellulitis right leg which resolved to Keflex & local care... ~  [facility] 2/11 by [facility] for left hand cellulitis... ~  [facility] 11/11 by [facility] for right arm cellulitis... ~  2/12:  exam w/ persist incr LE edema w/ pitting> rec to incr LASIX 40==>80mg /d.  HYPERLIPIDEMIA (ICD-272.4) - on LIPITOR 20mg /d & prev FLP at goal on this dose... ~  FLP 5/08 showed TChol 124, Tg 99, HDL 40, LDL 65 ~  FLP 5/09 showed TChol 120, TG 93, HDL 36, LDL 66 ~  FLP 10/10 showed TChol 123, TG 96, HDL 47, LDL 57  GERD (ICD-530.81) - on PEPCID 20mg /d...  DIVERTICULOSIS OF COLON (ICD-562.10) IRRITABLE BOWEL SYNDROME (ICD-564.1) - he has diarrhea (uses Immodium) & constipation (uses Miralax/ Senakot-S) w/ a tough job regulating (uses ALIGN)... COLONIC POLYPS (ICD-211.3) - last colonoscopy 8/99 showed divertics, no recurrent polyps... last polyps removed 1996= adenomatous... also had incidental cecal lipoma seen.  Hx of PROSTATE CANCER (ICD-185) - s/p radical prostatectomy in 1994... he is followed by DrWrenn & has urinary incontinence for which he uses pads etc... ~  labs 10/10 showed PSA= 0.03 ~  labs 7/11 showed PSA= 0.04  DEGENERATIVE JOINT DISEASE  (ICD-715.90) - he has a known lumbar disc disease and cervical spondylosis... he also takes Vit D 1000 u daily...  ~  labs 7/11 showed Vit D level = 30... continue OTC supplement.  Hx of STROKE (ICD-434.91) - he was hospitalized 8/04 w/ stroke and MRI showed acute left pontine infarct + diffuse intracranial atherosclerotic changes- see above- on ASA & Coumadin.  PERIPHERAL NEUROPATHY (ICD-356.9) - he has LBP, neuropathy and a gait abnormality...  ANXIETY DEPRESSION (ICD-300.4) - wife, Kathie Rhodes, died 2022-07-07 on hospice (severe pulm fibrosis from scleroderma/ CREST/ etc)...    Review of Systems Constitutional:   No  weight loss, night sweats,  Fevers, chills, fatigue, or  lassitude.  HEENT:   No headaches,  Difficulty swallowing,  Tooth/dental problems, or  Sore throat,                No sneezing, itching, ear ache, nasal congestion, post nasal drip,   CV:  No chest pain,  Orthopnea, PND, swelling in lower extremities, anasarca, dizziness, palpitations, syncope.   GI  No heartburn, indigestion, abdominal pain, nausea, vomiting, diarrhea, change in bowel habits, loss of appetite, bloody stools.   Resp: No shortness of breath with exertion or at rest.  No excess mucus, no productive cough,  No non-productive cough,  No coughing up of blood.  No change in color of mucus.  No wheezing.  No chest wall deformity  Skin: no rash or lesions.  GU: no dysuria, change in color of urine, no urgency or frequency.  No flank pain, no hematuria   MS:  No joint pain or swelling.  No decreased range of motion.  No back pain.  Psych:  No change in mood or affect. No depression or anxiety.  No memory loss.         Objective:   Physical Exam GEN: A/Ox3; pleasant , NAD, elderly   HEENT:  [place]/AT,  EACs-clear, TMs-wnl, NOSE-clear, THROAT-clear, no lesions, no postnasal drip or exudate  noted.   NECK:  Supple w/ fair ROM; no JVD; normal carotid impulses w/o bruits; no thyromegaly or nodules palpated; no  lymphadenopathy.  RESP  Clear  P & A; w/o, wheezes/ rales/ or rhonchi.no accessory muscle use, no dullness to percussion  CARD:  Irregular , no m/r/g  , 1 + peripheral edema, pulses intact, no cyanosis or clubbing.  GI:   Soft & nt; nml bowel sounds; no organomegaly or masses detected.  Musco: Warm bil, along right lower leg with significant redness , hot to touch, no drainage or skin breakage noted.   Neuro: alert, no focal deficits noted.    Skin: Warm, no lesions or rashes         Assessment & Plan:

## 2011-01-17 NOTE — Telephone Encounter (Signed)
Spoke with pt. He states rt foot/leg started to swell worse and is painful to put shoe on. He states that area is red and warm to touch. OV with TP at 4:30 pm.

## 2011-01-17 NOTE — Patient Instructions (Signed)
Begin Doxycycline 100mg  Twice daily  For 10 days  I will call with xray results.  Keep leg elevated.  Wash with soap and water, pat dry , and elevate as much as possible  follow up 1 week and As needed

## 2011-01-19 NOTE — Assessment & Plan Note (Signed)
Wound cx pending.  Begin Doxycycline 100mg  Twice daily  For 10 days  I will call with xray results.  Keep leg elevated.  Wash with soap and water, pat dry , and elevate as much as possible  follow up 1 week and As needed

## 2011-01-20 LAB — WOUND CULTURE

## 2011-01-24 ENCOUNTER — Other Ambulatory Visit (INDEPENDENT_AMBULATORY_CARE_PROVIDER_SITE_OTHER): Payer: Medicare Other

## 2011-01-24 ENCOUNTER — Ambulatory Visit (INDEPENDENT_AMBULATORY_CARE_PROVIDER_SITE_OTHER): Payer: Medicare Other | Admitting: Adult Health

## 2011-01-24 ENCOUNTER — Encounter: Payer: Self-pay | Admitting: Adult Health

## 2011-01-24 VITALS — BP 100/70 | HR 83 | Temp 98.2°F | Ht 72.0 in | Wt 170.6 lb

## 2011-01-24 DIAGNOSIS — L03119 Cellulitis of unspecified part of limb: Secondary | ICD-10-CM

## 2011-01-24 DIAGNOSIS — L03115 Cellulitis of right lower limb: Secondary | ICD-10-CM

## 2011-01-24 DIAGNOSIS — R0602 Shortness of breath: Secondary | ICD-10-CM

## 2011-01-24 LAB — CBC WITH DIFFERENTIAL/PLATELET
Basophils Absolute: 0.1 10*3/uL (ref 0.0–0.1)
Eosinophils Absolute: 0.1 10*3/uL (ref 0.0–0.7)
Lymphocytes Relative: 30.8 % (ref 12.0–46.0)
MCHC: 33.9 g/dL (ref 30.0–36.0)
Neutrophils Relative %: 57.6 % (ref 43.0–77.0)
Platelets: 239 10*3/uL (ref 150.0–400.0)
RDW: 14.7 % — ABNORMAL HIGH (ref 11.5–14.6)

## 2011-01-24 LAB — BASIC METABOLIC PANEL
Chloride: 103 mEq/L (ref 96–112)
Creatinine, Ser: 1 mg/dL (ref 0.4–1.5)
Potassium: 4.4 mEq/L (ref 3.5–5.1)
Sodium: 139 mEq/L (ref 135–145)

## 2011-01-24 LAB — BRAIN NATRIURETIC PEPTIDE: Pro B Natriuretic peptide (BNP): 237 pg/mL — ABNORMAL HIGH (ref 0.0–100.0)

## 2011-01-24 NOTE — Patient Instructions (Signed)
Finish antibtotics  Keep legs elevated as much as possible.  Please contact office for sooner follow up if symptoms do not improve or worsen or seek emergency care   follow up Dr. Kriste Basque in 4 weeks and As needed

## 2011-01-24 NOTE — Assessment & Plan Note (Addendum)
Improving with abx.  Cont w/ elevation and finish abx  Labs pending.   Plan:  Finish antibtotics  Keep legs elevated as much as possible.  Please contact office for sooner follow up if symptoms do not improve or worsen or seek emergency care   follow up Dr. Kriste Basque in 4 weeks and As needed

## 2011-01-24 NOTE — Progress Notes (Signed)
Subjective:    Patient ID: Glenn Barron, male    DOB: 1922/07/18, 77 y.o.   MRN: [id number]  HPI  77 y/o WM here for -followed for general medical purposes w/ hx of recurrent bronchitis & allergies; hx PTE/ DVT/ & IVC filter on Coumadin; HBP; AFib; cerebrovasc dis w/ hx stroke & prev right CAE; ASPVD w/ prev AAA repair & subseq bilat Ao-iliac bypasses; Hyperchol; Prostate cancer; neuropathy; and anxiety/ depression...  ~  February 02, 2010:  DrNishan sent him to [facility]- DrEarly 8/11 w/ diffuse aneurysmal changes (s/p AAA repair 2000 & right CAE 2001, hx DVT & IVC filter on Coumadin);  of greatest concern is ~5cm left pop art aneurysm but he is felt to be too high risk for surg therefore rec observation alone & he will f/u w/ Walker Kehr for [employer]...  he was hosp by [facility] 11/11 for right arm cellulitis & ?pneumonia> treated w/ Vanco/ Avelox (cults neg, pos MRSA screen)... he went to [facility] for rehab now back home- lives alone w/ help 8H per day (son has Parkinson's, & pt won't move to [place] w/ daugh)... he has mult somatic complaints> eyes "brushed by [facility]igengack at [facility]"; weak & BP low; constipated; legs swelling; wants cushion for wheelchair; etc...   ~  March 22, 2010:   6 wk ROV- still weak but improving slowly...    He has mult somatic complaints but overall appears stable;  he is in AFib clinically w/ controlled VR & HR=90, continues on Coumadin via CC;  he has persist LE edema w/ pitting & wt up 3#> on Lasix 40mg /d, states he's restricting sodium (but eating TV dinners), elevating legs, wearing support hose;  BP well controlled at 116/80 & we discussed 2gm sodium restriction & incr Lasix 40-80mg  daily...    Breathing is stable & he's been on Pred 10mg - 1/2 daily;  we discussed weaning to 5mg  Qod, continue Advair, etc...    He has a prob w/ his right hand> can't dorsiflex the 3rd-4th fingers & has appt w/ DrWeingold to eval...    He notes some constipation & we have rec Miralax daily if  necessary for regulation, along w/ his Senakot-S, Align, etc...  ~  June 01, 2010:  87mo ROV- generally stable & gaining strength slowly (he has good exerc program, still has help at home [employer] per day);  He had right Bell's Palsey 3/12 & went to ER then called [place] & we Rx w/ Valtrex & Pred Dosepak ==> resolved & back to normal now;  Breathing stable on the Pred 5mg  Qod, BP looks good & edema is sl better w/ his incr to 60mg /d> we discussed checking BMet (K=4.6, BUN=22, Creat=1.1) and incr Lasix to 80mg  Qam;  CV stable w/o CP or cerebral ischemic symptoms...  We reviewed his meds & decided to continue others the same for now...  ~  September 01, 2010:  87mo ROv & he remains stable, had SHINGLES vaccine 6/12 & we gave him the TDAP today; he follows a low sodium diet & will return for FASTINg blood work next week... See prob list below>>    01/17/11 Acute OV  Complains of pt c/o swelling and redness to right leg and ankle.  Pt states it has has some heat to the area as well. all x 4 days,  He is on coumadin with recent check /therapeutic.  Right lower leg is tender and red. No drainage. No known injury.  No fever. Feels hot to touch . He  is accompanied by his sitter today.  >rx doxycycline x 10 days , wound cx neg   01/24/2011 follow up  Pt returns for follow up . Last ov right lower leg cellulits, tx w/ abx doxycycline x 10 days.  Wound cx was neg. He returns today with CNA. Feeling much better with decreased redness and swelling. Remains therapeutic on coumadin.  No chest pain, fever or calf pain.            Problem List:  OPHTHALMOLOGY - followed by [facility]... ~  7/12:  He reports that recent check up was OK...  ALLERGIC RHINITIS (ICD-477.9) - on OTC Antihist Prn and FLONASE Qhs...  BRONCHITIS, RECURRENT (ICD-491.9) - on ADVAIR 250Bid & PROVENTIL HFA Prn... ~  CXR 7/11 showed some hyperinflation, no change scarring & vol loss RLL, NAD.Marland Kitchen. ~  CXR 11/11 in hosp showed scarring right base w/  incr opac r/o superimposed pneum > he was placed on Pred during this adm & weaned slowly as outpt... ~  CXR 12/11 here showed baseline film w/ basilar scarring & NAD... on Pred 10mg /d & asked to wean to 5mg /d... ~  4/12:  Chest remains clear, pt denies much cough, phlegm, ch in DOE, ch in edema, etc...  Hx of PULMONARY EMBOLISM (ICD-415.19) - he had a DVT w/ PTE in 2001 w/ IVC filter placed... he remains on COUMADIN w/ regular checks in the Coumadin Clinic- doing well.  HYPERTENSION (ICD-401.9) - on LASIX monotherapy (currently 80mg /d) w/ K20/d... ~  labs 10/10 showed BUN= 17, Creat= 1.0, K= 4.7 ~  11/10: c/o incr dizzy & weak- rec to decr the Lasix to 1/2 tab daily... ~  2/11:  hosp records show norm CBC, BMet, TSH, Urine. ~  7/11: c/o incr edema & wants to incr Lasix to 1-2 Qam; BUN=28, Creat=1.2, K=4.2, BNP=97 ~  Labs 11/11 in hosp on Lasix 40mg /d showed BUN/ Creat= 29/ 1.5 ==> 18/ 0.9 & BNP= 113. ~  4/12:  On Lasix40mg - 1.5 tabsQam & BP= 118/72; notes some fatigue, intermittent dyspnea, dizziness, & edema; but denies HA, visual changes, CP, palipit, syncope, etc; labs show K=4.6, BUN=22, Creat=1.1, rec incr Lasix to 80mg  Qam... ~  7/12:  BP= 124/82 on Lasix 80mg /d.  PAROXYSMAL ATRIAL FIBRILLATION (ICD-427.31) - prev on Sotalol80 but stopped 12/10 by Walker Kehr...  remains on Coumadin via clinic... he was prev maintaining NSR but has slipped back into AFib w/ controlled VR... ~  Last 2DEcho 2004 showed EF 50-55% w/ infer HK, incr AoV thickness, mild AI, mild LAdil, no thrombus seen... ~  EKG 12/10 showed SBrady w/ RBBB & 1st degree AVB... ~  6/11:  Last seen by DrNishan> he was still regular w/ pulse= 62... ~  Noted to be irreg 12/11 OV & clinical AFib w/ conrolled VR 2/12 OV  CEREBROVASCULAR DISEASE (ICD-437.9) - on ASA 81mg /d in addition to the Coumadin...  ~  he is s/p right carotid endarterectomy 7/01 by Windell Moulding...  ~  MRI 8/04 showed intracranial atherosclerotic changes w/ marked  ectasia of V-B sys, sm right vertebral w/ prox stenosis, & >50% left ICA stenosis...  ~  CT Brain 1/09 showed atrophy and chr microvacs ischemic changes & remote IC lacune... ~  CDoppler 5/09 showed patent right CAE w/ DPA, mild plaque in bulb, 0-39% bilat ICA stenoses- no change... ~  CDoppler 5/10 showed patent right CAE w/ DPA, stable mild left carotid dis, 0-39% bilat- f/u 66yr.  PERIPHERAL VASCULAR DISEASE (ICD-443.9) - s/p AAA repair 8/00 &  bilat Ao to iliac bypasses in 2001 by DrLawson... he had mult post-op complications and a long hosp course... vasc f/u by Nolon Stalls for [facility]... 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   VENOUS INSUFFICIENCY, CHRONIC (ICD-459.81) - chr ven insuffic due to his mult DVT episodes & prev IVC filter ?2001... he's had incr trouble w/ LE edema recently & requires incr Lasix dosing... ~  Venous Dopplers right leg 8/09 showed no evid for DVT, no superfic clots, etc... ~  8/09 developed cellulitis right leg which resolved to Keflex & local care... ~  [facility] 2/11 by [facility] for left hand cellulitis... ~  [facility] 11/11 by [facility] for right arm cellulitis... ~  2/12:  exam w/ persist incr LE edema w/ pitting> rec to incr LASIX 40==>80mg /d.  HYPERLIPIDEMIA (ICD-272.4) - on LIPITOR 20mg /d & prev FLP at goal on this dose... ~  FLP 5/08 showed TChol 124, Tg 99, HDL 40, LDL 65 ~  FLP 5/09 showed TChol 120, TG 93, HDL 36, LDL 66 ~  FLP 10/10 showed TChol 123, TG 96, HDL 47, LDL 57  GERD (ICD-530.81) - on PEPCID 20mg /d...  DIVERTICULOSIS OF COLON (ICD-562.10) IRRITABLE BOWEL SYNDROME (ICD-564.1) - he has diarrhea (uses Immodium) & constipation (uses Miralax/ Senakot-S) w/ a tough job regulating (uses ALIGN)... COLONIC POLYPS (ICD-211.3) - last colonoscopy 8/99 showed divertics, no recurrent  polyps... last polyps removed 1996= adenomatous... also had incidental cecal lipoma seen.  Hx of PROSTATE CANCER (ICD-185) - s/p radical prostatectomy in 1994... he is followed by DrWrenn & has urinary incontinence for which he uses pads etc... ~  labs 10/10 showed PSA= 0.03 ~  labs 7/11 showed PSA= 0.04  DEGENERATIVE JOINT DISEASE (ICD-715.90) - he has a known lumbar disc disease and cervical spondylosis... he also takes Vit D 1000 u daily...  ~  labs 7/11 showed Vit D level = 30... continue OTC supplement.  Hx of STROKE (ICD-434.91) - he was hospitalized 8/04 w/ stroke and MRI showed acute left pontine infarct + diffuse intracranial atherosclerotic changes- see above- on ASA & Coumadin.  PERIPHERAL NEUROPATHY (ICD-356.9) - he has LBP, neuropathy and a gait abnormality...  ANXIETY DEPRESSION (ICD-300.4) - wife, Kathie Rhodes, died 2022/07/17 on hospice (severe pulm fibrosis from scleroderma/ CREST/ etc)...    Review of Systems  Constitutional:   No  weight loss, night sweats,  Fevers, chills,  +fatigue, or  lassitude.  HEENT:   No headaches,  Difficulty swallowing,  Tooth/dental problems, or  Sore throat,                No sneezing, itching, ear ache, nasal congestion, post nasal drip,   CV:  No chest pain,  Orthopnea, PND,  ++swelling in lower extremities- decreased  anasarca, dizziness, palpitations, syncope.   GI  No heartburn, indigestion, abdominal pain, nausea, vomiting, diarrhea, change in bowel habits, loss of appetite, bloody stools.   Resp: No shortness of breath with exertion or at rest.  No excess mucus, no productive cough,  No non-productive cough,  No coughing up of blood.  No change in color of mucus.  No wheezing.  No chest wall deformity  Skin: no rash or lesions.  GU: no dysuria, change in color of urine, no urgency or frequency.  No flank pain, no hematuria  MS:  No joint pain or swelling.  No decreased range of motion.  No back pain.  Psych:  No change in mood or  affect. No depression or anxiety.  No memory loss.         Objective:   Physical Exam  GEN: A/Ox3; pleasant , NAD, elderly   HEENT:  [place]/AT,  EACs-clear, TMs-wnl, NOSE-clear, THROAT-clear, no lesions, no postnasal drip or exudate noted.   NECK:  Supple w/ fair ROM; no JVD; normal carotid impulses w/o bruits; no thyromegaly or nodules palpated; no lymphadenopathy.  RESP  Clear  P & A; w/o, wheezes/ rales/ or rhonchi.no accessory muscle use, no dullness to percussion  CARD:  Irregular , no m/r/g  , 1 + peripheral edema-venous insufficiency , pulses intact, no cyanosis or clubbing.  GI:   Soft & nt; nml bowel sounds; no organomegaly or masses detected.  Musco: Warm bil, along right lower leg with decreased redness and swelling.   Neuro: alert, no focal deficits noted.    Skin: Warm, no lesions or rashes         Assessment & Plan:

## 2011-02-01 ENCOUNTER — Encounter: Payer: Self-pay | Admitting: Pulmonary Disease

## 2011-02-21 ENCOUNTER — Ambulatory Visit (INDEPENDENT_AMBULATORY_CARE_PROVIDER_SITE_OTHER): Payer: Medicare Other | Admitting: *Deleted

## 2011-02-21 DIAGNOSIS — Z7901 Long term (current) use of anticoagulants: Secondary | ICD-10-CM

## 2011-02-21 DIAGNOSIS — I2699 Other pulmonary embolism without acute cor pulmonale: Secondary | ICD-10-CM

## 2011-02-21 DIAGNOSIS — I4891 Unspecified atrial fibrillation: Secondary | ICD-10-CM

## 2011-02-21 DIAGNOSIS — I82409 Acute embolism and thrombosis of unspecified deep veins of unspecified lower extremity: Secondary | ICD-10-CM

## 2011-02-21 LAB — POCT INR: INR: 3.3

## 2011-02-22 ENCOUNTER — Encounter: Payer: Self-pay | Admitting: Pulmonary Disease

## 2011-02-22 ENCOUNTER — Ambulatory Visit (INDEPENDENT_AMBULATORY_CARE_PROVIDER_SITE_OTHER): Payer: Medicare Other | Admitting: Pulmonary Disease

## 2011-02-22 DIAGNOSIS — L02619 Cutaneous abscess of unspecified foot: Secondary | ICD-10-CM

## 2011-02-22 DIAGNOSIS — L03115 Cellulitis of right lower limb: Secondary | ICD-10-CM

## 2011-02-22 DIAGNOSIS — F341 Dysthymic disorder: Secondary | ICD-10-CM

## 2011-02-22 DIAGNOSIS — I6350 Cerebral infarction due to unspecified occlusion or stenosis of unspecified cerebral artery: Secondary | ICD-10-CM

## 2011-02-22 DIAGNOSIS — G609 Hereditary and idiopathic neuropathy, unspecified: Secondary | ICD-10-CM

## 2011-02-22 DIAGNOSIS — M1990 Unspecified osteoarthritis, unspecified site: Secondary | ICD-10-CM

## 2011-02-22 DIAGNOSIS — I509 Heart failure, unspecified: Secondary | ICD-10-CM

## 2011-02-22 DIAGNOSIS — I872 Venous insufficiency (chronic) (peripheral): Secondary | ICD-10-CM

## 2011-02-22 DIAGNOSIS — I739 Peripheral vascular disease, unspecified: Secondary | ICD-10-CM

## 2011-02-22 DIAGNOSIS — E785 Hyperlipidemia, unspecified: Secondary | ICD-10-CM

## 2011-02-22 DIAGNOSIS — R609 Edema, unspecified: Secondary | ICD-10-CM

## 2011-02-22 DIAGNOSIS — C61 Malignant neoplasm of prostate: Secondary | ICD-10-CM

## 2011-02-22 DIAGNOSIS — I10 Essential (primary) hypertension: Secondary | ICD-10-CM

## 2011-02-22 DIAGNOSIS — K219 Gastro-esophageal reflux disease without esophagitis: Secondary | ICD-10-CM

## 2011-02-22 MED ORDER — FUROSEMIDE 40 MG PO TABS: ORAL_TABLET | ORAL | Status: DC

## 2011-02-22 NOTE — Patient Instructions (Signed)
Today we updated your med list in our EPIC system...    We decided to keep the Prednisone 10mg - 1/2 tab every other day...    We decided to double your Lasix 40mg  to 2 tabs twice daily for the next month...  We will sched a follow up visit w/ me in one month's time...  Call for any questions.Marland KitchenMarland Kitchen

## 2011-02-27 ENCOUNTER — Other Ambulatory Visit: Payer: Self-pay | Admitting: Pulmonary Disease

## 2011-03-13 ENCOUNTER — Other Ambulatory Visit: Payer: Self-pay | Admitting: Pulmonary Disease

## 2011-03-14 ENCOUNTER — Ambulatory Visit (INDEPENDENT_AMBULATORY_CARE_PROVIDER_SITE_OTHER): Payer: Medicare Other | Admitting: *Deleted

## 2011-03-14 DIAGNOSIS — I4891 Unspecified atrial fibrillation: Secondary | ICD-10-CM

## 2011-03-14 DIAGNOSIS — I2699 Other pulmonary embolism without acute cor pulmonale: Secondary | ICD-10-CM

## 2011-03-14 DIAGNOSIS — Z7901 Long term (current) use of anticoagulants: Secondary | ICD-10-CM

## 2011-03-14 DIAGNOSIS — I82409 Acute embolism and thrombosis of unspecified deep veins of unspecified lower extremity: Secondary | ICD-10-CM

## 2011-03-15 ENCOUNTER — Telehealth: Payer: Self-pay | Admitting: Pulmonary Disease

## 2011-03-15 MED ORDER — POTASSIUM CHLORIDE CRYS ER 20 MEQ PO TBCR: 20.0000 meq | EXTENDED_RELEASE_TABLET | Freq: Two times a day (BID) | ORAL | Status: DC

## 2011-03-15 NOTE — Telephone Encounter (Signed)
Called and spoke with pt and he is aware per SN to take the potassium 20   1 po bid .  [name] aware that this has been sent to his pharmacy

## 2011-03-15 NOTE — Telephone Encounter (Signed)
Pt says Lasix has been doubled daily and at last ov SN had said to increase his potassium. Pt has been taking an extra potassium tablet on Tues. And Thurs., and needs a new prescription. Pls advise how much potassium pt needs to be on daily with increased lasix dosage to 80mg  twice daily. Allergies  Allergen Reactions  . Amoxicillin     REACTION: rash

## 2011-03-24 ENCOUNTER — Encounter: Payer: Self-pay | Admitting: Pulmonary Disease

## 2011-03-24 ENCOUNTER — Ambulatory Visit (INDEPENDENT_AMBULATORY_CARE_PROVIDER_SITE_OTHER): Payer: Medicare Other | Admitting: Pulmonary Disease

## 2011-03-24 ENCOUNTER — Other Ambulatory Visit (INDEPENDENT_AMBULATORY_CARE_PROVIDER_SITE_OTHER): Payer: Medicare Other

## 2011-03-24 DIAGNOSIS — R0600 Dyspnea, unspecified: Secondary | ICD-10-CM

## 2011-03-24 DIAGNOSIS — G609 Hereditary and idiopathic neuropathy, unspecified: Secondary | ICD-10-CM

## 2011-03-24 DIAGNOSIS — I4891 Unspecified atrial fibrillation: Secondary | ICD-10-CM

## 2011-03-24 DIAGNOSIS — I872 Venous insufficiency (chronic) (peripheral): Secondary | ICD-10-CM

## 2011-03-24 DIAGNOSIS — I10 Essential (primary) hypertension: Secondary | ICD-10-CM

## 2011-03-24 DIAGNOSIS — I2699 Other pulmonary embolism without acute cor pulmonale: Secondary | ICD-10-CM

## 2011-03-24 DIAGNOSIS — R269 Unspecified abnormalities of gait and mobility: Secondary | ICD-10-CM | POA: Insufficient documentation

## 2011-03-24 DIAGNOSIS — I739 Peripheral vascular disease, unspecified: Secondary | ICD-10-CM

## 2011-03-24 DIAGNOSIS — R609 Edema, unspecified: Secondary | ICD-10-CM

## 2011-03-24 DIAGNOSIS — M1990 Unspecified osteoarthritis, unspecified site: Secondary | ICD-10-CM

## 2011-03-24 DIAGNOSIS — I509 Heart failure, unspecified: Secondary | ICD-10-CM

## 2011-03-24 DIAGNOSIS — R0989 Other specified symptoms and signs involving the circulatory and respiratory systems: Secondary | ICD-10-CM

## 2011-03-24 DIAGNOSIS — I82409 Acute embolism and thrombosis of unspecified deep veins of unspecified lower extremity: Secondary | ICD-10-CM

## 2011-03-24 LAB — CBC WITH DIFFERENTIAL/PLATELET
Eosinophils Relative: 0.4 % (ref 0.0–5.0)
HCT: 48.5 % (ref 39.0–52.0)
Lymphs Abs: 2 10*3/uL (ref 0.7–4.0)
Monocytes Relative: 6.7 % (ref 3.0–12.0)
Neutrophils Relative %: 71.9 % (ref 43.0–77.0)
Platelets: 144 10*3/uL — ABNORMAL LOW (ref 150.0–400.0)
WBC: 9.6 10*3/uL (ref 4.5–10.5)

## 2011-03-24 LAB — BASIC METABOLIC PANEL
BUN: 32 mg/dL — ABNORMAL HIGH (ref 6–23)
GFR: 64.63 mL/min (ref >60.00)
Potassium: 5 mEq/L (ref 3.5–5.1)
Sodium: 139 mEq/L (ref 135–145)

## 2011-03-24 LAB — BRAIN NATRIURETIC PEPTIDE: Pro B Natriuretic peptide (BNP): 159 pg/mL — ABNORMAL HIGH (ref 0.0–100.0)

## 2011-03-24 NOTE — Patient Instructions (Signed)
Today we updated your med list in our EPIC system...    Continue your current medications the same...  Today we did your follow up blood work...    Please call the [facility]ONE [facility]REE in a few days for your results...    Dial [id number] & when prompted enter your patient number followed by the # symbol...    Your patient number is:  [id number]  We will arrange for a follow up eval by the Vasc Surgeons- DrEarly/ DrLawson to check your left leg...  Call for any questions...  Let's plan another check up in 6 weeks time.Marland KitchenMarland Kitchen

## 2011-03-25 ENCOUNTER — Other Ambulatory Visit: Payer: Self-pay | Admitting: *Deleted

## 2011-03-25 MED ORDER — FUROSEMIDE 40 MG PO TABS: ORAL_TABLET | ORAL | Status: DC

## 2011-03-27 NOTE — Progress Notes (Signed)
Subjective:    Patient ID: Glenn Barron, male    DOB: 1922-03-20, 78 y.o.   MRN: [id number]  HPI 78 y/o WM here for a follow up visit... he has mult med problems as noted below... Followed for general medical purposes w/ hx of recurrent bronchitis & allergies; hx PTE/ DVT/ & IVC filter on Coumadin; HBP; AFib; cerebrovasc dis w/ hx stroke & prev right CAE; ASPVD w/ prev AAA repair & subseq bilat Ao-iliac bypasses; Hyperchol; Prostate cancer; neuropathy; and anxiety/ depression...  ~  March 22, 2010:   6 wk ROV- still weak but improving slowly> He has mult somatic complaints but overall appears stable;  he is in AFib clinically w/ controlled VR & HR=90, continues on Coumadin via CC;  he has persist LE edema w/ pitting & wt up 3#> on Lasix 40mg /d, states he's restricting sodium (but eating TV dinners), elevating legs, wearing support hose;  BP well controlled at 116/80 & we discussed 2gm sodium restriction & incr Lasix 40-80mg  daily...    Breathing is stable & he's been on Pred 10mg - 1/2 daily;  we discussed weaning to 5mg  Qod, continue Advair, etc...    He has a prob w/ his right hand> can't dorsiflex the 3rd-4th fingers & has appt w/ DrWeingold to eval...    He notes some constipation & we have rec Miralax daily if necessary for regulation, along w/ his Senakot-S, Align, etc...  ~  June 01, 2010:  59mo ROV- generally stable & gaining strength slowly (he has good exerc program, still has help at home [facility] per day);  He had right Bell's Palsey 3/12 & went to ER then called [place] & we Rx w/ Valtrex & Pred Dosepak ==> resolved & back to normal now;  Breathing stable on the Pred 5mg  Qod, BP looks good & edema is sl better w/ his incr to 60mg /d> we discussed checking BMet (K=4.6, BUN=22, Creat=1.1) and incr Lasix to 80mg  Qam;  CV stable w/o CP or cerebral ischemic symptoms...  We reviewed his meds & decided to continue others the same for now...  ~  September 01, 2010:  59mo ROV & he remains stable, had SHINGLES  vaccine 6/12 & we gave him the TDAP today; he follows a low sodium diet & will return for FASTING blood work next week... See prob list below>>  ~  January 04, 2011:  86mo ROV & he reports generally stable> cough & congestion improved, using MucinexDM; BP stable on Lasix+KCl; Cardiac stable, denies cerebral ischemic symptoms; labs from 7/12 reviewed; had skin cancer removed by Derm...  ~  February 22, 2011:  6wk ROV & add-on for f/u cellulitis> he saw TP 12/12 w/ acute swelling & redness right foot/ ankle/ leg, area is tender, no broken skin or trauma reported, no drainage, denies fever, no CP/ SOB, etc;  [name] already on Coumadin, followed in the CC & therapeutic, he had IVC filter placed yrs ago; Treated w/ Doxy po & infective symptoms improved> less red/ inflammed etc but still swollen- VI w/ edema up to his thigh on Lasix 40mg - 2Qam...  We decided to incr lasix to 80mg  Bid, 2gm Na+ diet (he's been eating TV dinners, soups, etc), elevation, etc... Plan f/u w/ labs in 21month.    Labs 12/12 showed Chems- ok w/ BUN=19, Creat=1.9, BNP=237;  CBC w/ Hg=15.5, WBC=10.9;  Wound cult- neg, no staph etc;  XRay right foot showed soft tissue swelling, no bone abn seen.Marland KitchenMarland Kitchen  Problem List:  OPHTHALMOLOGY - followed by [facility]... ~  7/12:  He reports that recent check up was OK...  ALLERGIC RHINITIS (ICD-477.9) - on OTC Antihist Prn and FLONASE Qhs...  BRONCHITIS, RECURRENT (ICD-491.9) - on ADVAIR 250Bid, PROVENTIL HFA Prn, & PREDNISONE 5mg  Qod... ~  CXR 7/11 showed some hyperinflation, no change scarring & vol loss RLL, NAD.Marland Kitchen. ~  CXR 11/11 in hosp showed scarring right base w/ incr opac r/o superimposed pneum > he was placed on Pred during this adm & weaned slowly as outpt... ~  CXR 12/11 here showed baseline film w/ basilar scarring & NAD... on Pred 10mg /d & asked to wean to 5mg /d... ~  Continued wean of Pred to 5mg  Qod & pt remains stable w/o change in dyspnea, & w/o resp exac...  Hx of  PULMONARY EMBOLISM (ICD-415.19) - he had a DVT w/ PTE in 2001 w/ IVC filter placed... he remains on COUMADIN w/ regular checks in the Coumadin Clinic- doing well.  HYPERTENSION (ICD-401.9) - on LASIX monotherapy (currently 80mg /d) w/ K20/d... ~  labs 10/10 showed BUN= 17, Creat= 1.0, K= 4.7 ~  11/10: c/o incr dizzy & weak- rec to decr the Lasix to 1/2 tab daily... ~  2/11:  hosp records show norm CBC, BMet, TSH, Urine. ~  7/11: c/o incr edema & wants to incr Lasix to 1-2 Qam; BUN=28, Creat=1.2, K=4.2, BNP=97 ~  Labs 11/11 in hosp on Lasix 40mg /d showed BUN/ Creat= 29/ 1.5 ==> 18/ 0.9 & BNP= 113. ~  4/12:  On Lasix40mg - 1.5 tabsQam & BP= 118/72; notes some fatigue, intermittent dyspnea, dizziness, & edema; but denies HA, visual changes, CP, palipit, syncope, etc; labs show K=4.6, BUN=22, Creat=1.1, rec incr Lasix to 80mg  Qam... ~  7/12:  BP= 124/82 on Lasix 80mg /d... ~  11/12:  BP= 122/76 and he denies CP, palpit, dizzy, ch in SOB or edema...  PAROXYSMAL ATRIAL FIBRILLATION (ICD-427.31) - prev on Sotalol80 but stopped 12/10 by DrNishan...  remains on Coumadin via clinic... he was prev maintaining NSR but has slipped back into AFib w/ controlled VR... ~  Last 2DEcho 2004 showed EF 50-55% w/ infer HK, incr AoV thickness, mild AI, mild LAdil, no thrombus seen... ~  EKG 12/10 showed SBrady w/ RBBB & 1st degree AVB... ~  6/11:  Last seen by DrNishan> he was still regular w/ pulse= 62... ~  Noted to be irreg 12/11 OV & clinical AFib w/ controlled VR...  CEREBROVASCULAR DISEASE (ICD-437.9) - on ASA 81mg /d in addition to the Coumadin...  ~  he is s/p right carotid endarterectomy 7/01 by Windell Moulding...  ~  MRI 8/04 showed intracranial atherosclerotic changes w/ marked ectasia of V-B sys, sm right vertebral w/ prox stenosis, & >50% left ICA stenosis...  ~  CT Brain 1/09 showed atrophy and chr microvacs ischemic changes & remote IC lacune... ~  CDoppler 5/09 showed patent right CAE w/ DPA, mild plaque in  bulb, 0-39% bilat ICA stenoses- no change... ~  CDoppler 5/10 showed patent right CAE w/ DPA, stable mild left carotid dis, 0-39% bilat- f/u 55yr.  PERIPHERAL VASCULAR DISEASE (ICD-443.9) - s/p AAA repair 8/00 & bilat Ao to iliac bypasses in 2001 by DrLawson> he had mult post-op complications and a long hosp course... ~  2011:  f/u vasc eval by Walker Kehr & referral to DrEarly for review> diffuse aneurysmal changes w/ 5cm left Pop art aneurysm- he was felt to be too high risk for surg & they rec BP control & observation only.Marland KitchenMarland Kitchen  VENOUS INSUFFICIENCY, CHRONIC (ICD-459.81) - chr ven insuffic due to his mult DVT episodes & prev IVC filter ?2001... he's had incr trouble w/ LE edema recently & requires incr Lasix dosing... ~  Venous Dopplers right leg 8/09 showed no evid for DVT, no superfic clots, etc... ~  8/09 developed cellulitis right leg which resolved to Keflex & local care... ~  [facility] 2/11 by [facility] for left hand cellulitis... ~  [facility] 11/11 by [facility] for right arm cellulitis... ~  2/12:  exam w/ persist incr LE edema w/ pitting> rec to incr LASIX 40==>80mg /d. ~  12/12: episode of LLE cellulitis w/ incr edema, therapeutic INR, treated w/ Doxy & incr Lasix...  HYPERLIPIDEMIA (ICD-272.4) - on LIPITOR 20mg /d & prev FLP at goal on this dose... ~  FLP 5/08 showed TChol 124, Tg 99, HDL 40, LDL 65 ~  FLP 5/09 showed TChol 120, TG 93, HDL 36, LDL 66 ~  FLP 10/10 showed TChol 123, TG 96, HDL 47, LDL 57 ~  FLP 7/12 showed TChol 105, TG 69, HDL 55, LDL 36  GERD (ICD-530.81) - on PEPCID 20mg /d...  DIVERTICULOSIS OF COLON (ICD-562.10) IRRITABLE BOWEL SYNDROME (ICD-564.1) - he has diarrhea (uses Immodium) & constipation (uses Miralax/ Senakot-S) w/ a tough job regulating (uses ALIGN)... COLONIC POLYPS (ICD-211.3) - last colonoscopy 8/99 showed divertics, no recurrent polyps... last polyps removed 1996= adenomatous... also had incidental cecal lipoma seen.  Hx of PROSTATE CANCER (ICD-185) - s/p radical  prostatectomy in 1994... he is followed by DrWrenn & has urinary incontinence for which he uses pads etc... ~  labs 10/10 showed PSA= 0.03 ~  labs 7/11 showed PSA= 0.04 ~  Labs 7/12 showed PSA= 0.08  DEGENERATIVE JOINT DISEASE (ICD-715.90) - he has a known lumbar disc disease and cervical spondylosis... he also takes Vit D 1000 u daily...  ~  labs 7/11 showed Vit D level = 30... continue OTC supplement.  Hx of STROKE (ICD-434.91) - he was hospitalized 8/04 w/ stroke and MRI showed acute left pontine infarct + diffuse intracranial atherosclerotic changes- see above- on ASA & Coumadin.  PERIPHERAL NEUROPATHY (ICD-356.9) - he has LBP, neuropathy and a gait abnormality...  ANXIETY DEPRESSION (ICD-300.4) - wife, Kathie Rhodes, died 07/09/2022 on hospice (severe pulm fibrosis from scleroderma/ CREST/ etc)...   Past Surgical History  Procedure Date  . Prostateectomy   . Cataract extraction   . Inguinal hernia repair   . Carotid endarterectomy     right  . Abdominal aortic aneurysm repair   . Skin cancer excision     Outpatient Encounter Prescriptions as of 02/22/2011  Medication Sig Dispense Refill  . acetaminophen (TYLENOL) 650 MG CR tablet Take 650 mg by mouth 2 (two) times daily.      Marland Kitchen albuterol (PROAIR HFA) 108 (90 BASE) MCG/ACT inhaler Inhale 1-2 puffs into the lungs every 6 (six) hours as needed.  1 Inhaler  11  . aspirin 81 MG tablet Take 81 mg by mouth daily.        . calcium carbonate (TUMS - DOSED IN MG ELEMENTAL CALCIUM) 500 MG chewable tablet As needed       . cholecalciferol (VITAMIN D) 1000 UNITS tablet Take 1,000 Units by mouth daily.        . famotidine (PEPCID) 10 MG tablet Take 10 mg by mouth at bedtime.        . fluticasone (FLONASE) 50 MCG/ACT nasal spray instill 2 sprays into each nostril once daily  16 g  11  . Fluticasone-Salmeterol (  ADVAIR DISKUS) 250-50 MCG/DOSE AEPB Inhale 1 puff into the lungs 2 (two) times daily. Rinse mouth well  60 each  11  . predniSONE (DELTASONE) 10 MG  tablet Take 1/2 tablet by mouth every other day      . PROBIOTIC CAPS Take 1 cap daily       . sennosides-docusate sodium (SENOKOT-S) 8.6-50 MG tablet Use as needed for constipation       . warfarin (COUMADIN) 4 MG tablet TAKE AS DIRECTED BY COUMADIN CLINIC  100 tablet  2  . DISCONTD: atorvastatin (LIPITOR) 20 MG tablet take 1 tablet by mouth once daily  30 tablet  5  . DISCONTD: furosemide (LASIX) 40 MG tablet Take 2 tablets by mouth two times daily      . DISCONTD: furosemide (LASIX) 40 MG tablet Take 2 tablets by mouth two times daily  120 tablet  5  . DISCONTD: KLOR-CON M20 20 MEQ tablet take 1 tablet by mouth once daily  30 tablet  4  . dextromethorphan-guaiFENesin (MUCINEX DM) 30-600 MG per 12 hr tablet Take 1 tablet by mouth every 12 (twelve) hours.        Marland Kitchen oxyCODONE (OXY IR/ROXICODONE) 5 MG immediate release tablet Take 1 tab every 4-6 hours as needed pain as needed only per pt         Allergies  Allergen Reactions  . Amoxicillin     REACTION: rash    Current Medications, Allergies, Past Medical History, Past Surgical History, Family History, and Social History were reviewed in [employer] record.   Review of Systems         See HPI - all other systems neg except as noted... The patient complains of decreased hearing, dyspnea on exertion, peripheral edema, muscle weakness, difficulty walking, and depression.  The patient denies anorexia, fever, weight loss, weight gain, vision loss, hoarseness, chest pain, syncope, prolonged cough, headaches, hemoptysis, abdominal pain, melena, hematochezia, severe indigestion/heartburn, hematuria, incontinence, suspicious skin lesions, transient blindness, unusual weight change, abnormal bleeding, enlarged lymph nodes, and angioedema.     Objective:   Physical Exam     WD, sl thin, 78 y/o WM in NAD... GENERAL:  Alert & oriented; pleasant & cooperative... HEENT:  [place]/AT, EOM-wnl, PERRLA, EACs-clear, TMs-wnl, NOSE-clear,  THROAT-clear & wnl. NECK:  Supple w/ fairROM; no JVD; s/p right CAE, no bruits; no thyromegaly or nodules palpated; no lymphadenopathy. CHEST: decr BS bilat- few rhonchi in RLL area, no consolidation, no wheezing... HEART:  sl irregular rhythm; w/o murmur, rubs, or gallop heard... ABDOMEN:  Soft & nontender; normal bowel sounds; no organomegaly or masses detected. EXT: without deformities, mod arthritic changes; ambulates w/ walker; +venous stasis, 2+edema noted. NEURO:  CN's intact;  gait abn; no focal neuro deficits,  +generalized weakness... DERM:  No lesions noted; no rash etc...  RADIOLOGY DATA:  Reviewed in the EPIC EMR & discussed w/ the patient...    >>XRay right foot 12/12 showed soft tissue swelling, no bone abn seen...  LABORATORY DATA:  Reviewed in the EPIC EMR & discussed w/ the patient...    >>Labs 12/12:  Chems- ok w/ BUN=19, Creat=1.9, BNP=237;  CBC w/ Hg=15.5, WBC=10.9;  Wound cult- neg, no staph etc...   Assessment & Plan:   HBP>  Remains under good control w/ his diet + Lasix monotherapy;  Due to his increased edema & ok labs==> increase Lasix to 80mg  Bid....  AFib>  Followed by Walker Kehr for cards, on Coumadin via clinic w/ therapeutic INR, controlled  VR...  VASC DISEASE>  He has Cerebrovasc dis & Periph vasc dis followed by Walker Kehr & [facility]- DrEarly, Windell Moulding;  No ischemic symptoms, remains stable...  Venous Insuffic w/ Edema>  With increased edema left leg & labs ok- we will increase Lasix to 80mg  Bid as noted...  Hx DVT w/ PTE in 2001 w/ IVC filter placed & on Coumadin followed in the CC ever since...  Hyperlipidemia>  Controlled on Lip20 + diet efforts...  GI> GERD, Divertics, IBS, Polyps>  Stable on Pepcid, Miralax, Align...  GU> Hx prostate cancer, urinary incont>  Followed by DrWrenn...  Hx Stroke>  This occurred in 2004, on ASA & Coumadin...  Hx of Bell's Palsey>  S/p Right Bell's Palsey 3/12, treated w/ Valtrex & Dosepak & resolved back to  baseline...  Other medical problems as noted...     Patient's Medications  New Prescriptions   No medications on file  Previous Medications   ACETAMINOPHEN (TYLENOL) 650 MG CR TABLET    Take 650 mg by mouth 2 (two) times daily.   ALBUTEROL (PROAIR HFA) 108 (90 BASE) MCG/ACT INHALER    Inhale 1-2 puffs into the lungs every 6 (six) hours as needed.   ASPIRIN 81 MG TABLET    Take 81 mg by mouth daily.     CALCIUM CARBONATE (TUMS - DOSED IN MG ELEMENTAL CALCIUM) 500 MG CHEWABLE TABLET    As needed    CHOLECALCIFEROL (VITAMIN D) 1000 UNITS TABLET    Take 1,000 Units by mouth daily.     DEXTROMETHORPHAN-GUAIFENESIN (MUCINEX DM) 30-600 MG PER 12 HR TABLET    Take 1 tablet by mouth every 12 (twelve) hours.     FAMOTIDINE (PEPCID) 10 MG TABLET    Take 10 mg by mouth at bedtime.     FLUTICASONE (FLONASE) 50 MCG/ACT NASAL SPRAY    instill 2 sprays into each nostril once daily   FLUTICASONE-SALMETEROL (ADVAIR DISKUS) 250-50 MCG/DOSE AEPB    Inhale 1 puff into the lungs 2 (two) times daily. Rinse mouth well   OXYCODONE (OXY IR/ROXICODONE) 5 MG IMMEDIATE RELEASE TABLET    Take 1 tab every 4-6 hours as needed pain as needed only per pt    PREDNISONE (DELTASONE) 10 MG TABLET    Take 1/2 tablet by mouth every other day   PROBIOTIC CAPS    Take 1 cap daily    SENNOSIDES-DOCUSATE SODIUM (SENOKOT-S) 8.6-50 MG TABLET    Use as needed for constipation    WARFARIN (COUMADIN) 4 MG TABLET    TAKE AS DIRECTED BY COUMADIN CLINIC  Modified Medications   Modified Medication Previous Medication   ATORVASTATIN (LIPITOR) 20 MG TABLET atorvastatin (LIPITOR) 20 MG tablet      take 1 tablet by mouth once daily    take 1 tablet by mouth once daily   FUROSEMIDE (LASIX) 40 MG TABLET furosemide (LASIX) 40 MG tablet      Take 2 tablets by mouth in the morning and 1 tablet by mouth in the evening.    Take 2 tablets by mouth two times daily   POTASSIUM CHLORIDE SA (KLOR-CON M20) 20 MEQ TABLET KLOR-CON M20 20 MEQ tablet       Take 1 tablet (20 mEq total) by mouth 2 (two) times daily.    take 1 tablet by mouth once daily  Discontinued Medications   KLOR-CON M20 20 MEQ TABLET    take 1 tablet by mouth once daily

## 2011-03-27 NOTE — Progress Notes (Signed)
Subjective:    Patient ID: Glenn Barron, male    DOB: 1922-12-09, 78 y.o.   MRN: [id number]  HPI 78 y/o WM here for a follow up visit... he has mult med problems as noted below... Followed for general medical purposes w/ hx of recurrent bronchitis & allergies; hx PTE/ DVT/ & IVC filter on Coumadin; HBP; AFib; cerebrovasc dis w/ hx stroke & prev right CAE; ASPVD w/ prev AAA repair & subseq bilat Ao-iliac bypasses; Hyperchol; Prostate cancer; neuropathy; and anxiety/ depression...  ~  March 22, 2010:   6 wk ROV- still weak but improving slowly> He has mult somatic complaints but overall appears stable;  he is in AFib clinically w/ controlled VR & HR=90, continues on Coumadin via CC;  he has persist LE edema w/ pitting & wt up 3#> on Lasix 40mg /d, states he's restricting sodium (but eating TV dinners), elevating legs, wearing support hose;  BP well controlled at 116/80 & we discussed 2gm sodium restriction & incr Lasix 40-80mg  daily...    Breathing is stable & he's been on Pred 10mg - 1/2 daily;  we discussed weaning to 5mg  Qod, continue Advair, etc...    He has a prob w/ his right hand> can't dorsiflex the 3rd-4th fingers & has appt w/ DrWeingold to eval...    He notes some constipation & we have rec Miralax daily if necessary for regulation, along w/ his Senakot-S, Align, etc...  ~  June 01, 2010:  51mo ROV- generally stable & gaining strength slowly (he has good exerc program, still has help at home [facility] per day);  He had right Bell's Palsey 3/12 & went to ER then called [place] & we Rx w/ Valtrex & Pred Dosepak ==> resolved & back to normal now;  Breathing stable on the Pred 5mg  Qod, BP looks good & edema is sl better w/ his incr to 60mg /d> we discussed checking BMet (K=4.6, BUN=22, Creat=1.1) and incr Lasix to 80mg  Qam;  CV stable w/o CP or cerebral ischemic symptoms...  We reviewed his meds & decided to continue others the same for now...  ~  September 01, 2010:  51mo ROV & he remains stable, had SHINGLES  vaccine 6/12 & we gave him the TDAP today; he follows a low sodium diet & will return for FASTING blood work next week... See prob list below>>  ~  January 04, 2011:  93mo ROV & he reports generally stable> cough & congestion improved, using MucinexDM; BP stable on Lasix+KCl; Cardiac stable, denies cerebral ischemic symptoms; labs from 7/12 reviewed; had skin cancer removed by Derm...  ~  February 22, 2011:  6wk ROV & add-on for f/u cellulitis> he saw TP 12/12 w/ acute swelling & redness right foot/ ankle/ leg, area is tender, no broken skin or trauma reported, no drainage, denies fever, no CP/ SOB, etc;  [name] already on Coumadin, followed in the CC & therapeutic, he had IVC filter placed yrs ago; Treated w/ Doxy po & infective symptoms improved> less red/ inflammed etc but still swollen- VI w/ edema up to his thigh on Lasix 40mg - 2Qam...  We decided to incr lasix to 80mg  Bid, 2gm Na+ diet (he's been eating TV dinners, soups, etc), elevation, etc... Plan f/u w/ labs in 76month.    Labs 12/12 showed Chems- ok w/ BUN=19, Creat=1.9, BNP=237;  CBC w/ Hg=15.5, WBC=10.9;  Wound cult- neg, no staph etc;  XRay right foot showed soft tissue swelling, no bone abn seen...  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     Labs today showed:  Chems- ok w/ BUN=32, Creat=1.1, K=5.0, BNP=159;  CBC- ok w/ Hg=16.1, WBC=9.6.Marland KitchenMarland Kitchen We decided to decr the Lasix40 slightly to 2Qam & 1Qpm...  Plan ROV 6weeks.         Problem List:  OPHTHALMOLOGY - followed by [facility]... ~  7/12:  He reports that recent check up was OK...  ALLERGIC RHINITIS  (ICD-477.9) - on OTC Antihist Prn and FLONASE Qhs...  BRONCHITIS, RECURRENT (ICD-491.9) - on ADVAIR 250Bid, PROVENTIL HFA Prn, & PREDNISONE 5mg  Qod... ~  CXR 7/11 showed some hyperinflation, no change scarring & vol loss RLL, NAD.Marland Kitchen. ~  CXR 11/11 in hosp showed scarring right base w/ incr opac r/o superimposed pneum > he was placed on Pred during this adm & weaned slowly as outpt... ~  CXR 12/11 here showed baseline film w/ basilar scarring & NAD... on Pred 10mg /d & asked to wean to 5mg /d... ~  Continued wean of Pred to 5mg  Qod & pt remains stable w/o change in dyspnea, & w/o resp exac...  Hx of PULMONARY EMBOLISM (ICD-415.19) - he had a DVT w/ PTE in 2001 w/ IVC filter placed... he remains on COUMADIN w/ regular checks in the Coumadin Clinic- doing well.  HYPERTENSION (ICD-401.9) - on LASIX monotherapy (currently 80mg /d) w/ K20/d... ~  labs 10/10 showed BUN= 17, Creat= 1.0, K= 4.7 ~  11/10: c/o incr dizzy & weak- rec to decr the Lasix to 1/2 tab daily... ~  2/11:  hosp records show norm CBC, BMet, TSH, Urine. ~  7/11: c/o incr edema & wants to incr Lasix to 1-2 Qam; BUN=28, Creat=1.2, K=4.2, BNP=97 ~  Labs 11/11 in hosp on Lasix 40mg /d showed BUN/ Creat= 29/ 1.5 ==> 18/ 0.9 & BNP= 113. ~  4/12:  On Lasix40mg - 1.5 tabsQam & BP= 118/72; notes some fatigue, intermittent dyspnea, dizziness, & edema; but denies HA, visual changes, CP, palipit, syncope, etc; labs show K=4.6, BUN=22, Creat=1.1, rec incr Lasix to 80mg  Qam... ~  7/12:  BP= 124/82 on Lasix 80mg /d... ~  11/12:  BP= 122/76 and he denies CP, palpit, dizzy, ch in SOB or edema... ~  2/13:  BP= 110/62 on Lasix 80Bid; denies CP, palpit, dizzy, ch in SOB/DOE, note decr in edema & wt.  PAROXYSMAL ATRIAL FIBRILLATION (ICD-427.31) - prev on Sotalol80 but stopped 12/10 by DrNishan...  remains on Coumadin via clinic... he was prev maintaining NSR but has slipped back into AFib w/ controlled VR... ~  Last 2DEcho 2004 showed EF 50-55% w/ infer HK, incr  AoV thickness, mild AI, mild LAdil, no thrombus seen... ~  EKG 12/10 showed SBrady w/ RBBB & 1st degree AVB... ~  6/11:  Last seen by DrNishan> he was still regular w/ pulse= 62... ~  Noted to be irreg 12/11 OV & clinical AFib w/ controlled VR...  CEREBROVASCULAR DISEASE (ICD-437.9) - on ASA 81mg /d in addition to the Coumadin...  ~  he is s/p right carotid endarterectomy 7/01 by Windell Moulding...  ~  MRI 8/04 showed intracranial atherosclerotic changes w/ marked ectasia  of V-B sys, sm right vertebral w/ prox stenosis, & >50% left ICA stenosis...  ~  CT Brain 1/09 showed atrophy and chr microvacs ischemic changes & remote IC lacune... ~  CDoppler 5/09 showed patent right CAE w/ DPA, mild plaque in bulb, 0-39% bilat ICA stenoses- no change... ~  CDoppler 5/10 showed patent right CAE w/ DPA, stable mild left carotid dis, 0-39% bilat- f/u 8yr.  PERIPHERAL VASCULAR DISEASE (ICD-443.9) - s/p AAA repair 8/00 & bilat Ao to iliac bypasses in 2001 by DrLawson> he had mult post-op complications and a long hosp course... 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  ~  2/13:  With loss of edema & decr 10# on Lasix80Bid> there is an indurated swollen area in the post aspect of left lower thigh above the pop fossa> pt referred to DrEarly to recheck pop art aneurysm...  VENOUS INSUFFICIENCY, CHRONIC (ICD-459.81) - chr ven insuffic due to his mult DVT episodes & prev IVC filter ?2001... he's had incr trouble w/ LE edema recently & requires incr Lasix dosing... ~  Venous Dopplers right leg 8/09 showed no evid for DVT, no superfic clots, etc... ~  8/09 developed cellulitis right leg which resolved to Keflex & local care... ~  [facility] 2/11 by [facility] for left hand cellulitis... ~  [facility] 11/11 by [facility] for right arm cellulitis... ~  2/12:  exam w/ persist incr LE edema  w/ pitting> rec to incr LASIX 40==>80mg /d. ~  12/12: episode of LLE cellulitis w/ incr edema, therapeutic INR, treated w/ Doxy & incr Lasix... ~  1-2/13: cellulitis resolved, incr edema rx w/ incr Lasix & he lost 10# on Lasix80Bid...  HYPERLIPIDEMIA (ICD-272.4) - on LIPITOR 20mg /d & prev FLP at goal on this dose... ~  FLP 5/08 showed TChol 124, Tg 99, HDL 40, LDL 65 ~  FLP 5/09 showed TChol 120, TG 93, HDL 36, LDL 66 ~  FLP 10/10 showed TChol 123, TG 96, HDL 47, LDL 57 ~  FLP 7/12 showed TChol 105, TG 69, HDL 55, LDL 36  GERD (ICD-530.81) - on PEPCID 20mg /d...  DIVERTICULOSIS OF COLON (ICD-562.10) IRRITABLE BOWEL SYNDROME (ICD-564.1) - he has diarrhea (uses Immodium) & constipation (uses Miralax/ Senakot-S) w/ a tough job regulating (uses ALIGN)... COLONIC POLYPS (ICD-211.3) - last colonoscopy 8/99 showed divertics, no recurrent polyps... last polyps removed 1996= adenomatous... also had incidental cecal lipoma seen.  Hx of PROSTATE CANCER (ICD-185) - s/p radical prostatectomy in 1994... he is followed by DrWrenn & has urinary incontinence for which he uses pads etc... ~  labs 10/10 showed PSA= 0.03 ~  labs 7/11 showed PSA= 0.04 ~  Labs 7/12 showed PSA= 0.08  DEGENERATIVE JOINT DISEASE (ICD-715.90) - he has a known lumbar disc disease and cervical spondylosis... he also takes Vit D 1000 u daily...  ~  labs 7/11 showed Vit D level = 30... continue OTC supplement.  Hx of STROKE (ICD-434.91) - he was hospitalized 8/04 w/ stroke and MRI showed acute left pontine infarct + diffuse intracranial atherosclerotic changes- see above- on ASA & Coumadin.  PERIPHERAL NEUROPATHY (ICD-356.9) - he has LBP, neuropathy and a gait abnormality...  ANXIETY DEPRESSION (ICD-300.4) - wife, Kathie Rhodes, died Jun 30, 2022 on hospice (severe pulm fibrosis from scleroderma/ CREST/ etc)...   Past Surgical History  Procedure Date  .  Prostateectomy   . Cataract extraction   . Inguinal hernia repair   . Carotid endarterectomy      right  . Abdominal aortic aneurysm repair   . Skin cancer excision     Outpatient Encounter Prescriptions as of 03/24/2011  Medication Sig Dispense Refill  . acetaminophen (TYLENOL) 650 MG CR tablet Take 650 mg by mouth 2 (two) times daily.      Marland Kitchen albuterol (PROAIR HFA) 108 (90 BASE) MCG/ACT inhaler Inhale 1-2 puffs into the lungs every 6 (six) hours as needed.  1 Inhaler  11  . aspirin 81 MG tablet Take 81 mg by mouth daily.        Marland Kitchen atorvastatin (LIPITOR) 20 MG tablet take 1 tablet by mouth once daily  30 tablet  5  . calcium carbonate (TUMS - DOSED IN MG ELEMENTAL CALCIUM) 500 MG chewable tablet As needed       . cholecalciferol (VITAMIN D) 1000 UNITS tablet Take 1,000 Units by mouth daily.        Marland Kitchen dextromethorphan-guaiFENesin (MUCINEX DM) 30-600 MG per 12 hr tablet Take 1 tablet by mouth every 12 (twelve) hours.        . famotidine (PEPCID) 10 MG tablet Take 10 mg by mouth at bedtime.        . fluticasone (FLONASE) 50 MCG/ACT nasal spray instill 2 sprays into each nostril once daily  16 g  11  . Fluticasone-Salmeterol (ADVAIR DISKUS) 250-50 MCG/DOSE AEPB Inhale 1 puff into the lungs 2 (two) times daily. Rinse mouth well  60 each  11  . oxyCODONE (OXY IR/ROXICODONE) 5 MG immediate release tablet Take 1 tab every 4-6 hours as needed pain as needed only per pt       . potassium chloride SA (KLOR-CON M20) 20 MEQ tablet Take 1 tablet (20 mEq total) by mouth 2 (two) times daily.  60 tablet  5  . predniSONE (DELTASONE) 10 MG tablet Take 1/2 tablet by mouth every other day      . PROBIOTIC CAPS Take 1 cap daily       . sennosides-docusate sodium (SENOKOT-S) 8.6-50 MG tablet Use as needed for constipation       . warfarin (COUMADIN) 4 MG tablet TAKE AS DIRECTED BY COUMADIN CLINIC  100 tablet  2  . DISCONTD: furosemide (LASIX) 40 MG tablet Take 2 tablets by mouth two times daily  120 tablet  5    Allergies  Allergen Reactions  . Amoxicillin     REACTION: rash    Current Medications,  Allergies, Past Medical History, Past Surgical History, Family History, and Social History were reviewed in [employer] record.   Review of Systems         See HPI - all other systems neg except as noted... The patient complains of decreased hearing, dyspnea on exertion, peripheral edema, muscle weakness, difficulty walking, and depression.  The patient denies anorexia, fever, weight loss, weight gain, vision loss, hoarseness, chest pain, syncope, prolonged cough, headaches, hemoptysis, abdominal pain, melena, hematochezia, severe indigestion/heartburn, hematuria, incontinence, suspicious skin lesions, transient blindness, unusual weight change, abnormal bleeding, enlarged lymph nodes, and angioedema.     Objective:   Physical Exam     WD, sl thin, 78 y/o WM in NAD... GENERAL:  Alert & oriented; pleasant & cooperative... HEENT:  [place]/AT, EOM-wnl, PERRLA, EACs-clear, TMs-wnl, NOSE-clear, THROAT-clear & wnl. NECK:  Supple w/ fairROM; no JVD; s/p right CAE, no bruits; no thyromegaly or nodules palpated; no lymphadenopathy. CHEST:  decr BS bilat- few rhonchi in RLL area, no consolidation, no wheezing... HEART:  sl irregular rhythm; w/o murmur, rubs, or gallop heard... ABDOMEN:  Soft & nontender; normal bowel sounds; no organomegaly or masses detected. EXT: without deformities, mod arthritic changes; ambulates w/ walker; +venous stasis, 2+edema noted. NEURO:  CN's intact;  gait abn; no focal neuro deficits,  +generalized weakness... DERM:  No lesions noted; no rash etc...  RADIOLOGY DATA:  Reviewed in the EPIC EMR & discussed w/ the patient...    >>XRay right foot 12/12 showed soft tissue swelling, no bone abn seen...  LABORATORY DATA:  Reviewed in the EPIC EMR & discussed w/ the patient...    >>Labs 12/12:  Chems- ok w/ BUN=19, Creat=1.0, BNP=237;  CBC w/ Hg=15.5, WBC=10.9;  Wound cult- neg, no staph etc...    >>Labs 2/13 showed:  Chems- ok w/ BUN=32, Creat=1.1, K=5.0,  BNP=159;  CBC- ok w/ Hg=16.1, WBC=9.6.Marland KitchenMarland Kitchen We decided to decr the Lasix40 slightly to 2Qam & 1Qpm...  Plan ROV 6weeks.   Assessment & Plan:   HBP>  Remains under good control w/ his diet + Lasix monotherapy;  Last OV lasix incr to 80Bid & today we will decr slightly to 80mg AM & 40mg PM...  AFib>  Followed by Walker Kehr for cards, on Coumadin via clinic w/ therapeutic INR, controlled VR...  VASC DISEASE>  He has Cerebrovasc dis & Periph vasc dis followed by Walker Kehr & [facility]- DrEarly, Windell Moulding;  With wt loss & decr edema in LLE he now has palp mass/ indurated area behind the left lower thigh above the pop fossa & this needs recheck by DrEarly> hx pop art aneurysm that measured 5cm when last checked & was deemed inoperable...  Venous Insuffic w/ Edema>  Improved after Lasix 80mg  Bid as noted; Labs reviewed & we decided to decr to 80mg Qam & 40mg Qpm...  Hx DVT w/ PTE in 2001 w/ IVC filter placed & on Coumadin followed in the CC ever since...  Hyperlipidemia>  Controlled on Lip20 + diet efforts...  GI> GERD, Divertics, IBS, Polyps>  Stable on Pepcid, Miralax, Align...  GU> Hx prostate cancer, urinary incont>  Followed by DrWrenn...  Hx Stroke>  This occurred in 2004, on ASA & Coumadin...  Hx of Bell's Palsey>  S/p Right Bell's Palsey 3/12, treated w/ Valtrex & Dosepak & resolved back to baseline...  Other medical problems as noted...     Patient's Medications  New Prescriptions   No medications on file  Previous Medications   ACETAMINOPHEN (TYLENOL) 650 MG CR TABLET    Take 650 mg by mouth 2 (two) times daily.   ALBUTEROL (PROAIR HFA) 108 (90 BASE) MCG/ACT INHALER    Inhale 1-2 puffs into the lungs every 6 (six) hours as needed.   ASPIRIN 81 MG TABLET    Take 81 mg by mouth daily.     ATORVASTATIN (LIPITOR) 20 MG TABLET    take 1 tablet by mouth once daily   CALCIUM CARBONATE (TUMS - DOSED IN MG ELEMENTAL CALCIUM) 500 MG CHEWABLE TABLET    As needed    CHOLECALCIFEROL (VITAMIN D) 1000 UNITS  TABLET    Take 1,000 Units by mouth daily.     DEXTROMETHORPHAN-GUAIFENESIN (MUCINEX DM) 30-600 MG PER 12 HR TABLET    Take 1 tablet by mouth every 12 (twelve) hours.     FAMOTIDINE (PEPCID) 10 MG TABLET    Take 10 mg by mouth at bedtime.     FLUTICASONE (FLONASE) 50 MCG/ACT NASAL SPRAY    instill  2 sprays into each nostril once daily   FLUTICASONE-SALMETEROL (ADVAIR DISKUS) 250-50 MCG/DOSE AEPB    Inhale 1 puff into the lungs 2 (two) times daily. Rinse mouth well   OXYCODONE (OXY IR/ROXICODONE) 5 MG IMMEDIATE RELEASE TABLET    Take 1 tab every 4-6 hours as needed pain as needed only per pt    POTASSIUM CHLORIDE SA (KLOR-CON M20) 20 MEQ TABLET    Take 1 tablet (20 mEq total) by mouth 2 (two) times daily.   PREDNISONE (DELTASONE) 10 MG TABLET    Take 1/2 tablet by mouth every other day   PROBIOTIC CAPS    Take 1 cap daily    SENNOSIDES-DOCUSATE SODIUM (SENOKOT-S) 8.6-50 MG TABLET    Use as needed for constipation    WARFARIN (COUMADIN) 4 MG TABLET    TAKE AS DIRECTED BY COUMADIN CLINIC  Modified Medications   Modified Medication Previous Medication   FUROSEMIDE (LASIX) 40 MG TABLET furosemide (LASIX) 40 MG tablet      Take 2 tablets by mouth in the morning and 1 tablet by mouth in the evening.    Take 2 tablets by mouth two times daily  Discontinued Medications   No medications on file

## 2011-03-28 ENCOUNTER — Encounter: Payer: Self-pay | Admitting: Vascular Surgery

## 2011-03-29 ENCOUNTER — Encounter: Payer: Self-pay | Admitting: Vascular Surgery

## 2011-03-29 ENCOUNTER — Ambulatory Visit (INDEPENDENT_AMBULATORY_CARE_PROVIDER_SITE_OTHER): Payer: Medicare Other | Admitting: Vascular Surgery

## 2011-03-29 ENCOUNTER — Other Ambulatory Visit: Payer: Self-pay | Admitting: Pulmonary Disease

## 2011-03-29 VITALS — BP 134/87 | HR 76 | Resp 16 | Ht 72.0 in | Wt 161.0 lb

## 2011-03-29 DIAGNOSIS — I724 Aneurysm of artery of lower extremity: Secondary | ICD-10-CM

## 2011-03-29 NOTE — Progress Notes (Signed)
[facility] [place]   Patient name: Glenn Barron MRN: [id number] DOB: 12/08/1922 Sex: male   Referred by: Kriste Basque  Reason for referral:  Chief Complaint  Patient presents with  . Aneurysm    follow up on left leg swelling behind the knee.   Dr. Kriste Basque sent him back to see [place].     HISTORY OF PRESENT ILLNESS: The patient seen today for evaluation of left thigh swelling. I had seen the patient 2 years ago in August of 2011 he does have a prior history of abdominal aortic aneurysm repair by Dr. Hart Rochester in 08-02-1998. This is had a right carotid endarterectomy. He undergone a CT angiogram with runoff and my last visit. This revealed ectasia of his common iliac arteries bilaterally and large bilateral popliteal artery aneurysms. He has significant morbidities and was felt best to continue observation despite the size of these do to the overall morbidity of multiple surgical procedures for repair. He has maintained his frail but stable health. He is becoming more sedentary with more difficulty in walking. He has recently had several episodes of bilateral leg swelling and also several episodes of cellulitis. This swelling has responded with more aggressive diuretic treatment. He has noted more swelling in his left thigh and I am seeing him for further evaluation of this.  Past Medical History  Diagnosis Date  . Allergic rhinitis, cause unspecified   . Unspecified chronic bronchitis   . Other pulmonary embolism and infarction   . Hypertension   . Paroxysmal a-fib   . Cerebrovascular disease, unspecified   . Peripheral vascular disease, unspecified   . Unspecified venous (peripheral) insufficiency   . Acute venous embolism and thrombosis of unspecified deep vessels of lower extremity   . Hyperlipidemia   . GERD (gastroesophageal reflux disease)   . Diverticulosis of colon (without mention of hemorrhage)   . Irritable bowel syndrome   . Benign neoplasm of colon   . Malignant  neoplasm of prostate   . Osteoarthrosis, unspecified whether generalized or localized, unspecified site   . Unspecified cerebral artery occlusion with cerebral infarction   . Unspecified hereditary and idiopathic peripheral neuropathy   . Depression     Past Surgical History  Procedure Date  . Prostateectomy   . Cataract extraction   . Inguinal hernia repair   . Carotid endarterectomy     right  . Abdominal aortic aneurysm repair   . Skin cancer excision     History   Social History  . Marital Status: Widowed    Spouse Name: N/A    Number of Children: 3  . Years of Education: N/A   Occupational History  .     Social History Main Topics  . Smoking status: Never Smoker   . Smokeless tobacco: Never Used  . Alcohol Use: No  . Drug Use: No  . Sexually Active: Not on file   Other Topics Concern  . 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  Allergies as of 03/29/2011 - Review Complete 03/29/2011  Allergen Reaction Noted  . Amoxicillin    . Doxycycline  03/29/2011    Current Outpatient Prescriptions on File Prior to Visit  Medication Sig Dispense Refill  . acetaminophen (TYLENOL) 650 MG CR tablet Take 650 mg by mouth 2 (two) times daily.      Marland Kitchen albuterol (PROAIR  HFA) 108 (90 BASE) MCG/ACT inhaler Inhale 1-2 puffs into the lungs every 6 (six) hours as needed.  1 Inhaler  11  . aspirin 81 MG tablet Take 81 mg by mouth daily.        Marland Kitchen atorvastatin (LIPITOR) 20 MG tablet take 1 tablet by mouth once daily  30 tablet  5  . calcium carbonate (TUMS - DOSED IN MG ELEMENTAL CALCIUM) 500 MG chewable tablet As needed       . cholecalciferol (VITAMIN D) 1000 UNITS tablet Take 1,000 Units by mouth daily.        . famotidine (PEPCID) 10 MG tablet Take 10 mg by mouth at bedtime.        . fluticasone (FLONASE) 50 MCG/ACT nasal spray instill 2 sprays into  each nostril once daily  16 g  11  . Fluticasone-Salmeterol (ADVAIR DISKUS) 250-50 MCG/DOSE AEPB Inhale 1 puff into the lungs 2 (two) times daily. Rinse mouth well  60 each  11  . furosemide (LASIX) 40 MG tablet Take 2 tablets by mouth in the morning and 1 tablet by mouth in the evening.  120 tablet  5  . oxyCODONE (OXY IR/ROXICODONE) 5 MG immediate release tablet Take 1 tab every 4-6 hours as needed pain as needed only per pt       . potassium chloride SA (KLOR-CON M20) 20 MEQ tablet Take 1 tablet (20 mEq total) by mouth 2 (two) times daily.  60 tablet  5  . predniSONE (DELTASONE) 10 MG tablet Take 1/2 tablet by mouth every other day      . PROBIOTIC CAPS Take 1 cap daily       . sennosides-docusate sodium (SENOKOT-S) 8.6-50 MG tablet Use as needed for constipation       . warfarin (COUMADIN) 4 MG tablet TAKE AS DIRECTED BY COUMADIN CLINIC  100 tablet  2  . dextromethorphan-guaiFENesin (MUCINEX DM) 30-600 MG per 12 hr tablet Take 1 tablet by mouth every 12 (twelve) hours.           REVIEW OF SYSTEMS:  Positives indicated with an "X"  CARDIOVASCULAR:  [ ]  chest pain   [ ]  chest pressure   [ ]  palpitations   [ ]  orthopnea   [ ]  dyspnea on exertion   [ ]  claudication   [ ]  rest pain   [x ] DVT   [x ] phlebitis PULMONARY:   [ ]  productive cough   [ ]  asthma   [ ]  wheezing NEUROLOGIC:   [ ]  weakness  [ ]  paresthesias  [ ]  aphasia  [ ]  amaurosis  [ ]  dizziness HEMATOLOGIC:   [ ]  bleeding problems   [ ]  clotting disorders MUSCULOSKELETAL:  [ ]  joint pain   [ ]  joint swelling GASTROINTESTINAL: [ ]   blood in stool  [ ]   hematemesis GENITOURINARY:  [ ]   dysuria  [ ]   hematuria PSYCHIATRIC:  [ ]  history of major depression INTEGUMENTARY:  [ ]  rashes  [ ]  ulcers CONSTITUTIONAL:  [ ]  fever   [ ]  chills  PHYSICAL EXAMINATION:  General: The patient is a well-nourished male, in no acute distress. Vital signs are BP 134/87  Pulse 76  Resp 16  Ht 6' (1.829 m)  Wt 161 lb (73.029 kg)  BMI 21.84  kg/m2  SpO2 99% Pulmonary: There is a good air exchange bilaterally without wheezing or rales. Abdomen: Soft and non-tender with normal pitch bowel sounds. Musculoskeletal: There are no major deformities.  There  is no significant extremity pain. Neurologic: No focal weakness or paresthesias are detected, Skin: There are no ulcer or rashes noted. Psychiatric: The patient has normal affect. Extremities: He does have expansile mass in his left thigh. This is above the popliteal fossa and is more in the level of the mid distal superficial femoral artery. There is no tenderness. This does appear to be quite large. Do not palpate pedal pulses bilaterally. He does have cyanotic changes in both feet more likely related to his venous insufficiency.     Impression and Plan:  A debilitated 78 year old gentleman with known diffuse peripheral aneurysmal disease. He certainly has had a significant change in his physical exam in the left thigh. I discussed this at length with the patient and his daughter present. I have recommended CT angiogram for further evaluation. I explained the certainly would still be at high risk for surgical repair at this may be of more concern than on his prior study. We have scheduled a CT angiogram of his abdomen and pelvis and runoff and we'll see him for followup in one week    Chardonay Scritchfield [facility] [place] Office: [phone]

## 2011-04-04 ENCOUNTER — Encounter: Payer: Self-pay | Admitting: Vascular Surgery

## 2011-04-04 ENCOUNTER — Ambulatory Visit
Admission: RE | Admit: 2011-04-04 | Discharge: 2011-04-04 | Disposition: A | Discharge status: Home / Self Care | Payer: Medicare Other | Source: Ambulatory Visit | Attending: Vascular Surgery | Admitting: Vascular Surgery

## 2011-04-04 DIAGNOSIS — I724 Aneurysm of artery of lower extremity: Secondary | ICD-10-CM

## 2011-04-04 MED ORDER — IOHEXOL 350 MG/ML SOLN
150.0000 mL | Freq: Once | INTRAVENOUS | Status: AC | PRN
  Administered 2011-04-04: 150 mL via INTRAVENOUS

## 2011-04-05 ENCOUNTER — Ambulatory Visit (INDEPENDENT_AMBULATORY_CARE_PROVIDER_SITE_OTHER): Payer: Medicare Other | Admitting: Vascular Surgery

## 2011-04-05 ENCOUNTER — Ambulatory Visit: Payer: Medicare Other | Admitting: Vascular Surgery

## 2011-04-05 ENCOUNTER — Other Ambulatory Visit: Payer: Medicare Other

## 2011-04-05 ENCOUNTER — Encounter: Payer: Self-pay | Admitting: Vascular Surgery

## 2011-04-05 VITALS — BP 139/93 | HR 71 | Resp 18 | Ht 72.0 in | Wt 165.0 lb

## 2011-04-05 DIAGNOSIS — I714 Abdominal aortic aneurysm, without rupture, unspecified: Secondary | ICD-10-CM | POA: Insufficient documentation

## 2011-04-05 NOTE — Progress Notes (Signed)
The patient has today for followup of his CT scan from today. I had seen in one week ago at which time he physical exam had marked progression of the left popliteal artery aneurysm this felt to be a quite large encompassing the majority of his thigh from the above knee position. He has no new symptoms.  CT scan: This shows diffuse aneurysms with slight progression in his hypogastric artery aneurysms bilaterally and also iliac and common femoral arteries bilaterally. There is also slight progression in his right popliteal artery. Of note he does have significant change with a very large aneurysm with possible contained rupture in his left distal thigh.  Physical exam no change he does have this large expansile mass comes in the majority of his distal thigh on the left. He does have palpable popliteal pulse below this.  Impression and plan: Diffuse aneurysmal disease and a very frail 78 year old gentleman with known ejection fraction of 10%. I certainly would not recommend any evaluation on any of his aneurysms except for the left popliteal and distal thigh aneurysm. Feel that this is a significant refer further complications including rupture or thrombosis due to its large size. He wishes to consider treatment options. He has not seen his cardiologist Dr. Eden Emms for over one year. We will schedule appointment for further cardiac risk stratification and optimization. We will see him again in 1 months. I explained that he may be a candidate for stent grafting and would be a candidate for open surgery if his cardiac status would allow

## 2011-04-11 ENCOUNTER — Ambulatory Visit (INDEPENDENT_AMBULATORY_CARE_PROVIDER_SITE_OTHER): Payer: Medicare Other | Admitting: Pharmacist

## 2011-04-11 DIAGNOSIS — I4891 Unspecified atrial fibrillation: Secondary | ICD-10-CM

## 2011-04-11 DIAGNOSIS — I2699 Other pulmonary embolism without acute cor pulmonale: Secondary | ICD-10-CM

## 2011-04-11 DIAGNOSIS — I82409 Acute embolism and thrombosis of unspecified deep veins of unspecified lower extremity: Secondary | ICD-10-CM

## 2011-04-11 DIAGNOSIS — Z7901 Long term (current) use of anticoagulants: Secondary | ICD-10-CM

## 2011-04-11 LAB — POCT INR: INR: 2.4

## 2011-04-27 ENCOUNTER — Ambulatory Visit (INDEPENDENT_AMBULATORY_CARE_PROVIDER_SITE_OTHER): Payer: Medicare Other | Admitting: Cardiovascular Disease

## 2011-04-27 ENCOUNTER — Other Ambulatory Visit: Payer: Self-pay

## 2011-04-27 ENCOUNTER — Encounter: Payer: Self-pay | Admitting: Cardiovascular Disease

## 2011-04-27 ENCOUNTER — Ambulatory Visit (HOSPITAL_COMMUNITY): Payer: Medicare Other | Attending: Cardiovascular Disease

## 2011-04-27 VITALS — BP 108/78 | HR 84 | Resp 18 | Ht 74.0 in | Wt 162.0 lb

## 2011-04-27 DIAGNOSIS — I739 Peripheral vascular disease, unspecified: Secondary | ICD-10-CM

## 2011-04-27 DIAGNOSIS — Z01810 Encounter for preprocedural cardiovascular examination: Secondary | ICD-10-CM

## 2011-04-27 DIAGNOSIS — I509 Heart failure, unspecified: Secondary | ICD-10-CM | POA: Insufficient documentation

## 2011-04-27 DIAGNOSIS — I714 Abdominal aortic aneurysm, without rupture, unspecified: Secondary | ICD-10-CM | POA: Insufficient documentation

## 2011-04-27 DIAGNOSIS — G609 Hereditary and idiopathic neuropathy, unspecified: Secondary | ICD-10-CM | POA: Insufficient documentation

## 2011-04-27 DIAGNOSIS — E785 Hyperlipidemia, unspecified: Secondary | ICD-10-CM

## 2011-04-27 DIAGNOSIS — R011 Cardiac murmur, unspecified: Secondary | ICD-10-CM

## 2011-04-27 DIAGNOSIS — I10 Essential (primary) hypertension: Secondary | ICD-10-CM

## 2011-04-27 DIAGNOSIS — Z86718 Personal history of other venous thrombosis and embolism: Secondary | ICD-10-CM | POA: Insufficient documentation

## 2011-04-27 DIAGNOSIS — I4891 Unspecified atrial fibrillation: Secondary | ICD-10-CM

## 2011-04-27 DIAGNOSIS — Z01818 Encounter for other preprocedural examination: Secondary | ICD-10-CM

## 2011-04-27 DIAGNOSIS — I872 Venous insufficiency (chronic) (peripheral): Secondary | ICD-10-CM | POA: Insufficient documentation

## 2011-04-27 NOTE — Assessment & Plan Note (Signed)
Cholesterol is at goal.  Continue current dose of statin and diet Rx.  No myalgias or side effects.  F/U  LFT's in 6 months. Lab Results  Component Value Date   LDLCALC 36 09/07/2010

## 2011-04-27 NOTE — Assessment & Plan Note (Signed)
Chronic afib with good rate control.  Will need telemetry post op

## 2011-04-27 NOTE — Assessment & Plan Note (Signed)
Biggest risk is age and generalized depbility.  Discussed with patient and daughter.  He cannot walk because of aneurysm and it has grown quite large over a short periord of time.  Risk of rupture outweighs risk of his surgery especially since abdomen will not be entered.  Also issues with MRSA and wound infection will have to be watched closeley

## 2011-04-27 NOTE — Progress Notes (Signed)
Glenn Barron is seen today for F/U of multiple issues. He had a tough year and his wife passed a few months ago. He has home care helping a lot now. He has a history of PAF, TIA, AAA with Aobifem. He is still seeing the coumadin clinic. His HTN and lipids are under good control. He is still depressed about his wifes death. He needs to have his sotolol stopped He has SSS with relative bradycardia and some wenkebach on ECG. He has some dizzyness but no frank syncope and seems to ambulated marginally with a walker. He does not want a pacer and currently there is not a firm indication for one  Has had an expanding left popliteal aneurysm that needs surgery.  It is over 20m , painful and precludes him from exercising.  Despite his age and comormidities I agree with Dr Early that he should have it fixed.  Has not had an echo since 2004 when his EF was 50-55% and we will check one before surgery.  Has afib with chronic coumadin.  Can be held 4-5 days before with no bridge.  Has had IVC filter 12 years ago or so for post op DVT and particular care will need to be given to lovenox and coumadin after surgery.  ROS: Denies fever, malais, weight loss, blurry vision, decreased visual acuity, cough, sputum, SOB, hemoptysis, pleuritic pain, palpitaitons, heartburn, abdominal pain, melena, lower extremity edema, claudication, or rash.  All other systems reviewed and negative  General: Affect appropriate Healthy:  appears stated age HEENT: normal Neck supple with no adenopathy JVP normal no bruits no thyromegaly Lungs clear with no wheezing and good diaphragmatic motion Heart:  S1/S2 systolic  murmur, no rub, gallop or click PMI normal Abdomen: benighn, BS positve, no tenderness, S/P Ao bifem no bruit.  No HSM or HJR Distal pulses intact with no bruits Large palpable left popliteal aneurysm  No edema Neuro non-focal Skin warm and dry No muscular weakness   Current Outpatient Prescriptions  Medication Sig  Dispense Refill  . acetaminophen (TYLENOL) 650 MG CR tablet Take 650 mg by mouth 2 (two) times daily.      Marland Kitchen albuterol (PROAIR HFA) 108 (90 BASE) MCG/ACT inhaler Inhale 1-2 puffs into the lungs every 6 (six) hours as needed.  1 Inhaler  11  . aspirin 81 MG tablet Take 81 mg by mouth daily.        Marland Kitchen atorvastatin (LIPITOR) 20 MG tablet take 1 tablet by mouth once daily  30 tablet  5  . calcium carbonate (TUMS - DOSED IN MG ELEMENTAL CALCIUM) 500 MG chewable tablet As needed       . cholecalciferol (VITAMIN D) 1000 UNITS tablet Take 1,000 Units by mouth daily.        Marland Kitchen dextromethorphan-guaiFENesin (MUCINEX DM) 30-600 MG per 12 hr tablet Take 1 tablet by mouth every 12 (twelve) hours.        . famotidine (PEPCID) 10 MG tablet Take 10 mg by mouth at bedtime.        . fluticasone (FLONASE) 50 MCG/ACT nasal spray instill 2 sprays into each nostril once daily  16 g  11  . Fluticasone-Salmeterol (ADVAIR DISKUS) 250-50 MCG/DOSE AEPB Inhale 1 puff into the lungs 2 (two) times daily. Rinse mouth well  60 each  11  . furosemide (LASIX) 40 MG tablet Take 2 tablets by mouth in the morning and 1 tablet by mouth in the evening.  120 tablet  5  . oxyCODONE (OXY IR/ROXICODONE)  5 MG immediate release tablet Take 1 tab every 4-6 hours as needed pain as needed only per pt       . potassium chloride SA (KLOR-CON M20) 20 MEQ tablet Take 1 tablet (20 mEq total) by mouth 2 (two) times daily.  60 tablet  5  . predniSONE (DELTASONE) 10 MG tablet Take 1/2 tablet by mouth every other day      . predniSONE (DELTASONE) 10 MG tablet take 1/2 tablet by mouth once daily  30 tablet  3  . PROBIOTIC CAPS Take 1 cap daily       . sennosides-docusate sodium (SENOKOT-S) 8.6-50 MG tablet Use as needed for constipation       . warfarin (COUMADIN) 4 MG tablet TAKE AS DIRECTED BY COUMADIN CLINIC  100 tablet  2    Allergies  Amoxicillin and Doxycycline  Electrocardiogram:  Afib rate 74  RBBB otherwise normal  Assessment and Plan

## 2011-04-27 NOTE — Assessment & Plan Note (Signed)
Aorto bifem is ok.  Large left popliteal aneurysm.  Clear to have surgery.  Will get echo today to make sure EF is still ok since it has not been assessed in a while. No need for lovenox bridge preop but has had post op DVT/PE after last vascular surgery with IVC filter.  Meticulous DVT prophylaxis post repair of popliteal aneurysm is needed

## 2011-04-27 NOTE — Assessment & Plan Note (Signed)
This is pretty debilitating.  Cant feel the bottom of his feet well.  Limits his walking F/U Kriste Basque

## 2011-04-27 NOTE — Patient Instructions (Signed)
Your physician recommends that you schedule a follow-up appointment in: 6 MONTHS WITH DR [facility] Your physician recommends that you continue on your current medications as directed. Please refer to the Current Medication list given to you today. Your physician has requested that you have an echocardiogram. Echocardiography is a painless test that uses sound waves to create images of your heart. It provides your doctor with information about the size and shape of your heart and how well your heart's chambers and valves are working. This procedure takes approximately one hour. There are no restrictions for this procedure. DX MUR MUR

## 2011-04-27 NOTE — Assessment & Plan Note (Signed)
Well controlled.  Continue current medications and low sodium Dash type diet.    

## 2011-05-03 ENCOUNTER — Encounter: Payer: Self-pay | Admitting: Pulmonary Disease

## 2011-05-03 ENCOUNTER — Other Ambulatory Visit (INDEPENDENT_AMBULATORY_CARE_PROVIDER_SITE_OTHER): Payer: Medicare Other

## 2011-05-03 ENCOUNTER — Ambulatory Visit (INDEPENDENT_AMBULATORY_CARE_PROVIDER_SITE_OTHER): Payer: Medicare Other | Admitting: Pulmonary Disease

## 2011-05-03 VITALS — BP 110/76 | HR 79 | Temp 97.7°F | Ht 72.0 in | Wt 164.7 lb

## 2011-05-03 DIAGNOSIS — R0609 Other forms of dyspnea: Secondary | ICD-10-CM

## 2011-05-03 DIAGNOSIS — I2699 Other pulmonary embolism without acute cor pulmonale: Secondary | ICD-10-CM

## 2011-05-03 DIAGNOSIS — I82409 Acute embolism and thrombosis of unspecified deep veins of unspecified lower extremity: Secondary | ICD-10-CM

## 2011-05-03 DIAGNOSIS — R0600 Dyspnea, unspecified: Secondary | ICD-10-CM

## 2011-05-03 DIAGNOSIS — E785 Hyperlipidemia, unspecified: Secondary | ICD-10-CM

## 2011-05-03 DIAGNOSIS — R0989 Other specified symptoms and signs involving the circulatory and respiratory systems: Secondary | ICD-10-CM

## 2011-05-03 DIAGNOSIS — K219 Gastro-esophageal reflux disease without esophagitis: Secondary | ICD-10-CM

## 2011-05-03 DIAGNOSIS — I4891 Unspecified atrial fibrillation: Secondary | ICD-10-CM

## 2011-05-03 DIAGNOSIS — I739 Peripheral vascular disease, unspecified: Secondary | ICD-10-CM

## 2011-05-03 DIAGNOSIS — I872 Venous insufficiency (chronic) (peripheral): Secondary | ICD-10-CM

## 2011-05-03 DIAGNOSIS — Z7901 Long term (current) use of anticoagulants: Secondary | ICD-10-CM

## 2011-05-03 DIAGNOSIS — C61 Malignant neoplasm of prostate: Secondary | ICD-10-CM

## 2011-05-03 DIAGNOSIS — I10 Essential (primary) hypertension: Secondary | ICD-10-CM

## 2011-05-03 DIAGNOSIS — M1990 Unspecified osteoarthritis, unspecified site: Secondary | ICD-10-CM

## 2011-05-03 LAB — CBC WITH DIFFERENTIAL/PLATELET
Eosinophils Relative: 0.7 % (ref 0.0–5.0)
HCT: 47.4 % (ref 39.0–52.0)
Hemoglobin: 15.7 g/dL (ref 13.0–17.0)
Lymphs Abs: 2.3 10*3/uL (ref 0.7–4.0)
Monocytes Relative: 7 % (ref 3.0–12.0)
Neutro Abs: 6.7 10*3/uL (ref 1.4–7.7)
RBC: 4.83 Mil/uL (ref 4.22–5.81)
WBC: 9.9 10*3/uL (ref 4.5–10.5)

## 2011-05-03 LAB — BASIC METABOLIC PANEL
GFR: 71.04 mL/min (ref >60.00)
Potassium: 4.2 mEq/L (ref 3.5–5.1)
Sodium: 136 mEq/L (ref 135–145)

## 2011-05-03 NOTE — Patient Instructions (Signed)
Today we updated your med list in our EPIC system...    Continue your current medications the same...  Today we did your follow up blood work including a protime...    We will call you w/ the results tomorrow & let you know about any changes in your meds...  Call for any questions...  Let's plan a follow up visit in several months after your surgery & rehab.Marland KitchenMarland Kitchen

## 2011-05-03 NOTE — Progress Notes (Addendum)
Subjective:    Patient ID: Glenn Barron, male    DOB: 1922/11/03, 78 y.o.   MRN: [id number]  HPI 78 y/o WM here for a follow up visit... he has mult med problems as noted below... Followed for general medical purposes w/ hx of recurrent bronchitis & allergies; hx PTE/ DVT/ & IVC filter on Coumadin; HBP; AFib; cerebrovasc dis w/ hx stroke & prev right CAE; ASPVD w/ prev AAA repair & subseq bilat Ao-iliac bypasses; Hyperchol; Prostate cancer; neuropathy; and anxiety/ depression...  ~  March 22, 2010:   6 wk ROV- still weak but improving slowly> He has mult somatic complaints but overall appears stable;  he is in AFib clinically w/ controlled VR & HR=90, continues on Coumadin via CC;  he has persist LE edema w/ pitting & wt up 3#> on Lasix 40mg /d, states he's restricting sodium (but eating TV dinners), elevating legs, wearing support hose;  BP well controlled at 116/80 & we discussed 2gm sodium restriction & incr Lasix 40-80mg  daily...    Breathing is stable & he's been on Pred 10mg - 1/2 daily;  we discussed weaning to 5mg  Qod, continue Advair, etc...    He has a prob w/ his right hand> can't dorsiflex the 3rd-4th fingers & has appt w/ DrWeingold to eval...    He notes some constipation & we have rec Miralax daily if necessary for regulation, along w/ his Senakot-S, Align, etc...  ~  June 01, 2010:  23mo ROV- generally stable & gaining strength slowly (he has good exerc program, still has help at home [facility] per day);  He had right Bell's Palsey 3/12 & went to ER then called [place] & we Rx w/ Valtrex & Pred Dosepak ==> resolved & back to normal now;  Breathing stable on the Pred 5mg  Qod, BP looks good & edema is sl better w/ his incr to 60mg /d> we discussed checking BMet (K=4.6, BUN=22, Creat=1.1) and incr Lasix to 80mg  Qam;  CV stable w/o CP or cerebral ischemic symptoms...  We reviewed his meds & decided to continue others the same for now...  ~  September 01, 2010:  23mo ROV & he remains stable, had SHINGLES  vaccine 6/12 & we gave him the TDAP today; he follows a low sodium diet & will return for FASTING blood work next week... See prob list below>>  ~  January 04, 2011:  30mo ROV & he reports generally stable> cough & congestion improved, using MucinexDM; BP stable on Lasix+KCl; Cardiac stable, denies cerebral ischemic symptoms; labs from 7/12 reviewed; had skin cancer removed by Derm...  ~  February 22, 2011:  6wk ROV & add-on for f/u cellulitis> he saw TP 12/12 w/ acute swelling & redness right foot/ ankle/ leg, area is tender, no broken skin or trauma reported, no drainage, denies fever, no CP/ SOB, etc;  [name] already on Coumadin, followed in the CC & therapeutic, he had IVC filter placed yrs ago; Treated w/ Doxy po & infective symptoms improved> less red/ inflammed etc but still swollen- VI w/ edema up to his thigh on Lasix 40mg - 2Qam...  We decided to incr lasix to 80mg  Bid, 2gm Na+ diet (he's been eating TV dinners, soups, etc), elevation, etc... Plan f/u w/ labs in 30month.    Labs 12/12 showed Chems- ok w/ BUN=19, Creat=1.9, BNP=237;  CBC w/ Hg=15.5, WBC=10.9;  Wound cult- neg, no staph etc;  XRay right foot showed soft tissue swelling, no bone abn seen...  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     Labs today showed:  Chems- ok w/ BUN=32, Creat=1.1, K=5.0, BNP=159;  CBC- ok w/ Hg=16.1, WBC=9.6.Marland KitchenMarland Kitchen We decided to decr the Lasix40 slightly to 2Qam & 1Qpm...  Plan ROV 6weeks.  ~  May 03, 2011:  6week [place] & he has seen DrEarly w/CT Angio done 2/13 w/ signif enlargement of aneurysm in the distal SFA & Pop art; mult other  aneurysms seen as well & several are bigger as well (see report); he has also had f/u DrNishan 3/13 & EKG shows AFib, rate74, RBBB, septal infarct & poor R progression... 2DEcho 3/13 showed mod LVH, norm LVF w/ EF=55-60%, mild AS, mild MR, mod LAdil measuring 52mm...  OK for surgery>> LABS 3/13:  Chems- stable w/ BUN=27 Creat=1.1;  CBC- ok w/ Hg=15.7;  BNP= 171;  Protime- 32.2/ INR=2.9 & we called to CC to adjust down...         Problem List:  OPHTHALMOLOGY - followed by [facility]... ~  7/12:  He reports that recent check up was OK...  ALLERGIC RHINITIS (ICD-477.9) - on OTC Antihist Prn and FLONASE Qhs...  BRONCHITIS, RECURRENT (ICD-491.9) - on ADVAIR 250Bid, PROVENTIL HFA Prn, & PREDNISONE 5mg  Qod... ~  CXR 7/11 showed some hyperinflation, no change scarring & vol loss RLL, NAD.Marland Kitchen. ~  CXR 11/11 in hosp showed scarring right base w/ incr opac r/o superimposed pneum > he was placed on Pred during this adm & weaned slowly as outpt... ~  CXR 12/11 here showed baseline film w/ basilar scarring & NAD... on Pred 10mg /d & asked to wean to 5mg /d... ~  Continued wean of Pred to 5mg  Qod & pt remains stable w/o change in dyspnea, & w/o resp exac...  Hx of PULMONARY EMBOLISM (ICD-415.19) - he had a DVT w/ PTE in 2001 w/ IVC filter placed... he remains on COUMADIN w/ regular checks in the Coumadin Clinic- doing well.  HYPERTENSION (ICD-401.9) - on LASIX monotherapy (currently 80mg /d) w/ K20/d... ~  labs 10/10 showed BUN= 17, Creat= 1.0, K= 4.7 ~  11/10: c/o incr dizzy & weak- rec to decr the Lasix to 1/2 tab daily... ~  2/11:  hosp records show norm CBC, BMet, TSH, Urine. ~  7/11: c/o incr edema & wants to incr Lasix to 1-2 Qam; BUN=28, Creat=1.2, K=4.2, BNP=97 ~  Labs 11/11 in hosp on Lasix 40mg /d showed BUN/ Creat= 29/ 1.5 ==> 18/ 0.9 & BNP= 113. ~  4/12:  On Lasix40mg - 1.5 tabsQam & BP= 118/72; notes some fatigue, intermittent dyspnea, dizziness, & edema; but denies HA, visual changes, CP,  palipit, syncope, etc; labs show K=4.6, BUN=22, Creat=1.1, rec incr Lasix to 80mg  Qam... ~  7/12:  BP= 124/82 on Lasix 80mg /d... ~  11/12:  BP= 122/76 and he denies CP, palpit, dizzy, ch in SOB or edema... ~  2/13:  BP= 110/62 on Lasix 80Bid; denies CP, palpit, dizzy, ch in SOB/DOE, note decr in edema & wt.  PAROXYSMAL ATRIAL FIBRILLATION (ICD-427.31) - prev on Sotalol80 but stopped 12/10 by DrNishan...  remains on Coumadin via clinic... he  was prev maintaining NSR but has slipped back into AFib w/ controlled VR... ~  Last 2DEcho 2004 showed EF 50-55% w/ infer HK, incr AoV thickness, mild AI, mild LAdil, no thrombus seen... ~  EKG 12/10 showed SBrady w/ RBBB & 1st degree AVB... ~  6/11:  Last seen by DrNishan> he was still regular w/ pulse= 62... ~  Noted to be irreg 12/11 OV & clinical AFib w/ controlled VR...  CEREBROVASCULAR DISEASE (ICD-437.9) - on ASA 81mg /d in addition to the Coumadin...  ~  he is s/p right carotid endarterectomy 7/01 by Windell Moulding...  ~  MRI 8/04 showed intracranial atherosclerotic changes w/ marked ectasia of V-B sys, sm right vertebral w/ prox stenosis, & >50% left ICA stenosis...  ~  CT Brain 1/09 showed atrophy and chr microvacs ischemic changes & remote IC lacune... ~  CDoppler 5/09 showed patent right CAE w/ DPA, mild plaque in bulb, 0-39% bilat ICA stenoses- no change... ~  CDoppler 5/10 showed patent right CAE w/ DPA, stable mild left carotid dis, 0-39% bilat- f/u 9yr.  PERIPHERAL VASCULAR DISEASE (ICD-443.9) - s/p AAA repair 8/00 & bilat Ao to iliac bypasses in 2001 by DrLawson> he had mult post-op complications and a long hosp course... 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  ~  2/13:  With loss of edema & decr 10# on Lasix80Bid> there is an indurated swollen area in the post aspect of left  lower thigh above the pop fossa> pt referred to DrEarly to recheck pop art aneurysm...  VENOUS INSUFFICIENCY, CHRONIC (ICD-459.81) - chr ven insuffic due to his mult DVT episodes & prev IVC filter ?2001... he's had incr trouble w/ LE edema recently & requires incr Lasix dosing... ~  Venous Dopplers right leg 8/09 showed no evid for DVT, no superfic clots, etc... ~  8/09 developed cellulitis right leg which resolved to Keflex & local care... ~  [facility] 2/11 by [facility] for left hand cellulitis... ~  [facility] 11/11 by [facility] for right arm cellulitis... ~  2/12:  exam w/ persist incr LE edema w/ pitting> rec to incr LASIX 40==>80mg /d. ~  12/12: episode of LLE cellulitis w/ incr edema, therapeutic INR, treated w/ Doxy & incr Lasix... ~  1-2/13: cellulitis resolved, incr edema rx w/ incr Lasix & he lost 10# on Lasix80Bid...  HYPERLIPIDEMIA (ICD-272.4) - on LIPITOR 20mg /d & prev FLP at goal on this dose... ~  FLP 5/08 showed TChol 124, Tg 99, HDL 40, LDL 65 ~  FLP 5/09 showed TChol 120, TG 93, HDL 36, LDL 66 ~  FLP 10/10 showed TChol 123, TG 96, HDL 47, LDL 57 ~  FLP 7/12 showed TChol 105, TG 69, HDL 55, LDL 36  GERD (ICD-530.81) - on PEPCID 20mg /d...  DIVERTICULOSIS OF COLON (ICD-562.10) IRRITABLE BOWEL SYNDROME (ICD-564.1) - he has diarrhea (uses Immodium) & constipation (uses Miralax/ Senakot-S) w/ a tough job regulating (uses ALIGN)... COLONIC POLYPS (ICD-211.3) - last colonoscopy 8/99 showed divertics, no recurrent polyps... last polyps removed 1996= adenomatous... also had incidental cecal lipoma seen.  Hx of PROSTATE CANCER (ICD-185) - s/p radical prostatectomy in 1994... he is followed by DrWrenn & has urinary incontinence for which he uses pads etc... ~  labs 10/10 showed PSA= 0.03 ~  labs 7/11 showed PSA= 0.04 ~  Labs 7/12  showed PSA= 0.08  DEGENERATIVE JOINT DISEASE (ICD-715.90) - he has a known lumbar disc disease and cervical spondylosis... he also takes Vit D 1000 u daily...  ~  labs 7/11 showed  Vit D level = 30... continue OTC supplement.  Hx of STROKE (ICD-434.91) - he was hospitalized 8/04 w/ stroke and MRI showed acute left pontine infarct + diffuse intracranial atherosclerotic changes- see above- on ASA & Coumadin.  PERIPHERAL NEUROPATHY (ICD-356.9) - he has LBP, neuropathy and a gait abnormality...  ANXIETY DEPRESSION (ICD-300.4) - wife, Kathie Rhodes, died 07-16-2022 on hospice (severe pulm fibrosis from scleroderma/ CREST/ etc)...   Past Surgical History  Procedure Date  . Prostateectomy   . Cataract extraction   . Inguinal hernia repair   . Carotid endarterectomy     right  . Abdominal aortic aneurysm repair   . Skin cancer excision     Outpatient Encounter Prescriptions as of 05/03/2011  Medication Sig Dispense Refill  . acetaminophen (TYLENOL) 650 MG CR tablet Take 650 mg by mouth 2 (two) times daily.      Marland Kitchen albuterol (PROAIR HFA) 108 (90 BASE) MCG/ACT inhaler Inhale 1-2 puffs into the lungs every 6 (six) hours as needed.  1 Inhaler  11  . aspirin 81 MG tablet Take 81 mg by mouth daily.        Marland Kitchen atorvastatin (LIPITOR) 20 MG tablet take 1 tablet by mouth once daily  30 tablet  5  . calcium carbonate (TUMS - DOSED IN MG ELEMENTAL CALCIUM) 500 MG chewable tablet As needed       . cholecalciferol (VITAMIN D) 1000 UNITS tablet Take 1,000 Units by mouth daily.        Marland Kitchen dextromethorphan-guaiFENesin (MUCINEX DM) 30-600 MG per 12 hr tablet Take 1 tablet by mouth every 12 (twelve) hours.        . famotidine (PEPCID) 10 MG tablet Take 10 mg by mouth at bedtime.        . fluticasone (FLONASE) 50 MCG/ACT nasal spray instill 2 sprays into each nostril once daily  16 g  11  . Fluticasone-Salmeterol (ADVAIR DISKUS) 250-50 MCG/DOSE AEPB Inhale 1 puff into the lungs 2 (two) times daily. Rinse mouth well  60 each  11  . furosemide (LASIX) 40 MG tablet Take 2 tablets by mouth in the morning and 1 tablet by mouth in the evening.  120 tablet  5  . oxyCODONE (OXY IR/ROXICODONE) 5 MG immediate release  tablet Take 1 tab every 4-6 hours as needed pain as needed only per pt       . potassium chloride SA (KLOR-CON M20) 20 MEQ tablet Take 1 tablet (20 mEq total) by mouth 2 (two) times daily.  60 tablet  5  . predniSONE (DELTASONE) 10 MG tablet take 1/2 tablet by mouth once daily  30 tablet  3  . PROBIOTIC CAPS Take 1 cap daily       . sennosides-docusate sodium (SENOKOT-S) 8.6-50 MG tablet Use as needed for constipation       . warfarin (COUMADIN) 4 MG tablet TAKE AS DIRECTED BY COUMADIN CLINIC  100 tablet  2  . DISCONTD: predniSONE (DELTASONE) 10 MG tablet Take 1/2 tablet by mouth every other day        Allergies  Allergen Reactions  . Amoxicillin     REACTION: rash  . Doxycycline     Rash    Current Medications, Allergies, Past Medical History, Past Surgical History, Family History, and Social History were reviewed in [place]  Link electronic medical record.   Review of Systems         See HPI - all other systems neg except as noted... The patient complains of decreased hearing, dyspnea on exertion, peripheral edema, muscle weakness, difficulty walking, and depression.  The patient denies anorexia, fever, weight loss, weight gain, vision loss, hoarseness, chest pain, syncope, prolonged cough, headaches, hemoptysis, abdominal pain, melena, hematochezia, severe indigestion/heartburn, hematuria, incontinence, suspicious skin lesions, transient blindness, unusual weight change, abnormal bleeding, enlarged lymph nodes, and angioedema.     Objective:   Physical Exam     WD, sl thin, 78 y/o WM in NAD... GENERAL:  Alert & oriented; pleasant & cooperative... HEENT:  [place]/AT, EOM-wnl, PERRLA, EACs-clear, TMs-wnl, NOSE-clear, THROAT-clear & wnl. NECK:  Supple w/ fairROM; no JVD; s/p right CAE, no bruits; no thyromegaly or nodules palpated; no lymphadenopathy. CHEST: decr BS bilat- few rhonchi in RLL area, no consolidation, no wheezing... HEART:  sl irregular rhythm; w/o murmur, rubs, or gallop  heard... ABDOMEN:  Soft & nontender; normal bowel sounds; no organomegaly or masses detected. EXT: without deformities, mod arthritic changes; ambulates w/ walker; +venous stasis, 2+edema noted. NEURO:  CN's intact;  gait abn; no focal neuro deficits,  +generalized weakness... DERM:  No lesions noted; no rash etc...  RADIOLOGY DATA:  Reviewed in the EPIC EMR & discussed w/ the patient...    >>XRay right foot 12/12 showed soft tissue swelling, no bone abn seen...  LABORATORY DATA:  Reviewed in the EPIC EMR & discussed w/ the patient...    >>Labs 12/12:  Chems- ok w/ BUN=19, Creat=1.0, BNP=237;  CBC w/ Hg=15.5, WBC=10.9;  Wound cult- neg, no staph etc...    >>Labs 2/13 showed:  Chems- ok w/ BUN=32, Creat=1.1, K=5.0, BNP=159;  CBC- ok w/ Hg=16.1, WBC=9.6.Marland KitchenMarland Kitchen We decided to decr the Lasix40 slightly to 2Qam & 1Qpm...  Plan ROV 6weeks.   Assessment & Plan:   HBP>  Remains under good control w/ his diet + Lasix monotherapy;  Last OV lasix incr to 80Bid & today we will decr slightly to 80mg AM & 40mg PM...  AFib>  Followed by Walker Kehr for cards, on Coumadin via clinic w/ therapeutic INR, controlled VR...  VASC DISEASE>  He has Cerebrovasc dis & Periph vasc dis followed by Walker Kehr & [facility]- DrEarly, Windell Moulding;  With wt loss & decr edema in LLE he now has palp mass/ indurated area behind the left lower thigh above the pop fossa & this expanding pop art aneurysm needs surg...   Venous Insuffic w/ Edema>  Improved after Lasix 80mg  Bid as noted; Labs reviewed & we decided to decr to 80mg Qam & 40mg Qpm...  Hx DVT w/ PTE in 2001 w/ IVC filter placed & on Coumadin followed in the CC ever since...  Hyperlipidemia>  Controlled on Lip20 + diet efforts...  GI> GERD, Divertics, IBS, Polyps>  Stable on Pepcid, Miralax, Align...  GU> Hx prostate cancer, urinary incont>  Followed by DrWrenn...  Hx Stroke>  This occurred in 2004, on ASA & Coumadin...  Hx of Bell's Palsey>  S/p Right Bell's Palsey 3/12, treated w/  Valtrex & Dosepak & resolved back to baseline...  Other medical problems as noted...     Patient's Medications  New Prescriptions   No medications on file  Previous Medications   ACETAMINOPHEN (TYLENOL) 650 MG CR TABLET    Take 650 mg by mouth 2 (two) times daily.   ALBUTEROL (PROAIR HFA) 108 (90 BASE) MCG/ACT INHALER    Inhale 1-2 puffs into the lungs  every 6 (six) hours as needed.   ASPIRIN 81 MG TABLET    Take 81 mg by mouth daily.     ATORVASTATIN (LIPITOR) 20 MG TABLET    take 1 tablet by mouth once daily   CALCIUM CARBONATE (TUMS - DOSED IN MG ELEMENTAL CALCIUM) 500 MG CHEWABLE TABLET    As needed    CHOLECALCIFEROL (VITAMIN D) 1000 UNITS TABLET    Take 1,000 Units by mouth daily.     DEXTROMETHORPHAN-GUAIFENESIN (MUCINEX DM) 30-600 MG PER 12 HR TABLET    Take 1 tablet by mouth every 12 (twelve) hours.     FAMOTIDINE (PEPCID) 10 MG TABLET    Take 10 mg by mouth at bedtime.     FLUTICASONE (FLONASE) 50 MCG/ACT NASAL SPRAY    instill 2 sprays into each nostril once daily   FLUTICASONE-SALMETEROL (ADVAIR DISKUS) 250-50 MCG/DOSE AEPB    Inhale 1 puff into the lungs 2 (two) times daily. Rinse mouth well   FUROSEMIDE (LASIX) 40 MG TABLET    Take 2 tablets by mouth in the morning and 1 tablet by mouth in the evening.   OXYCODONE (OXY IR/ROXICODONE) 5 MG IMMEDIATE RELEASE TABLET    Take 1 tab every 4-6 hours as needed pain as needed only per pt    POTASSIUM CHLORIDE SA (KLOR-CON M20) 20 MEQ TABLET    Take 1 tablet (20 mEq total) by mouth 2 (two) times daily.   PREDNISONE (DELTASONE) 10 MG TABLET    take 1/2 tablet by mouth once daily   PROBIOTIC CAPS    Take 1 cap daily    SENNOSIDES-DOCUSATE SODIUM (SENOKOT-S) 8.6-50 MG TABLET    Use as needed for constipation    WARFARIN (COUMADIN) 4 MG TABLET    TAKE AS DIRECTED BY COUMADIN CLINIC  Modified Medications   No medications on file  Discontinued Medications   PREDNISONE (DELTASONE) 10 MG TABLET    Take 1/2 tablet by mouth every other day

## 2011-05-04 ENCOUNTER — Ambulatory Visit: Payer: Medicare Other | Admitting: Pulmonary Disease

## 2011-05-04 ENCOUNTER — Ambulatory Visit: Payer: Self-pay | Admitting: Cardiovascular Disease

## 2011-05-04 DIAGNOSIS — I4891 Unspecified atrial fibrillation: Secondary | ICD-10-CM

## 2011-05-04 DIAGNOSIS — Z7901 Long term (current) use of anticoagulants: Secondary | ICD-10-CM

## 2011-05-04 DIAGNOSIS — I2699 Other pulmonary embolism without acute cor pulmonale: Secondary | ICD-10-CM

## 2011-05-04 DIAGNOSIS — I82409 Acute embolism and thrombosis of unspecified deep veins of unspecified lower extremity: Secondary | ICD-10-CM

## 2011-05-04 NOTE — Progress Notes (Signed)
Addended by: Marcellus Scott on: 05/04/2011 09:17 AM   Modules accepted: Orders

## 2011-05-09 ENCOUNTER — Encounter: Payer: Self-pay | Admitting: Vascular Surgery

## 2011-05-10 ENCOUNTER — Encounter: Payer: Self-pay | Admitting: *Deleted

## 2011-05-10 ENCOUNTER — Encounter: Payer: Self-pay | Admitting: Vascular Surgery

## 2011-05-10 ENCOUNTER — Telehealth: Payer: Self-pay | Admitting: Cardiovascular Disease

## 2011-05-10 ENCOUNTER — Encounter (HOSPITAL_COMMUNITY): Payer: Self-pay | Admitting: Respiratory Therapy

## 2011-05-10 ENCOUNTER — Ambulatory Visit (INDEPENDENT_AMBULATORY_CARE_PROVIDER_SITE_OTHER): Payer: Medicare Other | Admitting: Vascular Surgery

## 2011-05-10 ENCOUNTER — Other Ambulatory Visit: Payer: Self-pay | Admitting: *Deleted

## 2011-05-10 VITALS — BP 112/84 | HR 80 | Resp 16 | Ht 72.0 in | Wt 164.0 lb

## 2011-05-10 DIAGNOSIS — I714 Abdominal aortic aneurysm, without rupture: Secondary | ICD-10-CM

## 2011-05-10 NOTE — Telephone Encounter (Signed)
New problem:    Per Glenn Barron calling, patient having upcoming surgery - Popliteal aneurysm .coumadin will stop x 3 days Need  to bridge Lovenox .

## 2011-05-10 NOTE — Progress Notes (Signed)
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 He has seen Dr. Kriste Basque and Dr Eden Emms is my last visit with him. He has been cleared from cardiac standpoint for repair. He is here today with his daughter. I did have a long discussion regarding his diffuse aneurysmal formation in his bilateral hypogastric bilateral common femoral and bilateral popliteal arteries. All these are stable except for the very large aneurysm in his left distal superficial femoral and popliteal. CT scan there revealed maximal diameter and nearly 9 cm. He is symptomatic with pain from this making it more difficult for him to walk.  Past Medical History  Diagnosis Date  . Allergic rhinitis, cause unspecified   . Unspecified chronic bronchitis   . Other pulmonary embolism and infarction   . Hypertension   . Paroxysmal a-fib   . Cerebrovascular disease, unspecified   . Peripheral vascular disease, unspecified   . Unspecified venous (peripheral) insufficiency   . Acute venous embolism and thrombosis of unspecified deep vessels of lower extremity   . Hyperlipidemia   . GERD (gastroesophageal reflux disease)   . Diverticulosis of colon (without mention of hemorrhage)   . Irritable bowel syndrome   . Benign neoplasm of colon   . Malignant neoplasm of prostate   . Osteoarthrosis, unspecified whether generalized or localized, unspecified site   . Unspecified cerebral artery occlusion with cerebral infarction   . Unspecified hereditary and idiopathic peripheral neuropathy   . Depression     History  Substance Use Topics  . Smoking status: Never Smoker   . Smokeless tobacco: Never Used  . Alcohol Use: No    No family history on file.  Allergies  Allergen Reactions  . Amoxicillin     REACTION: rash  . Doxycycline     Rash    Current outpatient prescriptions:acetaminophen (TYLENOL) 650 MG CR tablet, Take 650 mg by mouth 2 (two)  times daily., Disp: , Rfl: ;  albuterol (PROAIR HFA) 108 (90 BASE) MCG/ACT inhaler, Inhale 1-2 puffs into the lungs every 6 (six) hours as needed., Disp: 1 Inhaler, Rfl: 11;  aspirin 81 MG tablet, Take 81 mg by mouth daily.  , Disp: , Rfl: ;  atorvastatin (LIPITOR) 20 MG tablet, take 1 tablet by mouth once daily, Disp: 30 tablet, Rfl: 5 calcium carbonate (TUMS - DOSED IN MG ELEMENTAL CALCIUM) 500 MG chewable tablet, As needed , Disp: , Rfl: ;  cholecalciferol (VITAMIN D) 1000 UNITS tablet, Take 1,000 Units by mouth daily.  , Disp: , Rfl: ;  dextromethorphan-guaiFENesin (MUCINEX DM) 30-600 MG per 12 hr tablet, Take 1 tablet by mouth as needed. , Disp: , Rfl: ;  famotidine (PEPCID) 10 MG tablet, Take 10 mg by mouth at bedtime.  , Disp: , Rfl:  Fluticasone Propionate (FLONASE NA), Place 30-600 mg into the nose as needed., Disp: , Rfl: ;  Fluticasone-Salmeterol (ADVAIR DISKUS) 250-50 MCG/DOSE AEPB, Inhale 1 puff into the lungs 2 (two) times daily. Rinse mouth well, Disp: 60 each, Rfl: 11;  furosemide (LASIX) 40 MG tablet, Take 2 tablets by mouth in the morning ., Disp: , Rfl:  oxyCODONE (OXY IR/ROXICODONE) 5 MG immediate release tablet, Take 1 tab every 4-6 hours as needed pain as needed only per pt , Disp: , Rfl: ;  potassium chloride SA (K-DUR,KLOR-CON) 20 MEQ tablet, Take 20 mEq by mouth daily., Disp: , Rfl: ;  predniSONE (DELTASONE) 10 MG tablet, Take 10 mg by mouth  every other day. Take 1/2 tablet every other day., Disp: , Rfl: ;  PROBIOTIC CAPS, Take 1 cap daily , Disp: , Rfl:  sennosides-docusate sodium (SENOKOT-S) 8.6-50 MG tablet, Use as needed for constipation , Disp: , Rfl: ;  warfarin (COUMADIN) 4 MG tablet, TAKE AS DIRECTED BY COUMADIN CLINIC, Disp: 100 tablet, Rfl: 2;  fluticasone (FLONASE) 50 MCG/ACT nasal spray, instill 2 sprays into each nostril once daily, Disp: 16 g, Rfl: 11;  predniSONE (DELTASONE) 10 MG tablet, take 1/2 tablet by mouth once daily, Disp: 30 tablet, Rfl: 3  BP 112/84  Pulse 80   Resp 16  Ht 6' (1.829 m)  Wt 164 lb (74.39 kg)  BMI 22.24 kg/m2  Body mass index is 22.24 kg/(m^2).       Review of systems: No change  Physical exam: Well-developed alert oriented white male in no acute distress Heart regular rate and rhythm Chest clear bilateral Extremities with a very large expansile pulsatile mass encompassing his entire distal left thigh to the popliteal space Both feet are very cyanotic which is chronic for the patient. He does not have palpable distal pulses.  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 I have recommended stent graft repair. I have recommended just repairing his multiple other aneurysm since her size is been stable. I explained they'll do have a potential for rupture but certainly would recommend against repairing multiple different aneurysms in this debilitated gentleman. He understands the procedure with hopefully a 1-2 night hospitalization and rapid return to his normal baseline. He has indicated that Dr.Nishan feels it is okay to stop his Coumadin for several days prior to surgery and then resuming it. We will confirm this with his office. Surgery scheduled for April 12 at [facility]

## 2011-05-10 NOTE — Telephone Encounter (Signed)
Spoke with Glenn Barron.  Relayed information from Dr. Fabio Bering office note on 3/20.  Okay to hold Coumadin prior to procedure but would prefer Lovenox bridge after procedure due to history of post-op DVT/PE.

## 2011-05-16 NOTE — Pre-Procedure Instructions (Signed)
20 Glenn Barron [facility]  05/16/2011   Your procedure is scheduled on:  Friday May 20, 2011  Report to [facility] at 0830 AM.  Call this number if you have problems the morning of surgery: [phone]   Remember:   Do not eat food:After Midnight.  May have clear liquids: up to 4 Hours before arrival. (4:30am)  Clear liquids include soda, tea, black coffee, apple or grape juice, broth.  Take these medicines the morning of surgery with A SIP OF WATER: albuterol,pepcid, flonase, advair, prednisone   .  STOP Probiotic and Vitamin D today  Do not wear jewelry, make-up or nail polish.  Do not wear lotions, powders, or perfumes. You may wear deodorant.  Do not shave 48 hours prior to surgery.  Do not bring valuables to the hospital.  Contacts, dentures or bridgework may not be worn into surgery.  Leave suitcase in the car. After surgery it may be brought to your room.  For patients admitted to the hospital, checkout time is 11:00 AM the day of discharge.     Special Instructions: CHG Shower Use Special Wash: 1/2 bottle night before surgery and 1/2 bottle morning of surgery.   Please read over the following fact sheets that you were given: Pain Booklet, Coughing and Deep Breathing, Blood Transfusion Information, MRSA Information and Surgical Site Infection Prevention

## 2011-05-17 ENCOUNTER — Encounter (HOSPITAL_COMMUNITY): Payer: Self-pay

## 2011-05-17 ENCOUNTER — Encounter (HOSPITAL_COMMUNITY)
Admission: RE | Admit: 2011-05-17 | Discharge: 2011-05-17 | Disposition: A | Discharge status: Home / Self Care | Payer: Medicare Other | Source: Ambulatory Visit | Attending: Anesthesiology | Admitting: Anesthesiology

## 2011-05-17 ENCOUNTER — Encounter (HOSPITAL_COMMUNITY)
Admission: RE | Admit: 2011-05-17 | Discharge: 2011-05-17 | Disposition: A | Discharge status: Home / Self Care | Payer: Medicare Other | Source: Ambulatory Visit | Attending: Vascular Surgery | Admitting: Vascular Surgery

## 2011-05-17 HISTORY — DX: Unspecified right bundle-branch block: I45.10

## 2011-05-17 HISTORY — DX: Personal history of other infectious and parasitic diseases: Z86.19

## 2011-05-17 LAB — URINALYSIS, ROUTINE W REFLEX MICROSCOPIC
Bilirubin Urine: NEGATIVE
Hgb urine dipstick: NEGATIVE
Ketones, ur: NEGATIVE mg/dL
Specific Gravity, Urine: 1.019 (ref 1.005–1.030)
pH: 6 (ref 5.0–8.0)

## 2011-05-17 LAB — BLOOD GAS, ARTERIAL
Acid-Base Excess: 1.9 mmol/L (ref 0.0–2.0)
Bicarbonate: 25.6 mEq/L — ABNORMAL HIGH (ref 20.0–24.0)
TCO2: 26.8 mmol/L (ref 0–100)
pCO2 arterial: 37.6 mmHg (ref 35.0–45.0)
pH, Arterial: 7.448 (ref 7.350–7.450)
pO2, Arterial: 82.3 mmHg (ref 80.0–100.0)

## 2011-05-17 LAB — URINE MICROSCOPIC-ADD ON

## 2011-05-17 LAB — CBC
HCT: 47.2 % (ref 39.0–52.0)
Hemoglobin: 16.3 g/dL (ref 13.0–17.0)
MCV: 94.4 fL (ref 78.0–100.0)
RBC: 5 MIL/uL (ref 4.22–5.81)
WBC: 10.8 10*3/uL — ABNORMAL HIGH (ref 4.0–10.5)

## 2011-05-17 LAB — SURGICAL PCR SCREEN: Staphylococcus aureus: NEGATIVE

## 2011-05-17 LAB — COMPREHENSIVE METABOLIC PANEL
Albumin: 3.3 g/dL — ABNORMAL LOW (ref 3.5–5.2)
Alkaline Phosphatase: 142 U/L — ABNORMAL HIGH (ref 39–117)
BUN: 24 mg/dL — ABNORMAL HIGH (ref 6–23)
CO2: 21 mEq/L (ref 19–32)
Chloride: 102 mEq/L (ref 96–112)
Creatinine, Ser: 1 mg/dL (ref 0.50–1.35)
GFR calc non Af Amer: 66 mL/min — ABNORMAL LOW (ref >90)
Potassium: 4.1 mEq/L (ref 3.5–5.1)
Total Bilirubin: 0.8 mg/dL (ref 0.3–1.2)

## 2011-05-17 LAB — DIFFERENTIAL
Basophils Absolute: 0 10*3/uL (ref 0.0–0.1)
Basophils Relative: 0 % (ref 0–1)
Eosinophils Absolute: 0.1 10*3/uL (ref 0.0–0.7)
Monocytes Absolute: 1.4 10*3/uL — ABNORMAL HIGH (ref 0.1–1.0)
Monocytes Relative: 13 % — ABNORMAL HIGH (ref 3–12)
Neutro Abs: 5.5 10*3/uL (ref 1.7–7.7)
Neutrophils Relative %: 51 % (ref 43–77)

## 2011-05-17 LAB — PROTIME-INR: INR: 2.62 — ABNORMAL HIGH (ref 0.00–1.49)

## 2011-05-17 LAB — TYPE AND SCREEN

## 2011-05-17 LAB — ABO/RH: ABO/RH(D): A POS

## 2011-05-17 NOTE — Progress Notes (Signed)
Will request anesthesia review chart d/t cardiac history. Repeat PT/ INR DOS

## 2011-05-18 ENCOUNTER — Encounter (HOSPITAL_COMMUNITY): Payer: Self-pay | Admitting: Vascular Surgery

## 2011-05-18 NOTE — Consult Note (Signed)
Anesthesia:  Patient is a 78 year old male scheduled for a left popliteal artery aneurysm repair on 05/20/11. History includes non-smoker, afib, CVA, history of post-operative DVT/PE s/p IVC filter and Coumadin, PVD, prostate CA s/p prostatectomy, depression, HLD, HTN, asthma, peripheral neuropathy, right BBB, mild AS by 04/2011 echo, history of Scarlet fever, history of AAA repair '00 and right CEA, skin cancer.  Cardiology notes mention a history of SSS and Wenkebach on prior EKGs, but patient did not want a PPM and Dr. Eden Emms felt currently there was no clear indication for one.  PCP is Dr. Kriste Basque.  His Cardiologist is Dr. Eden Emms.  He was seen for follow-up and preoperative evaluation on 04/27/11.  EKG then showed afib, right BBB, possible septal infarct (age undetermined). He ordered and echo and ultimately cleared him for this procedure.  Echo on 04/27/11 showed: - Left ventricle: The cavity size was normal. Wall thickness was increased in a pattern of moderate LVH. Systolic function was normal. The estimated ejection fraction was in the range of 55% to 60%. - Aortic valve: There was mild stenosis. - Mitral valve: Mild regurgitation. - Left atrium: The atrium was moderately dilated. - Atrial septum: No defect or patent foramen ovale was identified.  CXR on 05/17/11 showed: No pulmonary edema, pneumonia, or pleural effusion is evident. Hyperinflation configuration consistent with COPD. Chronic basilar volume loss with fibrotic changes are detailed above. Scoliosis. In the area of the aorticopulmonary window on the PA examination  there is a convex bulging density laterally to the left. This  could be related to the proximal descending thoracic aorta and slight ectasia but I cannot exclude aneurysmal dilatation or lymph node or mass in this area. If it is related to the aorta the contour has changed from prior chest examinations. Of course considering the patient's scoliosis this area could be  projecting differently than on previous studies. CT of the chest could be performed for additional characterization and evaluation.  Labs noted.  PLTs clumped.  Previously they were 139 on 05/06/11.  PT/INR 28.4/2.62.  PTT 27.    Oswaldo Done, RN at [facility] was notified of the CXR and PLT findings and plan to recheck CBC and PT/INR on the day of surgery.

## 2011-05-19 MED ORDER — VANCOMYCIN HCL IN DEXTROSE 1-5 GM/200ML-% IV SOLN
1000.0000 mg | INTRAVENOUS | Status: AC
  Administered 2011-05-20: 1000 mg via INTRAVENOUS
  Filled 2011-05-19: qty 200

## 2011-05-20 ENCOUNTER — Inpatient Hospital Stay (HOSPITAL_COMMUNITY)
Admission: RE | Admit: 2011-05-20 | Discharge: 2011-05-21 | DRG: 238 | Disposition: A | Discharge status: Home / Self Care | Payer: Medicare Other | Source: Ambulatory Visit | Attending: Vascular Surgery | Admitting: Vascular Surgery

## 2011-05-20 ENCOUNTER — Encounter (HOSPITAL_COMMUNITY): Payer: Self-pay | Admitting: Vascular Surgery

## 2011-05-20 ENCOUNTER — Ambulatory Visit (HOSPITAL_COMMUNITY): Payer: Medicare Other

## 2011-05-20 ENCOUNTER — Encounter (HOSPITAL_COMMUNITY)
Admission: RE | Disposition: A | Discharge status: Home / Self Care | Payer: Self-pay | Source: Ambulatory Visit | Attending: Vascular Surgery

## 2011-05-20 ENCOUNTER — Ambulatory Visit (HOSPITAL_COMMUNITY): Payer: Medicare Other | Admitting: Vascular Surgery

## 2011-05-20 ENCOUNTER — Telehealth: Payer: Self-pay

## 2011-05-20 ENCOUNTER — Other Ambulatory Visit: Payer: Self-pay | Admitting: *Deleted

## 2011-05-20 ENCOUNTER — Encounter (HOSPITAL_COMMUNITY): Payer: Self-pay | Admitting: Certified Registered Nurse Anesthetist

## 2011-05-20 DIAGNOSIS — Z8546 Personal history of malignant neoplasm of prostate: Secondary | ICD-10-CM

## 2011-05-20 DIAGNOSIS — Z86718 Personal history of other venous thrombosis and embolism: Secondary | ICD-10-CM

## 2011-05-20 DIAGNOSIS — I509 Heart failure, unspecified: Secondary | ICD-10-CM | POA: Diagnosis present

## 2011-05-20 DIAGNOSIS — E785 Hyperlipidemia, unspecified: Secondary | ICD-10-CM | POA: Diagnosis present

## 2011-05-20 DIAGNOSIS — I4891 Unspecified atrial fibrillation: Secondary | ICD-10-CM | POA: Diagnosis present

## 2011-05-20 DIAGNOSIS — Z01818 Encounter for other preprocedural examination: Secondary | ICD-10-CM

## 2011-05-20 DIAGNOSIS — Z8673 Personal history of transient ischemic attack (TIA), and cerebral infarction without residual deficits: Secondary | ICD-10-CM

## 2011-05-20 DIAGNOSIS — Z79899 Other long term (current) drug therapy: Secondary | ICD-10-CM

## 2011-05-20 DIAGNOSIS — G609 Hereditary and idiopathic neuropathy, unspecified: Secondary | ICD-10-CM | POA: Diagnosis present

## 2011-05-20 DIAGNOSIS — K219 Gastro-esophageal reflux disease without esophagitis: Secondary | ICD-10-CM | POA: Diagnosis present

## 2011-05-20 DIAGNOSIS — F341 Dysthymic disorder: Secondary | ICD-10-CM | POA: Diagnosis present

## 2011-05-20 DIAGNOSIS — I724 Aneurysm of artery of lower extremity: Secondary | ICD-10-CM

## 2011-05-20 DIAGNOSIS — I70219 Atherosclerosis of native arteries of extremities with intermittent claudication, unspecified extremity: Secondary | ICD-10-CM

## 2011-05-20 DIAGNOSIS — Z7901 Long term (current) use of anticoagulants: Secondary | ICD-10-CM

## 2011-05-20 DIAGNOSIS — I872 Venous insufficiency (chronic) (peripheral): Secondary | ICD-10-CM | POA: Diagnosis present

## 2011-05-20 DIAGNOSIS — I739 Peripheral vascular disease, unspecified: Secondary | ICD-10-CM | POA: Diagnosis present

## 2011-05-20 DIAGNOSIS — K589 Irritable bowel syndrome without diarrhea: Secondary | ICD-10-CM | POA: Diagnosis present

## 2011-05-20 DIAGNOSIS — Z86711 Personal history of pulmonary embolism: Secondary | ICD-10-CM

## 2011-05-20 DIAGNOSIS — I10 Essential (primary) hypertension: Secondary | ICD-10-CM | POA: Diagnosis present

## 2011-05-20 DIAGNOSIS — M1990 Unspecified osteoarthritis, unspecified site: Secondary | ICD-10-CM | POA: Diagnosis present

## 2011-05-20 DIAGNOSIS — IMO0002 Reserved for concepts with insufficient information to code with codable children: Secondary | ICD-10-CM

## 2011-05-20 DIAGNOSIS — Z01812 Encounter for preprocedural laboratory examination: Secondary | ICD-10-CM

## 2011-05-20 LAB — PROTIME-INR: INR: 1.39 (ref 0.00–1.49)

## 2011-05-20 LAB — CBC
HCT: 48 % (ref 39.0–52.0)
Hemoglobin: 16.1 g/dL (ref 13.0–17.0)
RBC: 4.96 MIL/uL (ref 4.22–5.81)
WBC: 9.3 10*3/uL (ref 4.0–10.5)

## 2011-05-20 SURGERY — CREATION, BYPASS, ARTERIAL, POPLITEAL
Anesthesia: Monitor Anesthesia Care | Site: Leg Lower | Laterality: Left | Wound class: Clean

## 2011-05-20 MED ORDER — VITAMIN D3 25 MCG (1000 UNIT) PO TABS
1000.0000 [IU] | ORAL_TABLET | Freq: Every day | ORAL | Status: DC
  Administered 2011-05-21: 1000 [IU] via ORAL
  Filled 2011-05-20: qty 1

## 2011-05-20 MED ORDER — PANTOPRAZOLE SODIUM 40 MG PO TBEC: 40.0000 mg | DELAYED_RELEASE_TABLET | Freq: Every day | ORAL | Status: DC

## 2011-05-20 MED ORDER — SODIUM CHLORIDE 0.9 % IR SOLN
Status: DC | PRN
  Administered 2011-05-20 (×2)

## 2011-05-20 MED ORDER — ONDANSETRON HCL 4 MG/2ML IJ SOLN: 4.0000 mg | Freq: Four times a day (QID) | INTRAMUSCULAR | Status: DC | PRN

## 2011-05-20 MED ORDER — 0.9 % SODIUM CHLORIDE (POUR BTL) OPTIME
TOPICAL | Status: DC | PRN
  Administered 2011-05-20: 1000 mL

## 2011-05-20 MED ORDER — SIMVASTATIN 20 MG PO TABS
20.0000 mg | ORAL_TABLET | Freq: Every day | ORAL | Status: DC
  Administered 2011-05-21: 20 mg via ORAL
  Filled 2011-05-20: qty 1

## 2011-05-20 MED ORDER — LACTATED RINGERS IV SOLN
INTRAVENOUS | Status: DC
  Administered 2011-05-20: 11:00:00 via INTRAVENOUS

## 2011-05-20 MED ORDER — LIDOCAINE-EPINEPHRINE 0.5-1:200000 % IJ SOLN
INTRAMUSCULAR | Status: DC | PRN
  Administered 2011-05-20: 10 mL

## 2011-05-20 MED ORDER — LACTATED RINGERS IV SOLN
INTRAVENOUS | Status: DC | PRN
  Administered 2011-05-20 (×2): via INTRAVENOUS

## 2011-05-20 MED ORDER — PROBIOTIC PO CAPS: 1.0000 | ORAL_CAPSULE | Freq: Every day | ORAL | Status: DC

## 2011-05-20 MED ORDER — FLUTICASONE PROPIONATE 50 MCG/ACT NA SUSP
1.0000 | Freq: Every day | NASAL | Status: DC
  Administered 2011-05-21: 1 via NASAL
  Filled 2011-05-20: qty 16

## 2011-05-20 MED ORDER — ACETAMINOPHEN 650 MG RE SUPP: 325.0000 mg | RECTAL | Status: DC | PRN

## 2011-05-20 MED ORDER — POLYETHYLENE GLYCOL 3350 17 G PO PACK
17.0000 g | PACK | ORAL | Status: DC | PRN
  Filled 2011-05-20: qty 1

## 2011-05-20 MED ORDER — ACETAMINOPHEN ER 650 MG PO TBCR: 650.0000 mg | EXTENDED_RELEASE_TABLET | Freq: Two times a day (BID) | ORAL | Status: DC

## 2011-05-20 MED ORDER — HEPARIN SODIUM (PORCINE) 1000 UNIT/ML IJ SOLN
INTRAMUSCULAR | Status: DC | PRN
  Administered 2011-05-20: 5000 [IU] via INTRAVENOUS

## 2011-05-20 MED ORDER — VANCOMYCIN HCL IN DEXTROSE 1-5 GM/200ML-% IV SOLN
1000.0000 mg | Freq: Two times a day (BID) | INTRAVENOUS | Status: DC
  Administered 2011-05-20: 1000 mg via INTRAVENOUS
  Filled 2011-05-20 (×2): qty 200

## 2011-05-20 MED ORDER — PROPOFOL 10 MG/ML IV EMUL
INTRAVENOUS | Status: DC | PRN
  Administered 2011-05-20: 20 mg via INTRAVENOUS
  Administered 2011-05-20: 30 mg via INTRAVENOUS

## 2011-05-20 MED ORDER — PHENOL 1.4 % MT LIQD: 1.0000 | OROMUCOSAL | Status: DC | PRN

## 2011-05-20 MED ORDER — OXYCODONE HCL 5 MG PO TABS: 5.0000 mg | ORAL_TABLET | ORAL | Status: DC | PRN

## 2011-05-20 MED ORDER — FENTANYL CITRATE 0.05 MG/ML IJ SOLN: 25.0000 ug | INTRAMUSCULAR | Status: DC | PRN

## 2011-05-20 MED ORDER — VANCOMYCIN HCL 1000 MG IV SOLR
750.0000 mg | Freq: Once | INTRAVENOUS | Status: AC
  Administered 2011-05-21: 750 mg via INTRAVENOUS
  Filled 2011-05-20: qty 750

## 2011-05-20 MED ORDER — FENTANYL CITRATE 0.05 MG/ML IJ SOLN
INTRAMUSCULAR | Status: DC | PRN
  Administered 2011-05-20 (×2): 25 ug via INTRAVENOUS
  Administered 2011-05-20 (×2): 50 ug via INTRAVENOUS

## 2011-05-20 MED ORDER — ASPIRIN EC 81 MG PO TBEC
81.0000 mg | DELAYED_RELEASE_TABLET | Freq: Every day | ORAL | Status: DC
  Administered 2011-05-21: 81 mg via ORAL
  Filled 2011-05-20: qty 1

## 2011-05-20 MED ORDER — FAMOTIDINE 10 MG PO TABS
10.0000 mg | ORAL_TABLET | Freq: Every day | ORAL | Status: DC
  Administered 2011-05-20: 10 mg via ORAL
  Filled 2011-05-20 (×2): qty 1

## 2011-05-20 MED ORDER — POTASSIUM CHLORIDE CRYS ER 20 MEQ PO TBCR
20.0000 meq | EXTENDED_RELEASE_TABLET | Freq: Every day | ORAL | Status: DC
  Administered 2011-05-21: 20 meq via ORAL
  Filled 2011-05-20: qty 1

## 2011-05-20 MED ORDER — ASPIRIN 81 MG PO TABS: 81.0000 mg | ORAL_TABLET | Freq: Every day | ORAL | Status: DC

## 2011-05-20 MED ORDER — GUAIFENESIN-DM 100-10 MG/5ML PO SYRP: 15.0000 mL | ORAL_SOLUTION | ORAL | Status: DC | PRN

## 2011-05-20 MED ORDER — ALBUTEROL SULFATE HFA 108 (90 BASE) MCG/ACT IN AERS
1.0000 | INHALATION_SPRAY | Freq: Four times a day (QID) | RESPIRATORY_TRACT | Status: DC | PRN
  Filled 2011-05-20: qty 6.7

## 2011-05-20 MED ORDER — FUROSEMIDE 40 MG PO TABS
40.0000 mg | ORAL_TABLET | Freq: Every day | ORAL | Status: DC
  Filled 2011-05-20: qty 1

## 2011-05-20 MED ORDER — SODIUM CHLORIDE 0.9 % IV SOLN: 500.0000 mL | Freq: Once | INTRAVENOUS | Status: AC | PRN

## 2011-05-20 MED ORDER — FLUTICASONE-SALMETEROL 250-50 MCG/DOSE IN AEPB
1.0000 | INHALATION_SPRAY | Freq: Two times a day (BID) | RESPIRATORY_TRACT | Status: DC
  Administered 2011-05-20 – 2011-05-21 (×2): 1 via RESPIRATORY_TRACT
  Filled 2011-05-20: qty 14

## 2011-05-20 MED ORDER — OXYCODONE HCL 5 MG PO CAPS: 5.0000 mg | ORAL_CAPSULE | Freq: Four times a day (QID) | ORAL | Status: AC | PRN

## 2011-05-20 MED ORDER — HYDRALAZINE HCL 20 MG/ML IJ SOLN: 10.0000 mg | INTRAMUSCULAR | Status: DC | PRN

## 2011-05-20 MED ORDER — PROTAMINE SULFATE 10 MG/ML IV SOLN
INTRAVENOUS | Status: DC | PRN
  Administered 2011-05-20: 20 mg via INTRAVENOUS
  Administered 2011-05-20 (×3): 10 mg via INTRAVENOUS

## 2011-05-20 MED ORDER — IODIXANOL 320 MG/ML IV SOLN
INTRAVENOUS | Status: DC | PRN
  Administered 2011-05-20: 65 mL via INTRA_ARTERIAL

## 2011-05-20 MED ORDER — PHENYLEPHRINE HCL 10 MG/ML IJ SOLN
INTRAMUSCULAR | Status: DC | PRN
  Administered 2011-05-20: 40 ug via INTRAVENOUS

## 2011-05-20 MED ORDER — KCL IN DEXTROSE-NACL 20-5-0.9 MEQ/L-%-% IV SOLN
INTRAVENOUS | Status: DC
  Administered 2011-05-20: 17:00:00 via INTRAVENOUS
  Filled 2011-05-20 (×5): qty 1000

## 2011-05-20 MED ORDER — MORPHINE SULFATE 2 MG/ML IJ SOLN: 2.0000 mg | INTRAMUSCULAR | Status: DC | PRN

## 2011-05-20 MED ORDER — DOPAMINE-DEXTROSE 3.2-5 MG/ML-% IV SOLN: 3.0000 ug/kg/min | INTRAVENOUS | Status: DC

## 2011-05-20 MED ORDER — METOPROLOL TARTRATE 1 MG/ML IV SOLN: 2.0000 mg | INTRAVENOUS | Status: DC | PRN

## 2011-05-20 MED ORDER — ACETAMINOPHEN 325 MG PO TABS: 325.0000 mg | ORAL_TABLET | ORAL | Status: DC | PRN

## 2011-05-20 MED ORDER — WARFARIN SODIUM 4 MG PO TABS: 4.0000 mg | ORAL_TABLET | Freq: Every day | ORAL | Status: DC

## 2011-05-20 MED ORDER — TEMAZEPAM 15 MG PO CAPS
15.0000 mg | ORAL_CAPSULE | Freq: Every evening | ORAL | Status: DC | PRN
  Administered 2011-05-20: 15 mg via ORAL
  Filled 2011-05-20: qty 1

## 2011-05-20 MED ORDER — LABETALOL HCL 5 MG/ML IV SOLN: 10.0000 mg | INTRAVENOUS | Status: DC | PRN

## 2011-05-20 MED ORDER — PREDNISONE 5 MG PO TABS: 5.0000 mg | ORAL_TABLET | ORAL | Status: DC

## 2011-05-20 MED ORDER — SODIUM CHLORIDE 0.9 % IV SOLN: INTRAVENOUS | Status: DC

## 2011-05-20 MED ORDER — POTASSIUM CHLORIDE CRYS ER 20 MEQ PO TBCR: 20.0000 meq | EXTENDED_RELEASE_TABLET | Freq: Once | ORAL | Status: AC | PRN

## 2011-05-20 MED ORDER — PROPOFOL 10 MG/ML IV EMUL
INTRAVENOUS | Status: DC | PRN
  Administered 2011-05-20: 140 ug/kg/min via INTRAVENOUS
  Administered 2011-05-20: 50 ug/kg/min via INTRAVENOUS

## 2011-05-20 MED ORDER — DOCUSATE SODIUM 100 MG PO CAPS
100.0000 mg | ORAL_CAPSULE | Freq: Every day | ORAL | Status: DC
  Administered 2011-05-21: 100 mg via ORAL
  Filled 2011-05-20: qty 1

## 2011-05-20 SURGICAL SUPPLY — 70 items
BAG SNAP BAND KOVER 36X36 (MISCELLANEOUS) ×2 IMPLANT
BALLN POWERFLEX 10X7X80 (BALLOONS) ×2 IMPLANT
BANDAGE ESMARK 6X9 LF (GAUZE/BANDAGES/DRESSINGS) IMPLANT
BENZOIN TINCTURE PRP APPL 2/3 (GAUZE/BANDAGES/DRESSINGS) ×2 IMPLANT
BNDG ESMARK 6X9 LF (GAUZE/BANDAGES/DRESSINGS)
CANISTER SUCTION 2500CC (MISCELLANEOUS) ×2 IMPLANT
CANNULA VESSEL W/WING WO/VALVE (CANNULA) ×2 IMPLANT
CATH BEACON 5.038 65CM KMP-01 (CATHETERS) ×2 IMPLANT
CLIP FOGARTY SPRING 6M (CLIP) IMPLANT
CLIP LIGATING EXTRA MED SLVR (CLIP) ×2 IMPLANT
CLIP LIGATING EXTRA SM BLUE (MISCELLANEOUS) ×2 IMPLANT
CLOTH BEACON ORANGE TIMEOUT ST (SAFETY) ×2 IMPLANT
CLSR STERI-STRIP ANTIMIC 1/2X4 (GAUZE/BANDAGES/DRESSINGS) ×2 IMPLANT
COVER SURGICAL LIGHT HANDLE (MISCELLANEOUS) ×4 IMPLANT
CUFF TOURNIQUET SINGLE 34IN LL (TOURNIQUET CUFF) IMPLANT
CUFF TOURNIQUET SINGLE 44IN (TOURNIQUET CUFF) IMPLANT
DRAIN SNY 10X20 3/4 PERF (WOUND CARE) IMPLANT
DRAPE WARM FLUID 44X44 (DRAPE) ×2 IMPLANT
DRAPE X-RAY CASS 24X20 (DRAPES) IMPLANT
DRSG COVADERM 4X10 (GAUZE/BANDAGES/DRESSINGS) IMPLANT
DRSG COVADERM 4X8 (GAUZE/BANDAGES/DRESSINGS) IMPLANT
ELECT REM PT RETURN 9FT ADLT (ELECTROSURGICAL) ×2
ELECTRODE REM PT RTRN 9FT ADLT (ELECTROSURGICAL) ×1 IMPLANT
EVACUATOR SILICONE 100CC (DRAIN) IMPLANT
GLOVE BIO SURGEON STRL SZ 6.5 (GLOVE) ×4 IMPLANT
GLOVE BIOGEL PI IND STRL 6.5 (GLOVE) ×4 IMPLANT
GLOVE BIOGEL PI INDICATOR 6.5 (GLOVE) ×4
GLOVE ECLIPSE 6.5 STRL STRAW (GLOVE) ×2 IMPLANT
GLOVE SS BIOGEL STRL SZ 7.5 (GLOVE) ×1 IMPLANT
GLOVE SUPERSENSE BIOGEL SZ 7.5 (GLOVE) ×1
GOWN STRL NON-REIN LRG LVL3 (GOWN DISPOSABLE) ×6 IMPLANT
INSERT FOGARTY SM (MISCELLANEOUS) IMPLANT
KIT BASIN OR (CUSTOM PROCEDURE TRAY) ×2 IMPLANT
KIT ROOM TURNOVER OR (KITS) ×2 IMPLANT
NEEDLE 22X1 1/2 (OR ONLY) (NEEDLE) ×2 IMPLANT
NEEDLE PERC 18GX7CM (NEEDLE) ×4 IMPLANT
NS IRRIG 1000ML POUR BTL (IV SOLUTION) ×4 IMPLANT
PACK PERIPHERAL VASCULAR (CUSTOM PROCEDURE TRAY) ×2 IMPLANT
PAD ARMBOARD 7.5X6 YLW CONV (MISCELLANEOUS) ×4 IMPLANT
PADDING CAST COTTON 6X4 STRL (CAST SUPPLIES) IMPLANT
SET COLLECT BLD 21X3/4 12 (NEEDLE) IMPLANT
SHEATH BRITE TIP 7FRX11 (SHEATH) ×2 IMPLANT
SHEATH DRYSEAL GORE 12FRX28 (SHEATH) ×2 IMPLANT
SPONGE GAUZE 4X4 12PLY (GAUZE/BANDAGES/DRESSINGS) ×2 IMPLANT
STAPLER VISISTAT 35W (STAPLE) IMPLANT
STOPCOCK 4 WAY LG BORE MALE ST (IV SETS) IMPLANT
STOPCOCK MORSE 400PSI 3WAY (MISCELLANEOUS) ×2 IMPLANT
STRIP CLOSURE SKIN 1/2X4 (GAUZE/BANDAGES/DRESSINGS) ×2 IMPLANT
SUT ETHILON 3 0 PS 1 (SUTURE) IMPLANT
SUT PROLENE 5 0 C 1 24 (SUTURE) ×2 IMPLANT
SUT PROLENE 6 0 CC (SUTURE) ×2 IMPLANT
SUT SILK 2 0 SH (SUTURE) ×2 IMPLANT
SUT VIC AB 2-0 CTX 36 (SUTURE) ×4 IMPLANT
SUT VIC AB 3-0 SH 27 (SUTURE) ×2
SUT VIC AB 3-0 SH 27X BRD (SUTURE) ×2 IMPLANT
SYR MEDRAD MARK V 150ML (SYRINGE) ×2 IMPLANT
SYRINGE 10CC LL (SYRINGE) ×8 IMPLANT
TAPE CLOTH SURG 4X10 WHT LF (GAUZE/BANDAGES/DRESSINGS) ×2 IMPLANT
TOWEL OR 17X24 6PK STRL BLUE (TOWEL DISPOSABLE) ×4 IMPLANT
TOWEL OR 17X26 10 PK STRL BLUE (TOWEL DISPOSABLE) ×4 IMPLANT
TRAY FOLEY CATH 14FRSI W/METER (CATHETERS) IMPLANT
TUBING EXTENTION W/L.L. (IV SETS) IMPLANT
TUBING HIGH PRESSURE 120CM (CONNECTOR) ×2 IMPLANT
UNDERPAD 30X30 INCONTINENT (UNDERPADS AND DIAPERS) ×2 IMPLANT
VIABAHN ENDOPROSTHESIS ×2 IMPLANT
VIABAHN ENDOPROSTHESIS (Stent) ×2 IMPLANT
Viabahn Endoprosthesis (Stent) ×2 IMPLANT
WATER STERILE IRR 1000ML POUR (IV SOLUTION) ×2 IMPLANT
WIRE AMPLATZ SS-J .035X260CM (WIRE) ×2 IMPLANT
WIRE BENTSON .035X145CM (WIRE) ×2 IMPLANT

## 2011-05-20 NOTE — Telephone Encounter (Signed)
Message copied by Migdalia Dk on Fri May 20, 2011  9:00 AM ------      Message from: Wendall Stade      Created: Fri May 20, 2011  7:58 AM       Ok to run INR 2-2.5      ----- Message -----         From: Luan Moore, RN         Sent: 05/19/2011   5:51 PM           To: Wendall Stade, MD, Luan Moore, RN            Please advise on your recommendations.  Thanks.      ----- Message -----         From: Marcellus Scott, CMA         Sent: 05/04/2011   9:20 AM           To: Luan Moore, RN            Dr. Heber [place] are to have the range 2- 2.5 due to the pts hx of aneurysm.  Dr. Kriste Basque said you may want to check with Dr. Eden Emms on this as well.  thanks      ----- Message -----         From: Luan Moore, RN         Sent: 05/04/2011   9:13 AM           To: Marcellus Scott, CMA            Hey there Marliss Czar, Mr Sanluis's INR was 2.9, we have his range as 2-3.  Does Dr Kriste Basque want to change his target range? Because his INR is in range, not sure why we would hold his Coumadin or change his dosage.  Please advise.  Thanks      ----- Message -----         From: Marcellus Scott, CMA         Sent: 05/04/2011   8:56 AM           To: Luan Moore, RN            Kingsley Plan!!!  Mr. Fluegel was in the office yesterday and saw Dr. Kriste Basque.  We did his protime and contacted him this morning about the results.  Dr. Kriste Basque told him to hold the coumadin for today and he is aware that the coumadin clinic will contact him for re-dosing.              Hope you have a great day.  thanks

## 2011-05-20 NOTE — H&P (View-Only) (Signed)
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 He has seen Dr. Nadel and Dr Nishan is my last visit with him. He has been cleared from cardiac standpoint for repair. He is here today with his daughter. I did have a long discussion regarding his diffuse aneurysmal formation in his bilateral hypogastric bilateral common femoral and bilateral popliteal arteries. All these are stable except for the very large aneurysm in his left distal superficial femoral and popliteal. CT scan there revealed maximal diameter and nearly 9 cm. He is symptomatic with pain from this making it more difficult for him to walk.  Past Medical History  Diagnosis Date  . Allergic rhinitis, cause unspecified   . Unspecified chronic bronchitis   . Other pulmonary embolism and infarction   . Hypertension   . Paroxysmal a-fib   . Cerebrovascular disease, unspecified   . Peripheral vascular disease, unspecified   . Unspecified venous (peripheral) insufficiency   . Acute venous embolism and thrombosis of unspecified deep vessels of lower extremity   . Hyperlipidemia   . GERD (gastroesophageal reflux disease)   . Diverticulosis of colon (without mention of hemorrhage)   . Irritable bowel syndrome   . Benign neoplasm of colon   . Malignant neoplasm of prostate   . Osteoarthrosis, unspecified whether generalized or localized, unspecified site   . Unspecified cerebral artery occlusion with cerebral infarction   . Unspecified hereditary and idiopathic peripheral neuropathy   . Depression     History  Substance Use Topics  . Smoking status: Never Smoker   . Smokeless tobacco: Never Used  . Alcohol Use: No    No family history on file.  Allergies  Allergen Reactions  . Amoxicillin     REACTION: rash  . Doxycycline     Rash    Current outpatient prescriptions:acetaminophen (TYLENOL) 650 MG CR tablet, Take 650 mg by mouth 2 (two)  times daily., Disp: , Rfl: ;  albuterol (PROAIR HFA) 108 (90 BASE) MCG/ACT inhaler, Inhale 1-2 puffs into the lungs every 6 (six) hours as needed., Disp: 1 Inhaler, Rfl: 11;  aspirin 81 MG tablet, Take 81 mg by mouth daily.  , Disp: , Rfl: ;  atorvastatin (LIPITOR) 20 MG tablet, take 1 tablet by mouth once daily, Disp: 30 tablet, Rfl: 5 calcium carbonate (TUMS - DOSED IN MG ELEMENTAL CALCIUM) 500 MG chewable tablet, As needed , Disp: , Rfl: ;  cholecalciferol (VITAMIN D) 1000 UNITS tablet, Take 1,000 Units by mouth daily.  , Disp: , Rfl: ;  dextromethorphan-guaiFENesin (MUCINEX DM) 30-600 MG per 12 hr tablet, Take 1 tablet by mouth as needed. , Disp: , Rfl: ;  famotidine (PEPCID) 10 MG tablet, Take 10 mg by mouth at bedtime.  , Disp: , Rfl:  Fluticasone Propionate (FLONASE NA), Place 30-600 mg into the nose as needed., Disp: , Rfl: ;  Fluticasone-Salmeterol (ADVAIR DISKUS) 250-50 MCG/DOSE AEPB, Inhale 1 puff into the lungs 2 (two) times daily. Rinse mouth well, Disp: 60 each, Rfl: 11;  furosemide (LASIX) 40 MG tablet, Take 2 tablets by mouth in the morning ., Disp: , Rfl:  oxyCODONE (OXY IR/ROXICODONE) 5 MG immediate release tablet, Take 1 tab every 4-6 hours as needed pain as needed only per pt , Disp: , Rfl: ;  potassium chloride SA (K-DUR,KLOR-CON) 20 MEQ tablet, Take 20 mEq by mouth daily., Disp: , Rfl: ;  predniSONE (DELTASONE) 10 MG tablet, Take 10 mg by mouth   every other day. Take 1/2 tablet every other day., Disp: , Rfl: ;  PROBIOTIC CAPS, Take 1 cap daily , Disp: , Rfl:  sennosides-docusate sodium (SENOKOT-S) 8.6-50 MG tablet, Use as needed for constipation , Disp: , Rfl: ;  warfarin (COUMADIN) 4 MG tablet, TAKE AS DIRECTED BY COUMADIN CLINIC, Disp: 100 tablet, Rfl: 2;  fluticasone (FLONASE) 50 MCG/ACT nasal spray, instill 2 sprays into each nostril once daily, Disp: 16 g, Rfl: 11;  predniSONE (DELTASONE) 10 MG tablet, take 1/2 tablet by mouth once daily, Disp: 30 tablet, Rfl: 3  BP 112/84  Pulse 80   Resp 16  Ht 6' (1.829 m)  Wt 164 lb (74.39 kg)  BMI 22.24 kg/m2  Body mass index is 22.24 kg/(m^2).       Review of systems: No change  Physical exam: Well-developed alert oriented white male in no acute distress Heart regular rate and rhythm Chest clear bilateral Extremities with a very large expansile pulsatile mass encompassing his entire distal left thigh to the popliteal space Both feet are very cyanotic which is chronic for the patient. He does not have palpable distal pulses.  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 I have recommended stent graft repair. I have recommended just repairing his multiple other aneurysm since her size is been stable. I explained they'll do have a potential for rupture but certainly would recommend against repairing multiple different aneurysms in this debilitated gentleman. He understands the procedure with hopefully a 1-2 night hospitalization and rapid return to his normal baseline. He has indicated that Dr.Nishan feels it is okay to stop his Coumadin for several days prior to surgery and then resuming it. We will confirm this with his office. Surgery scheduled for April 12 at [facility] 

## 2011-05-20 NOTE — Anesthesia Postprocedure Evaluation (Signed)
  Anesthesia Post-op Note  Patient: Glenn Barron [facility]  Procedure(s) Performed: Procedure(s) (LRB): BYPASS GRAFT POPLITEAL TO POPLITEAL (Left)  Patient Location: PACU  Anesthesia Type: MAC  Level of Consciousness: awake and alert   Airway and Oxygen Therapy: Patient Spontanous Breathing and Patient connected to nasal cannula oxygen  Post-op Pain: none  Post-op Assessment: Post-op Vital signs reviewed, Patient's Cardiovascular Status Stable, Respiratory Function Stable and Patent Airway  Post-op Vital Signs: Reviewed and stable  Complications: No apparent anesthesia complications

## 2011-05-20 NOTE — Preoperative (Signed)
Beta Blockers   Reason not to administer Beta Blockers:Not Applicable 

## 2011-05-20 NOTE — Transfer of Care (Signed)
Immediate Anesthesia Transfer of Care Note  Patient: Glenn Barron [facility]  Procedure(s) Performed: Procedure(s) (LRB): BYPASS GRAFT POPLITEAL TO POPLITEAL (Left)  Patient Location: PACU  Anesthesia Type: MAC  Level of Consciousness: awake, alert  and oriented  Airway & Oxygen Therapy: Patient Spontanous Breathing and Patient connected to face mask oxygen  Post-op Assessment: Report given to PACU RN, Post -op Vital signs reviewed and stable and Patient moving all extremities X 4  Post vital signs: Reviewed and stable  Complications: No apparent anesthesia complications

## 2011-05-20 NOTE — Telephone Encounter (Signed)
Will change pt's INR range based on Dr Fabio Bering and Dr Jodelle Green recommendations 2.0-2.5.

## 2011-05-20 NOTE — OR Nursing (Signed)
Total C-Arm time 37 seconds

## 2011-05-20 NOTE — Progress Notes (Signed)
ANTIBIOTIC CONSULT NOTE - INITIAL  Pharmacy Consult for Pharmacy to adjust antibiotics (vancomycin) for renal function. Indication: post-op prophylaxis  Allergies  Allergen Reactions  . Amoxicillin     REACTION: rash  . Doxycycline     Rash    Patient Measurements: Height: 6' (182.9 cm) Weight: 167 lb 12.3 oz (76.1 kg) IBW/kg (Calculated) : 77.6  Adjusted Body Weight: 76.1 kg  Vital Signs: Temp: 97.4 F (36.3 C) (04/12 1625) Temp src: Oral (04/12 1625) BP: 131/92 mmHg (04/12 1800) Pulse Rate: 98  (04/12 1625) Intake/Output from previous day:   Intake/Output from this shift:    Labs:  [facility] 05/20/11 0834  WBC 9.3  HGB 16.1  PLT 124*  LABCREA --  CREATININE --   Estimated Creatinine Clearance: 57.1 ml/min (by C-G formula based on Cr of 1). No results found for this basename: VANCOTROUGH:2,VANCOPEAK:2,VANCORANDOM:2,GENTTROUGH:2,GENTPEAK:2,GENTRANDOM:2,TOBRATROUGH:2,TOBRAPEAK:2,TOBRARND:2,AMIKACINPEAK:2,AMIKACINTROU:2,AMIKACIN:2, in the last 72 hours   Microbiology: Recent Results (from the past 720 hour(s))  SURGICAL PCR SCREEN     Status: Normal   Collection Time   05/17/11 10:03 AM      Component Value Range Status Comment   MRSA, PCR NEGATIVE  NEGATIVE  Final    Staphylococcus aureus NEGATIVE  NEGATIVE  Final     Medical History: Past Medical History  Diagnosis Date  . Allergic rhinitis, cause unspecified   . Unspecified chronic bronchitis   . Other pulmonary embolism and infarction   . Paroxysmal a-fib   . Cerebrovascular disease, unspecified 10/2002    right side weakness  . Peripheral vascular disease, unspecified   . Unspecified venous (peripheral) insufficiency   . Acute venous embolism and thrombosis of unspecified deep vessels of lower extremity   . Hyperlipidemia   . GERD (gastroesophageal reflux disease)   . Diverticulosis of colon (without mention of hemorrhage)   . Irritable bowel syndrome   . Benign neoplasm of colon   . Malignant  neoplasm of prostate   . Osteoarthrosis, unspecified whether generalized or localized, unspecified site   . Unspecified cerebral artery occlusion with cerebral infarction   . Unspecified hereditary and idiopathic peripheral neuropathy   . Depression   . Hypertension     Dr. Eden Emms cardiologist   . Right bundle branch block   . Asthma   . History of scarlet fever   . Aortic stenosis, mild     mild by echo 04/2011    Medications:  Scheduled:    . aspirin EC  81 mg Oral Daily  . cholecalciferol  1,000 Units Oral Daily  . docusate sodium  100 mg Oral Daily  . famotidine  10 mg Oral QHS  . fluticasone  1 spray Each Nare Daily  . Fluticasone-Salmeterol  1 puff Inhalation BID  . furosemide  40 mg Oral Daily  . pantoprazole  40 mg Oral Q1200  . potassium chloride SA  20 mEq Oral Daily  . predniSONE  5 mg Oral QODAY  . simvastatin  20 mg Oral Daily  . vancomycin  1,000 mg Intravenous On Call to OR  . vancomycin  1,000 mg Intravenous Q12H  . DISCONTD: acetaminophen  650 mg Oral BID  . DISCONTD: aspirin  81 mg Oral Daily  . DISCONTD: PROBIOTIC  1 capsule Oral Daily  . DISCONTD: warfarin  4-6 mg Oral Daily   Assessment: 78 yo male s/p popliteal bypass grafting, to receive vancomycin x 2 doses post-op for prophylaxis.  CrCl ~ 58 ml/min.  Goal of Therapy:  n/a  Plan:  1. Patient  has received 2 doses of vancomycin 1g today.  Will reduce last dose tomorrow AM to 750 mg. 2. Pharmacy will sign-off, please contact if questions.  Thanks!  Gardner Candle 05/20/2011,8:00 PM

## 2011-05-20 NOTE — Anesthesia Preprocedure Evaluation (Addendum)
Anesthesia Evaluation  Patient identified by MRN, date of birth, ID band Patient awake    Reviewed: Allergy & Precautions, H&P , NPO status , Patient's Chart, lab work & pertinent test results  Airway Mallampati: II TM Distance: >3 FB Neck ROM: Full    Dental No notable dental hx. (+) Teeth Intact   Pulmonary asthma ,  breath sounds clear to auscultation  Pulmonary exam normal       Cardiovascular hypertension, On Medications +CHF + dysrhythmias Atrial Fibrillation Rhythm:Regular Rate:Normal  Echo 04/27/11 EF 55-60%, mild MR, normal LV size with moderate LVH   Neuro/Psych PSYCHIATRIC DISORDERS  Neuromuscular disease CVA, Residual Symptoms    GI/Hepatic Neg liver ROS, GERD-  Controlled and Medicated,  Endo/Other  negative endocrine ROS  Renal/GU negative Renal ROS  negative genitourinary   Musculoskeletal   Abdominal   Peds  Hematology negative hematology ROS (+)   Anesthesia Other Findings   Reproductive/Obstetrics negative OB ROS                         Anesthesia Physical Anesthesia Plan  ASA: III  Anesthesia Plan: MAC   Post-op Pain Management:    Induction: Intravenous  Airway Management Planned: Simple Face Mask  Additional Equipment:   Intra-op Plan:   Post-operative Plan:   Informed Consent: I have reviewed the patients History and Physical, chart, labs and discussed the procedure including the risks, benefits and alternatives for the proposed anesthesia with the patient or authorized representative who has indicated his/her understanding and acceptance.     Plan Discussed with: CRNA  Anesthesia Plan Comments:         Anesthesia Quick Evaluation

## 2011-05-20 NOTE — Op Note (Signed)
OPERATIVE REPORT  DATE OF SURGERY: 05/20/2011  PATIENT: Glenn Barron, 78 y.o. male MRN: [id number]  DOB: 02-Sep-1922  PRE-OPERATIVE DIAGNOSIS: Expanding large left popliteal artery aneurysm  POST-OPERATIVE DIAGNOSIS:  Same  PROCEDURE: Stent graft repair of large left popliteal artery aneurysm with Gore Viabahn stent graft  SURGEON:  Gretta Began, M.D.  PHYSICIAN ASSISTANT: [place]  ANESTHESIA:  MAC  EBL: 50 ml  Total I/O In: 1300 [I.V.:1300] Out: 50 [Blood:50]  BLOOD ADMINISTERED: None  DRAINS: None  SPECIMEN: None  COUNTS CORRECT:  YES  PLAN OF CARE: PACU stable   PATIENT DISPOSITION:  PACU - hemodynamically stable  PROCEDURE DETAILS: The patient is a very debilitated 78 year old gentleman with multiple aneurysms. He has a remote history of abdominal aortic aneurysm repair aortobiiliac bypass. He had subsequent development of common iliac artery aneurysms below the repair also bilateral hypogastric artery aneurysms bilateral femoral artery aneurysms and bilateral popliteal artery aneurysms. Due to the extensive nature of these aneurysms and his debilitated state it was recommended that he have observation only versus attempting elective repair of all of these aneurysms. We'll follow the patient returned with discomfort and enlargement of the left popliteal aneurysm. CT imaging revealed a very large 9 cm aneurysm in the distal superficial femoral artery extending down to the above-knee popliteal. Due to the to this would rupture and since it was causing him symptoms due to size was recommended he undergo elective repair with Viabahn stent graft. The risks including perioperative risk and the risk of continued expansion rupture were discussed with the patient understands.  The patient was taken to the operating room placed supine position. The left groin left leg were prepped and draped in a sterile fashion. Ultrasound was used to identify the level of the proximal superficial  femoral artery in the proximal thigh. Incision was made over this area using local anesthetic at and isolate the superficial femoral artery below the profunda takeoff. The artery had moderate calcification and plaque the flow channel and was of good caliber. An 18-gauge needle was used to access the artery in antegrade fashion directing guidewire down to the level of the knee. A 7 French sheath was passed over the guidewire. The guidewire with coil in the large aneurysm. A comfy catheter was then placed over the guidewire position the level of the distal aneurysm. An arteriogram was obtained with a 20 cc injection. This revealed the large aneurysm and did reveal the area of the popliteal artery below this. There was a dramatic size change from the large aneurysm to the slightly enlarged up to artery. Using a comfy catheter and Bentson wire the fill artery was accessed and Bentson wire was passed to the level of below the knee to the trifurcation. A comfy catheter tracked over the a Amplatz superstiff wire was then exchanged with the Bentson wire. A 12 French dry shield sheath were then passed over the wire through the superficial femoral artery. A 6 mm x 15 cm Viabahn stent was passed over the wire through the dry shield sheath. The distal portion of this was positioned in the popliteal artery in the above-knee position. His was deployed. Next in 8 mm x 10 cm Viabahn stent graft was passed with an overlap of approximately 4 cm and a 6 mm stent graft. This was deployed. Finally a hand injection was taken through the 12 French sheath showed the more proximal superficial femoral artery. The area of landing for the proximal portion of the 10 cm stent graft was  chosen. This was passed over the wire and deployed with approximately 3 cm overlap into the millimeter stent graft. Next a 10 mm in size the balloon was used with hand insufflation starting at the distal attachment at the above-knee popliteal extending across both  junctions up to the proximal attachment. A final injection was taken through the sheath this revealed excellent positioning of the stent graft with no evidence of endoleak. There was good attachment and the superficial femoral artery above the aneurysm and good attachment of the popliteal below. Tibial vessels revealed chronic disease in the posterior tibial with runoff via mainly the anterior tibial artery.  The patient had been given 5000 units of intravenous heparin prior to placing the 12 French sheath. The sheath was removed and the superficial femoral artery was occluded proximally and distally with a Hanley clamp the artery was closed with interrupted 5-0 Prolene sutures. Several sutures were required. Clamps were removed and good flow was noted through the superficial artery. The patient had very sluggish flow throughout the case arteriograms due to low cardiac output. The patient was given 50 mg of protamine to reverse the heparin. The fascial layer was closed with a running 2-0 Vicryl suture the subcutaneous tissue and subcuticular tissue were closed with 3-0 Vicryl suture. Sterile dressing was applied the patient was taken to the recovery room in stable condition.    Gretta Began, M.D. 05/20/2011 1:14 PM

## 2011-05-20 NOTE — Discharge Summary (Signed)
[facility] Discharge Summary  Glenn Barron [birth year] 78 y.o. male  [id number]  Admission Date: 05/20/2011  Discharge Date: 05-21-2011  Physician: Glenn Earthly, MD  Admission Diagnosis: LEFT POPLITEAL ARTERY ANEURYSM   HPI:   This is a 78 y.o. 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 He has seen Dr. Kriste Barron and Dr Glenn Barron is my last visit with him. He has been cleared from cardiac standpoint for repair. He is here today with his daughter. I did have a long discussion regarding his diffuse aneurysmal formation in his bilateral hypogastric bilateral common femoral and bilateral popliteal arteries. All these are stable except for the very large aneurysm in his left distal superficial femoral and popliteal. CT scan there revealed maximal diameter and nearly 9 cm. He is symptomatic with pain from this making it more difficult for him to walk. He walked from bed to chair with little difficulty this am.   Hospital Course:  The patient was admitted to the hospital and taken to the operating room on 05/20/2011 and underwent stenting of the left popliteal artery..  The pt tolerated the procedure well and was transported to the PACU in good condition. Patients stay was uneventful.  He is voiding independently and ambulating with a rolling walker.  Incision is clean dry and intact.  The remainder of the hospital course consisted of increasing ambulation and increasing intake of solids without difficulty.  CBC    Component Value Date/Time   WBC 9.3 05/20/2011 0834   RBC 4.96 05/20/2011 0834   HGB 16.1 05/20/2011 0834   HCT 48.0 05/20/2011 0834   PLT 124* 05/20/2011 0834   MCV 96.8 05/20/2011 0834   MCH 32.5 05/20/2011 0834   MCHC 33.5 05/20/2011 0834   RDW 13.4 05/20/2011 0834   LYMPHSABS 3.7 05/17/2011 1002   MONOABS 1.4* 05/17/2011 1002   EOSABS 0.1 05/17/2011 1002   BASOSABS 0.0 05/17/2011 1002      BMET    Component Value Date/Time   NA 137 05/17/2011 1002   K 4.1 05/17/2011 1002   CL 102 05/17/2011 1002   CO2 21 05/17/2011 1002   GLUCOSE 99 05/17/2011 1002   BUN 24* 05/17/2011 1002   CREATININE 1.00 05/17/2011 1002   CALCIUM 9.9 05/17/2011 1002   GFRNONAA 66* 05/17/2011 1002   GFRAA 76* 05/17/2011 1002     Discharge Instructions:   The patient is discharged to home with extensive instructions on wound care and progressive ambulation.  They are instructed not to drive or perform any heavy lifting until returning to see the physician in his office.  Discharge Orders    Future Orders Please Complete By Expires   Resume previous diet      Driving Restrictions      Comments:   No driving for 4 weeks   Lifting restrictions      Comments:   No lifting for 6 weeks   Call MD for:  temperature >100.5      Call MD for:  redness, tenderness, or signs of infection (pain, swelling, bleeding, redness, odor or green/yellow discharge around incision site)      Call MD for:  severe or increased pain, loss or decreased feeling  in affected limb(s)      may wash over wound with mild soap and water      Scheduling Instructions:   Shower daily with soap and water starting 05/22/11  Discharge Diagnosis:  LEFT POPLITEAL ARTERY ANEURYSM  Secondary Diagnosis: Patient Active Problem List  Diagnoses  . PROSTATE CANCER  . COLONIC POLYPS  . HYPERLIPIDEMIA  . ANXIETY DEPRESSION  . PERIPHERAL NEUROPATHY  . HYPERTENSION  . PULMONARY EMBOLISM  . PAROXYSMAL ATRIAL FIBRILLATION  . CONGESTIVE HEART FAILURE  . STROKE  . CEREBROVASCULAR DISEASE  . PERIPHERAL VASCULAR DISEASE  . VENOUS INSUFFICIENCY, CHRONIC  . ACUTE BRONCHITIS  . ALLERGIC RHINITIS  . BRONCHITIS, RECURRENT  . GERD  . DIVERTICULOSIS OF COLON  . IRRITABLE BOWEL SYNDROME  . DEGENERATIVE JOINT DISEASE  . EDEMA  . Hx-major cardiovasc surg  . CATARACT EXTRACTION, HX OF  . INGUINAL HERNIORRHAPHIES, BILATERAL, HX OF  . DVT (deep  venous thrombosis)  . Bell's palsy  . Encounter for long-term (current) use of anticoagulants  . Cellulitis of foot, right  . Gait abnormality  . Abdominal aneurysm without mention of rupture  . Preop cardiovascular exam   Past Medical History  Diagnosis Date  . Allergic rhinitis, cause unspecified   . Unspecified chronic bronchitis   . Other pulmonary embolism and infarction   . Paroxysmal a-fib   . Cerebrovascular disease, unspecified 10/2002    right side weakness  . Peripheral vascular disease, unspecified   . Unspecified venous (peripheral) insufficiency   . Acute venous embolism and thrombosis of unspecified deep vessels of lower extremity   . Hyperlipidemia   . GERD (gastroesophageal reflux disease)   . Diverticulosis of colon (without mention of hemorrhage)   . Irritable bowel syndrome   . Benign neoplasm of colon   . Malignant neoplasm of prostate   . Osteoarthrosis, unspecified whether generalized or localized, unspecified site   . Unspecified cerebral artery occlusion with cerebral infarction   . Unspecified hereditary and idiopathic peripheral neuropathy   . Depression   . Hypertension     Dr. Eden Barron cardiologist   . Right bundle branch block   . Asthma   . History of scarlet fever   . Aortic stenosis, mild     mild by echo 04/2011      [name], [name]  Home Medication Instructions [id number]   Printed on:05/20/11 1307  Medication Information                    aspirin 81 MG tablet Take 81 mg by mouth daily.             PROBIOTIC CAPS Take 1 capsule by mouth daily.            famotidine (PEPCID) 10 MG tablet Take 10 mg by mouth at bedtime.             calcium carbonate (TUMS - DOSED IN MG ELEMENTAL CALCIUM) 500 MG chewable tablet As needed            cholecalciferol (VITAMIN D) 1000 UNITS tablet Take 1,000 Units by mouth daily.             albuterol (PROAIR HFA) 108 (90 BASE) MCG/ACT inhaler Inhale 1-2 puffs into the lungs every 6 (six) hours  as needed.           fluticasone (FLONASE) 50 MCG/ACT nasal spray instill 2 sprays into each nostril once daily           Fluticasone-Salmeterol (ADVAIR DISKUS) 250-50 MCG/DOSE AEPB Inhale 1 puff into the lungs 2 (two) times daily. Rinse mouth well           acetaminophen (TYLENOL) 650 MG CR tablet  Take 650 mg by mouth 2 (two) times daily.           atorvastatin (LIPITOR) 20 MG tablet take 1 tablet by mouth once daily           potassium chloride SA (K-DUR,KLOR-CON) 20 MEQ tablet Take 20 mEq by mouth daily.           furosemide (LASIX) 40 MG tablet Take 2 tablets by mouth in the morning .           predniSONE (DELTASONE) 10 MG tablet Take 5 mg by mouth every other day.           warfarin (COUMADIN) 4 MG tablet Take 4-6 mg by mouth daily. Take 1.5 tablets (6mg ) on Monday and Friday. Take 1 tablet (4mg ) the rest of the week.           polyethylene glycol (MIRALAX / GLYCOLAX) packet Take 17 g by mouth as needed.           oxycodone (OXYIR) 5 MG capsule Take 1 capsule (5 mg total) by mouth every 6 (six) hours as needed. #30 (thirty) NR            Disposition: home  Patient's condition: is Good  Follow up: 1. Dr. Arbie Cookey in 2 weeks with ABIs He will resume his standard coumadin regimen at home and f/u in the coumadin clinic Mon. Or Tuesday.  Newton Pigg, PA-C [facility] [phone] 05/20/2011  1:07 PM

## 2011-05-20 NOTE — Interval H&P Note (Signed)
History and Physical Interval Note:  05/20/2011 10:35 AM  Glenn Barron  has presented today for surgery, with the diagnosis of LEFT POPLITEAL ARTERY ANEURYSM  The various methods of treatment have been discussed with the patient and family. After consideration of risks, benefits and other options for treatment, the patient has consented to  Procedure(s) (LRB): BYPASS GRAFT POPLITEAL TO POPLITEAL (Left) as a surgical intervention .  The patients' history has been reviewed, patient examined, no change in status, stable for surgery.  I have reviewed the patients' chart and labs.  Questions were answered to the patient's satisfaction.     Deuce Paternoster

## 2011-05-21 LAB — BASIC METABOLIC PANEL
CO2: 27 mEq/L (ref 19–32)
Calcium: 9 mg/dL (ref 8.4–10.5)
Creatinine, Ser: 0.95 mg/dL (ref 0.50–1.35)
GFR calc non Af Amer: 73 mL/min — ABNORMAL LOW (ref >90)
Glucose, Bld: 100 mg/dL — ABNORMAL HIGH (ref 70–99)
Sodium: 140 mEq/L (ref 135–145)

## 2011-05-21 LAB — CBC
MCH: 32.8 pg (ref 26.0–34.0)
MCV: 97.3 fL (ref 78.0–100.0)
Platelets: 103 10*3/uL — ABNORMAL LOW (ref 150–400)
RBC: 4 MIL/uL — ABNORMAL LOW (ref 4.22–5.81)
RDW: 13.5 % (ref 11.5–15.5)

## 2011-05-21 NOTE — Progress Notes (Signed)
Pt discharged home with daughter per MD order. All discharge information reviewed and all questions answered.

## 2011-05-21 NOTE — Progress Notes (Addendum)
[facility] [place]  Progress Note Bypass Surgery  Date of Surgery: 05/20/2011  Procedure(s): BYPASS GRAFT POPLITEAL TO POPLITEAL Surgeon: Surgeon(s): Larina Earthly, MD  1 Day Post-Op  History of Present Illness  Glenn Barron is a 78 y.o. male who is S/P Procedure(s): BYPASS GRAFT POPLITEAL TO POPLITEAL left.  The patient's pre-op symptoms of large symptomatic distal superficial femoral and popliteal aneurysm on the left   are Improved . Patients pain is well controlled.      Imaging: Dg Femur Left  05/20/2011  *RADIOLOGY REPORT*  Clinical Data: Popliteal artery aneurysm  C-ARM 1-60 MIN,LEFT FEMUR - 2 VIEW  Comparison: None.  Findings: The series of images demonstrate popliteal artery aneurysm and treatment with stent graft.  IMPRESSION: Popliteal artery stent graft.  Original Report Authenticated By: Donavan Burnet, M.D.   Dg C-arm 1-60 Min  05/20/2011  *RADIOLOGY REPORT*  Clinical Data: Popliteal artery aneurysm  C-ARM 1-60 MIN,LEFT FEMUR - 2 VIEW  Comparison: None.  Findings: The series of images demonstrate popliteal artery aneurysm and treatment with stent graft.  IMPRESSION: Popliteal artery stent graft.  Original Report Authenticated By: Donavan Burnet, M.D.    Significant Diagnostic Studies: CBC Lab Results  Component Value Date   WBC 8.4 05/21/2011   HGB 13.1 05/21/2011   HCT 38.9* 05/21/2011   MCV 97.3 05/21/2011   PLT PENDING 05/21/2011    BMET     Component Value Date/Time   NA 137 05/17/2011 1002   K 4.1 05/17/2011 1002   CL 102 05/17/2011 1002   CO2 21 05/17/2011 1002   GLUCOSE 99 05/17/2011 1002   BUN 24* 05/17/2011 1002   CREATININE 1.00 05/17/2011 1002   CALCIUM 9.9 05/17/2011 1002   GFRNONAA 66* 05/17/2011 1002   GFRAA 76* 05/17/2011 1002    COAG Lab Results  Component Value Date   INR 1.39 05/20/2011   INR 2.62* 05/17/2011   INR 2.9* 05/03/2011   PROTIME 19.9 07/11/2008   No results found for this basename: PTT    Physical Examination  BP  Readings from Last 3 Encounters:  05/21/11 105/59  05/21/11 105/59  05/17/11 126/81   Temp Readings from Last 3 Encounters:  05/21/11 97.4 F (36.3 C) Oral  05/21/11 97.4 F (36.3 C) Oral  05/17/11 97 F (36.1 C)    SpO2 Readings from Last 3 Encounters:  05/21/11 95%  05/21/11 95%  05/17/11 98%   Pulse Readings from Last 3 Encounters:  05/21/11 62  05/21/11 62  05/17/11 73    [name] A&O x 3 left lower extremity: Incision/s is/are clean,dry.intact, and  healing without hematoma, erythema or drainage Limb is warm; with good color  Left Dorsalis Pedis pulse is biphasic by Doppler LeftPosterior tibial pulse is  biphasic by Doppler     Assessment/Plan: Pt. Doing well Post-op pain is controlled Wounds are clean, dry, intact or healing well Coumadin restart today D/C home with daughter.   Clinton Gallant [facility] [phone] 05/21/2011 7:17 AM  Agree with above Doppler signals Mild oozing from wound Agree with d/c  Follow up coumadin clinic next week Re-start coumadin today.  Durene Cal

## 2011-05-23 ENCOUNTER — Telehealth: Payer: Self-pay | Admitting: Pharmacist

## 2011-05-23 ENCOUNTER — Telehealth: Payer: Self-pay | Admitting: Cardiovascular Disease

## 2011-05-23 NOTE — Telephone Encounter (Signed)
Patient called for post-surgery coumadin dosing instructions.  He took 6mg  on Saturday (4/13) & 4mg  on Sunday (4/14).  I instructed him to take an extra 1/2 tablet today, then resume his normal dosing, and scheduled him an INR check for Friday (4/19).

## 2011-05-23 NOTE — Telephone Encounter (Signed)
New Problem:    Patient recently had surgery and wished to speak with someone about his next checks.  Please call back.

## 2011-05-24 ENCOUNTER — Telehealth: Payer: Self-pay

## 2011-05-24 NOTE — Telephone Encounter (Signed)
Pt. Called to report symptoms in surgical leg.  States the steri strips have fallen off. Noted "one pea-size drop of blood from the lower part of incision" this morning.  States he cleaned it with a sterile q-tip and H202, and applied clean dressing.  Denies any further bleeding or drainage.  States incision is intact.  Denies inflammation of incision. Describes a "dark red color around the perimeter of incision, and along the incision line looks pale".  States he had temperature last pm of 100.1, and this am temp is 98.3.  Reports left leg swelling and had difficulty putting on compression stocking today, but did get it on.  Advised to continue to elevate left leg above heart level at intervals throughout day.  Advised to watch for increase in temperature, inflammation of incision, increase in drainage, or opening of incision, and report to office.  Verbalized understanding.

## 2011-05-27 ENCOUNTER — Ambulatory Visit (INDEPENDENT_AMBULATORY_CARE_PROVIDER_SITE_OTHER): Payer: Medicare Other

## 2011-05-27 DIAGNOSIS — I4891 Unspecified atrial fibrillation: Secondary | ICD-10-CM

## 2011-05-27 DIAGNOSIS — I82409 Acute embolism and thrombosis of unspecified deep veins of unspecified lower extremity: Secondary | ICD-10-CM

## 2011-05-27 DIAGNOSIS — I2699 Other pulmonary embolism without acute cor pulmonale: Secondary | ICD-10-CM

## 2011-05-27 DIAGNOSIS — Z7901 Long term (current) use of anticoagulants: Secondary | ICD-10-CM

## 2011-05-27 LAB — POCT INR: INR: 1.6

## 2011-05-31 ENCOUNTER — Ambulatory Visit: Payer: Medicare Other | Admitting: Vascular Surgery

## 2011-06-03 ENCOUNTER — Ambulatory Visit (INDEPENDENT_AMBULATORY_CARE_PROVIDER_SITE_OTHER): Payer: Medicare Other

## 2011-06-03 DIAGNOSIS — I2699 Other pulmonary embolism without acute cor pulmonale: Secondary | ICD-10-CM

## 2011-06-03 DIAGNOSIS — I4891 Unspecified atrial fibrillation: Secondary | ICD-10-CM

## 2011-06-03 DIAGNOSIS — I82409 Acute embolism and thrombosis of unspecified deep veins of unspecified lower extremity: Secondary | ICD-10-CM

## 2011-06-03 DIAGNOSIS — Z7901 Long term (current) use of anticoagulants: Secondary | ICD-10-CM

## 2011-06-03 LAB — POCT INR: INR: 3

## 2011-06-06 ENCOUNTER — Encounter: Payer: Self-pay | Admitting: Vascular Surgery

## 2011-06-07 ENCOUNTER — Ambulatory Visit (INDEPENDENT_AMBULATORY_CARE_PROVIDER_SITE_OTHER): Payer: Medicare Other | Admitting: Vascular Surgery

## 2011-06-07 ENCOUNTER — Encounter: Payer: Self-pay | Admitting: Vascular Surgery

## 2011-06-07 ENCOUNTER — Encounter (INDEPENDENT_AMBULATORY_CARE_PROVIDER_SITE_OTHER): Payer: Medicare Other | Admitting: *Deleted

## 2011-06-07 ENCOUNTER — Telehealth: Payer: Self-pay | Admitting: Pulmonary Disease

## 2011-06-07 VITALS — BP 128/89 | HR 77 | Resp 18 | Ht 71.0 in | Wt 155.0 lb

## 2011-06-07 DIAGNOSIS — I70219 Atherosclerosis of native arteries of extremities with intermittent claudication, unspecified extremity: Secondary | ICD-10-CM

## 2011-06-07 DIAGNOSIS — I724 Aneurysm of artery of lower extremity: Secondary | ICD-10-CM

## 2011-06-07 DIAGNOSIS — I7092 Chronic total occlusion of artery of the extremities: Secondary | ICD-10-CM

## 2011-06-07 DIAGNOSIS — Z48812 Encounter for surgical aftercare following surgery on the circulatory system: Secondary | ICD-10-CM

## 2011-06-07 NOTE — Telephone Encounter (Signed)
I spoke with pt and is scheduled to come in at that time. Nothing further was needed 

## 2011-06-07 NOTE — Progress Notes (Signed)
Patient presents today for followup of stent graft repair of his very large distal superficial femoral artery and left popliteal artery aneurysm on 05/20/2011. This was done under local with sedation in the operating room. He had a cut down on his left proximal superficial femoral artery and had covered stents placed from the popliteal artery to the mid superficial femoral artery. He was discharged home on postoperative day #1. He continues to do well at home. He does have his usual status significant bilateral lower surety swelling. He does have some bruising in his medial thigh related to the upper thigh incision. I reassured him that this will continue to resolve. Does have a palpable popliteal pulse. I do not palpate pedal pulses most likely related to the swelling. He did undergo noninvasive study in our office today and this reveals normal ankle arm index with biphasic left dorsalis pedis flow. Most striking is his exam of his left thigh. He did have expansile pulsatile mass related to his 9 cm aneurysm. This is completely resolved. He will continue his usual activity and we plan to see him again in one month with a CT angiogram followup of his covered stent repair of his large left superficial artery and popliteal aneurysm

## 2011-06-07 NOTE — Telephone Encounter (Signed)
Please advise leigh were pt can be worked in thanks

## 2011-06-07 NOTE — Progress Notes (Signed)
Addended by: Sharee Pimple on: 06/07/2011 01:32 PM   Modules accepted: Orders

## 2011-06-07 NOTE — Telephone Encounter (Signed)
Ok to schedule HFU on 5-23 at 3pm.  thanks

## 2011-06-17 ENCOUNTER — Ambulatory Visit (INDEPENDENT_AMBULATORY_CARE_PROVIDER_SITE_OTHER): Payer: Medicare Other | Admitting: Pharmacist

## 2011-06-17 DIAGNOSIS — I4891 Unspecified atrial fibrillation: Secondary | ICD-10-CM

## 2011-06-17 DIAGNOSIS — Z7901 Long term (current) use of anticoagulants: Secondary | ICD-10-CM

## 2011-06-17 DIAGNOSIS — I82409 Acute embolism and thrombosis of unspecified deep veins of unspecified lower extremity: Secondary | ICD-10-CM

## 2011-06-17 DIAGNOSIS — I2699 Other pulmonary embolism without acute cor pulmonale: Secondary | ICD-10-CM

## 2011-06-30 ENCOUNTER — Inpatient Hospital Stay: Payer: Medicare Other | Admitting: Pulmonary Disease

## 2011-06-30 ENCOUNTER — Ambulatory Visit (INDEPENDENT_AMBULATORY_CARE_PROVIDER_SITE_OTHER): Payer: Medicare Other | Admitting: Pulmonary Disease

## 2011-06-30 ENCOUNTER — Encounter: Payer: Self-pay | Admitting: Pulmonary Disease

## 2011-06-30 VITALS — BP 128/76 | HR 70 | Temp 98.8°F | Ht 72.0 in | Wt 169.4 lb

## 2011-06-30 DIAGNOSIS — M1990 Unspecified osteoarthritis, unspecified site: Secondary | ICD-10-CM

## 2011-06-30 DIAGNOSIS — F341 Dysthymic disorder: Secondary | ICD-10-CM

## 2011-06-30 DIAGNOSIS — I679 Cerebrovascular disease, unspecified: Secondary | ICD-10-CM

## 2011-06-30 DIAGNOSIS — G609 Hereditary and idiopathic neuropathy, unspecified: Secondary | ICD-10-CM

## 2011-06-30 DIAGNOSIS — I724 Aneurysm of artery of lower extremity: Secondary | ICD-10-CM

## 2011-06-30 DIAGNOSIS — C61 Malignant neoplasm of prostate: Secondary | ICD-10-CM

## 2011-06-30 DIAGNOSIS — I6350 Cerebral infarction due to unspecified occlusion or stenosis of unspecified cerebral artery: Secondary | ICD-10-CM

## 2011-06-30 DIAGNOSIS — I4891 Unspecified atrial fibrillation: Secondary | ICD-10-CM

## 2011-06-30 DIAGNOSIS — I739 Peripheral vascular disease, unspecified: Secondary | ICD-10-CM

## 2011-06-30 DIAGNOSIS — I10 Essential (primary) hypertension: Secondary | ICD-10-CM

## 2011-06-30 NOTE — Patient Instructions (Signed)
Today we updated your med list in our EPIC system...    Continue your current medications the same...  Glenn Barron, you look great; keep up the good work...  Let's plan a follow up visit in 3 months, call sooner if needed for problems.Marland KitchenMarland Kitchen

## 2011-06-30 NOTE — Progress Notes (Signed)
Subjective:    Patient ID: Glenn Barron, male    DOB: [birth year], 78 y.o.   MRN: [id number]  HPI 78 y/o WM here for a follow up visit... he has mult med problems as noted below... Followed for general medical purposes w/ hx of recurrent bronchitis & allergies; hx PTE/ DVT/ & IVC filter on Coumadin; HBP; AFib; cerebrovasc dis w/ hx stroke & prev right CAE; ASPVD w/ prev AAA repair & subseq bilat Ao-iliac bypasses; Hyperchol; Prostate cancer; neuropathy; and anxiety/ depression...  ~  March 22, 2010:   6 wk ROV- still weak but improving slowly> He has mult somatic complaints but overall appears stable;  he is in AFib clinically w/ controlled VR & HR=90, continues on Coumadin via CC;  he has persist LE edema w/ pitting & wt up 3#> on Lasix 40mg /d, states he's restricting sodium (but eating TV dinners), elevating legs, wearing support hose;  BP well controlled at 116/80 & we discussed 2gm sodium restriction & incr Lasix 40-80mg  daily...    Breathing is stable & he's been on Pred 10mg - 1/2 daily;  we discussed weaning to 5mg  Qod, continue Advair, etc...    He has a prob w/ his right hand> can't dorsiflex the 3rd-4th fingers & has appt w/ Glenn Barron to eval...    He notes some constipation & we have rec Miralax daily if necessary for regulation, along w/ his Senakot-S, Align, etc...  ~  June 01, 2010:  95mo ROV- generally stable & gaining strength slowly (he has good exerc program, still has help at home [facility] per day);  He had right Bell's Palsey 3/12 & went to ER then called [place] & we Rx w/ Valtrex & Pred Dosepak ==> resolved & back to normal now;  Breathing stable on the Pred 5mg  Qod, BP looks good & edema is sl better w/ his incr to 60mg /d> we discussed checking BMet (K=4.6, BUN=22, Creat=1.1) and incr Lasix to 80mg  Qam;  CV stable w/o CP or cerebral ischemic symptoms...  We reviewed his meds & decided to continue others the same for now...  ~  September 01, 2010:  95mo ROV & he remains stable, had SHINGLES  vaccine 6/12 & we gave him the TDAP today; he follows a low sodium diet & will return for FASTING blood work next week... See prob list below>>  ~  January 04, 2011:  10mo ROV & he reports generally stable> cough & congestion improved, using MucinexDM; BP stable on Lasix+KCl; Cardiac stable, denies cerebral ischemic symptoms; labs from 7/12 reviewed; had skin cancer removed by Derm...  ~  February 22, 2011:  6wk ROV & add-on for f/u cellulitis> he saw TP 12/12 w/ acute swelling & redness right foot/ ankle/ leg, area is tender, no broken skin or trauma reported, no drainage, denies fever, no CP/ SOB, etc;  [name] already on Coumadin, followed in the CC & therapeutic, he had IVC filter placed yrs ago; Treated w/ Doxy po & infective symptoms improved> less red/ inflammed etc but still swollen- VI w/ edema up to his thigh on Lasix 40mg - 2Qam...  We decided to incr lasix to 80mg  Bid, 2gm Na+ diet (he's been eating TV dinners, soups, etc), elevation, etc... Plan f/u w/ labs in 7month.    Labs 12/12 showed Chems- ok w/ BUN=19, Creat=1.9, BNP=237;  CBC w/ Hg=15.5, WBC=10.9;  Wound cult- neg, no staph etc;  XRay right foot showed soft tissue swelling, no bone abn seen...  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     Labs today showed:  Chems- ok w/ BUN=32, Creat=1.1, K=5.0, BNP=159;  CBC- ok w/ Hg=16.1, WBC=9.6.Marland KitchenMarland Kitchen We decided to decr the Lasix40 slightly to 2Qam & 1Qpm...  Plan ROV 6weeks.  ~  May 03, 2011:  6week [place] & he has seen Glenn Barron w/CT Angio done 2/13 w/ signif enlargement of aneurysm in the distal SFA & Pop art; mult other  aneurysms seen as well & several are bigger as well (see report); he has also had f/u Glenn Barron 3/13 & EKG shows AFib, rate74, RBBB, septal infarct & poor R progression... 2DEcho 3/13 showed mod LVH, norm LVF w/ EF=55-60%, mild AS, mild MR, mod LAdil measuring 52mm...  OK for surgery>> LABS 3/13:  Chems- stable w/ BUN=27 Creat=1.1;  CBC- ok w/ Hg=15.7;  BNP= 171;  Protime- 32.2/ INR=2.9 & we called to CC to adjust down...  ~  Jun 30, 2011:  4mo ROV & post hosp check> Glenn Barron had Stent Graft repair of large left SFA/ popliteal art aneurysm 4/13 by Glenn Barron; he has done well post op- bruising resolved, swelling improving, etc- f/u CTA is planned soon;  He is still quite fragile & as prev noted he has other aneurysms- bilat hypogastrics, bilat common femorals, bilat popliteals...  We reviewed his prob list, meds, xrays & labs> see below>> he is back on Coumadin via CC...         Problem List:  OPHTHALMOLOGY - followed by [facility]... ~  7/12:  He reports that recent check up was OK...  ALLERGIC RHINITIS (ICD-477.9) - on OTC Antihist Prn and FLONASE Qhs...  BRONCHITIS, RECURRENT (ICD-491.9) - on ADVAIR 250Bid, PROVENTIL HFA Prn, & PREDNISONE 5mg  Qod... ~  CXR 7/11 showed some hyperinflation, no change scarring & vol loss RLL, NAD.Marland Kitchen. ~  CXR 11/11 in hosp showed scarring right base w/ incr opac r/o superimposed pneum > he was placed on Pred during this adm & weaned slowly as outpt... ~  CXR 12/11 here showed baseline film w/ basilar scarring & NAD... on Pred 10mg /d & asked to wean to 5mg /d... ~  Continued wean of Pred to 5mg  Qod & pt remains stable w/o change in dyspnea, & w/o resp exac... ~  CXR 4/13 showed thoracic Ao ectasia, COPD w/ fibrotic changes & atx right base, osteopenia/ DDD/ scoliosis, filter in IVC...  Hx of PULMONARY EMBOLISM (ICD-415.19) - he had a DVT w/ PTE in 2001 w/ IVC filter placed... he remains on COUMADIN w/ regular checks in the Coumadin Clinic- doing well.  HYPERTENSION  (ICD-401.9) - on LASIX monotherapy (currently 80mg /d) w/ K20/d... ~  labs 10/10 showed BUN= 17, Creat= 1.0, K= 4.7 ~  11/10: c/o incr dizzy & weak- rec to decr the Lasix to 1/2 tab daily... ~  2/11:  hosp records show norm CBC, BMet, TSH, Urine. ~  7/11: c/o incr edema & wants to incr Lasix to 1-2 Qam; BUN=28, Creat=1.2, K=4.2, BNP=97 ~  Labs 11/11 in hosp on Lasix 40mg /d showed BUN/ Creat= 29/ 1.5 ==> 18/ 0.9 & BNP= 113. ~  4/12:  On Lasix40mg - 1.5 tabsQam & BP= 118/72; notes some fatigue, intermittent dyspnea, dizziness, & edema; but denies HA, visual changes, CP, palipit, syncope, etc; labs show K=4.6, BUN=22, Creat=1.1, rec incr Lasix to 80mg  Qam... ~  7/12:  BP= 124/82 on Lasix 80mg /d... ~  11/12:  BP= 122/76 and he denies CP, palpit, dizzy, ch in SOB or edema... ~  2/13:  BP= 110/62 on Lasix 80Bid; denies CP, palpit, dizzy, ch in SOB/DOE, note decr in edema & wt. ~  5/13:  BP= 128/76 & he is improving slowly  PAROXYSMAL ATRIAL FIBRILLATION (ICD-427.31) - prev on Sotalol80 but stopped 12/10 by Glenn Barron...  remains on COUMADIN via clinic... he was prev maintaining NSR but has slipped back into AFib w/ controlled VR... ~  Last 2DEcho 2004 showed EF 50-55% w/ infer HK, incr AoV thickness, mild AI, mild LAdil, no thrombus seen... ~  EKG 12/10 showed SBrady w/ RBBB & 1st degree AVB... ~  6/11:  Last seen by Glenn Barron> he was still regular w/ pulse= 62... ~  Noted to be irreg 12/11 OV & clinical AFib w/ controlled VR...  CEREBROVASCULAR DISEASE (ICD-437.9) - on ASA 81mg /d in addition to the Coumadin...  ~  he is s/p right carotid endarterectomy 7/01 by Windell Moulding...  ~  MRI 8/04 showed intracranial atherosclerotic changes w/ marked ectasia of V-B sys, sm right vertebral w/ prox stenosis, & >50% left ICA stenosis...  ~  CT Brain 1/09 showed atrophy and chr microvacs ischemic changes & remote IC lacune... ~  CDoppler 5/09 showed patent right CAE w/ DPA, mild plaque in bulb, 0-39% bilat ICA stenoses-  no change... ~  CDoppler 5/10 showed patent right CAE w/ DPA, stable mild left carotid dis, 0-39% bilat- f/u 73yr.  PERIPHERAL VASCULAR DISEASE (ICD-443.9) - s/p AAA repair 8/00 & bilat Ao to iliac bypasses in 2001 by DrLawson> he had mult post-op complications and a long hosp course... ~  2011:  f/u vasc eval by Walker Kehr & referral to Glenn Barron for review> diffuse aneurysmal changes w/ 5cm left Pop art aneurysm- he was felt to be too high risk for surg & they rec BP control & observation only...  ~  2/13:  With loss of edema & decr 10# on Lasix80Bid> there is an indurated swollen area in the post aspect of left lower thigh above the pop fossa> pt referred to Glenn Barron to recheck pop art aneurysm... ~  4/13:  S/P stent graft left SFA/ Popliteal art by Glenn Barron...  VENOUS INSUFFICIENCY, CHRONIC (ICD-459.81) - chr ven insuffic due to his mult DVT episodes & prev IVC filter ?2001... he's had incr trouble w/ LE edema recently & requires incr Lasix dosing... ~  Venous Dopplers right leg 8/09 showed no evid for DVT, no superfic clots, etc... ~  8/09 developed cellulitis right leg which resolved to Keflex & local care... ~  [facility] 2/11 by [facility] for left hand cellulitis... ~  [facility] 11/11 by [facility] for right arm cellulitis... ~  2/12:  exam w/ persist incr LE edema w/ pitting> rec to incr LASIX 40==>80mg /d. ~  12/12: episode of LLE cellulitis w/ incr edema, therapeutic INR, treated w/ Doxy & incr Lasix... ~  1-2/13: cellulitis resolved, incr edema rx w/ incr Lasix & he lost 10# on Lasix80Bid...  HYPERLIPIDEMIA (ICD-272.4) - on LIPITOR 20mg /d & prev FLP at goal on this dose... ~  FLP 5/08 showed TChol 124, Tg 99, HDL 40, LDL 65 ~  FLP 5/09 showed TChol 120, TG 93, HDL 36,  LDL 66 ~  FLP 10/10 showed TChol 123, TG 96, HDL 47, LDL 57 ~  FLP 7/12 showed TChol 105, TG 69, HDL 55, LDL 36  GERD (ICD-530.81) - on PEPCID 20mg /d...  DIVERTICULOSIS OF COLON (ICD-562.10) IRRITABLE BOWEL SYNDROME (ICD-564.1) - he has diarrhea  (uses Immodium) & constipation (uses Miralax/ Senakot-S) w/ a tough job regulating (uses ALIGN)... COLONIC POLYPS (ICD-211.3) - last colonoscopy 8/99 showed divertics, no recurrent polyps... last polyps removed 1996= adenomatous... also had incidental cecal lipoma seen.  Hx of PROSTATE CANCER (ICD-185) - s/p radical prostatectomy in 1994... he is followed by DrWrenn & has urinary incontinence for which he uses pads etc... ~  labs 10/10 showed PSA= 0.03 ~  labs 7/11 showed PSA= 0.04 ~  Labs 7/12 showed PSA= 0.08  DEGENERATIVE JOINT DISEASE (ICD-715.90) - he has a known lumbar disc disease and cervical spondylosis... he also takes Vit D 1000 u daily...  ~  labs 7/11 showed Vit D level = 30... continue OTC supplement.  Hx of STROKE (ICD-434.91) - he was hospitalized 8/04 w/ stroke and MRI showed acute left pontine infarct + diffuse intracranial atherosclerotic changes- see above- on ASA & Coumadin.  PERIPHERAL NEUROPATHY (ICD-356.9) - he has LBP, neuropathy and a gait abnormality...  ANXIETY DEPRESSION (ICD-300.4) - wife, Kathie Rhodes, died 2022/07/01 on hospice (severe pulm fibrosis from scleroderma/ CREST/ etc)...   Past Surgical History  Procedure Date  . Prostateectomy   . Cataract extraction   . Carotid endarterectomy     right  . Abdominal aortic aneurysm repair   . Skin cancer excision   . Inguinal hernia repair 1991    bilateral    Outpatient Encounter Prescriptions as of 06/30/2011  Medication Sig Dispense Refill  . acetaminophen (TYLENOL) 650 MG CR tablet Take 650 mg by mouth 2 (two) times daily.      Marland Kitchen albuterol (PROAIR HFA) 108 (90 BASE) MCG/ACT inhaler Inhale 1-2 puffs into the lungs every 6 (six) hours as needed.  1 Inhaler  11  . aspirin 81 MG tablet Take 81 mg by mouth daily.        Marland Kitchen atorvastatin (LIPITOR) 20 MG tablet take 1 tablet by mouth once daily  30 tablet  5  . calcium carbonate (TUMS - DOSED IN MG ELEMENTAL CALCIUM) 500 MG chewable tablet As needed       .  cholecalciferol (VITAMIN D) 1000 UNITS tablet Take 1,000 Units by mouth daily.        . famotidine (PEPCID) 10 MG tablet Take 10 mg by mouth at bedtime.        . fluticasone (FLONASE) 50 MCG/ACT nasal spray instill 2 sprays into each nostril once daily  16 g  11  . Fluticasone-Salmeterol (ADVAIR DISKUS) 250-50 MCG/DOSE AEPB Inhale 1 puff into the lungs 2 (two) times daily. Rinse mouth well  60 each  11  . furosemide (LASIX) 40 MG tablet Take 2 tablets by mouth in the morning .      . polyethylene glycol (MIRALAX / GLYCOLAX) packet Take 17 g by mouth as needed.      . potassium chloride SA (K-DUR,KLOR-CON) 20 MEQ tablet Take 20 mEq by mouth daily.      . predniSONE (DELTASONE) 10 MG tablet Take 5 mg by mouth every other day.      Marland Kitchen PROBIOTIC CAPS Take 1 capsule by mouth daily.       Marland Kitchen warfarin (COUMADIN) 4 MG tablet Take 4-6 mg by mouth daily. Take 1.5 tablets (6mg )  on Monday and Friday. Take 1 tablet (4mg ) the rest of the week.        Allergies  Allergen Reactions  . Amoxicillin     REACTION: rash  . Doxycycline     Rash    Current Medications, Allergies, Past Medical History, Past Surgical History, Family History, and Social History were reviewed in [employer] record.   Review of Systems         See HPI - all other systems neg except as noted... The patient complains of decreased hearing, dyspnea on exertion, peripheral edema, muscle weakness, difficulty walking, and depression.  The patient denies anorexia, fever, weight loss, weight gain, vision loss, hoarseness, chest pain, syncope, prolonged cough, headaches, hemoptysis, abdominal pain, melena, hematochezia, severe indigestion/heartburn, hematuria, incontinence, suspicious skin lesions, transient blindness, unusual weight change, abnormal bleeding, enlarged lymph nodes, and angioedema.     Objective:   Physical Exam     WD, sl thin, 78 y/o WM in NAD... GENERAL:  Alert & oriented; pleasant &  cooperative... HEENT:  [place]/AT, EOM-wnl, PERRLA, EACs-clear, TMs-wnl, NOSE-clear, THROAT-clear & wnl. NECK:  Supple w/ fairROM; no JVD; s/p right CAE, no bruits; no thyromegaly or nodules palpated; no lymphadenopathy. CHEST: decr BS bilat- few rhonchi in RLL area, no consolidation, no wheezing... HEART:  sl irregular rhythm; w/o murmur, rubs, or gallop heard... ABDOMEN:  Soft & nontender; normal bowel sounds; no organomegaly or masses detected. EXT: without deformities, mod arthritic changes; ambulates w/ walker; +venous stasis, 2+edema noted. NEURO:  CN's intact;  gait abn; no focal neuro deficits,  +generalized weakness... DERM:  No lesions noted; no rash etc...  RADIOLOGY DATA:  Reviewed in the EPIC EMR & discussed w/ the patient...    >>XRay right foot 12/12 showed soft tissue swelling, no bone abn seen...  LABORATORY DATA:  Reviewed in the EPIC EMR & discussed w/ the patient...    >>Labs 12/12:  Chems- ok w/ BUN=19, Creat=1.0, BNP=237;  CBC w/ Hg=15.5, WBC=10.9;  Wound cult- neg, no staph etc...    >>Labs 2/13 showed:  Chems- ok w/ BUN=32, Creat=1.1, K=5.0, BNP=159;  CBC- ok w/ Hg=16.1, WBC=9.6.Marland KitchenMarland Kitchen We decided to decr the Lasix40 slightly to 2Qam & 1Qpm...  Plan ROV 6weeks.   Assessment & Plan:   HBP>  Remains under good control w/ his diet + Lasix monotherapy;  Last OV lasix incr to 80Bid & today we will decr slightly to 80mg AM & 40mg PM...  AFib>  Followed by Walker Kehr for cards, on Coumadin via clinic w/ therapeutic INR, controlled VR...  VASC DISEASE>  He has Cerebrovasc dis & Periph vasc dis followed by Walker Kehr & [facility]- Glenn Barron, Windell Moulding;  With wt loss & decr edema in LLE he now has palp mass/ indurated area behind the left lower thigh above the pop fossa & this expanding pop art aneurysm needs surg...   Venous Insuffic w/ Edema>  Improved after Lasix 80mg  Bid as noted; Labs reviewed & we decided to decr to 80mg Qam & 40mg Qpm...  Hx DVT w/ PTE in 2001 w/ IVC filter placed & on Coumadin  followed in the CC ever since...  Hyperlipidemia>  Controlled on Lip20 + diet efforts...  GI> GERD, Divertics, IBS, Polyps>  Stable on Pepcid, Miralax, Align...  GU> Hx prostate cancer, urinary incont>  Followed by DrWrenn...  Hx Stroke>  This occurred in 2004, on ASA & Coumadin...  Hx of Bell's Palsey>  S/p Right Bell's Palsey 3/12, treated w/ Valtrex & Dosepak & resolved back to baseline.Marland KitchenMarland Kitchen  Other medical problems as noted...     Patient's Medications  New Prescriptions   No medications on file  Previous Medications   ACETAMINOPHEN (TYLENOL) 650 MG CR TABLET    Take 650 mg by mouth 2 (two) times daily.   ALBUTEROL (PROAIR HFA) 108 (90 BASE) MCG/ACT INHALER    Inhale 1-2 puffs into the lungs every 6 (six) hours as needed.   ASPIRIN 81 MG TABLET    Take 81 mg by mouth daily.     ATORVASTATIN (LIPITOR) 20 MG TABLET    take 1 tablet by mouth once daily   CALCIUM CARBONATE (TUMS - DOSED IN MG ELEMENTAL CALCIUM) 500 MG CHEWABLE TABLET    As needed    CHOLECALCIFEROL (VITAMIN D) 1000 UNITS TABLET    Take 1,000 Units by mouth daily.     FAMOTIDINE (PEPCID) 10 MG TABLET    Take 10 mg by mouth at bedtime.     FLUTICASONE (FLONASE) 50 MCG/ACT NASAL SPRAY    instill 2 sprays into each nostril once daily   FLUTICASONE-SALMETEROL (ADVAIR DISKUS) 250-50 MCG/DOSE AEPB    Inhale 1 puff into the lungs 2 (two) times daily. Rinse mouth well   FUROSEMIDE (LASIX) 40 MG TABLET    Take 2 tablets by mouth in the morning .   POLYETHYLENE GLYCOL (MIRALAX / GLYCOLAX) PACKET    Take 17 g by mouth as needed.   POTASSIUM CHLORIDE SA (K-DUR,KLOR-CON) 20 MEQ TABLET    Take 20 mEq by mouth daily.   PREDNISONE (DELTASONE) 10 MG TABLET    Take 5 mg by mouth every other day.   PROBIOTIC CAPS    Take 1 capsule by mouth daily.    WARFARIN (COUMADIN) 4 MG TABLET    Take 4-6 mg by mouth daily. Take 1.5 tablets (6mg ) on Monday and Friday. Take 1 tablet (4mg ) the rest of the week.  Modified Medications   No medications on  file  Discontinued Medications   No medications on file

## 2011-07-08 ENCOUNTER — Ambulatory Visit (INDEPENDENT_AMBULATORY_CARE_PROVIDER_SITE_OTHER): Payer: Medicare Other

## 2011-07-08 DIAGNOSIS — I2699 Other pulmonary embolism without acute cor pulmonale: Secondary | ICD-10-CM

## 2011-07-08 DIAGNOSIS — Z7901 Long term (current) use of anticoagulants: Secondary | ICD-10-CM

## 2011-07-08 DIAGNOSIS — I82409 Acute embolism and thrombosis of unspecified deep veins of unspecified lower extremity: Secondary | ICD-10-CM

## 2011-07-08 DIAGNOSIS — I4891 Unspecified atrial fibrillation: Secondary | ICD-10-CM

## 2011-07-18 ENCOUNTER — Encounter: Payer: Self-pay | Admitting: Vascular Surgery

## 2011-07-19 ENCOUNTER — Ambulatory Visit
Admission: RE | Admit: 2011-07-19 | Discharge: 2011-07-19 | Disposition: A | Discharge status: Home / Self Care | Payer: Medicare Other | Source: Ambulatory Visit | Attending: Vascular Surgery | Admitting: Vascular Surgery

## 2011-07-19 ENCOUNTER — Encounter: Payer: Self-pay | Admitting: Vascular Surgery

## 2011-07-19 ENCOUNTER — Ambulatory Visit (INDEPENDENT_AMBULATORY_CARE_PROVIDER_SITE_OTHER): Payer: Medicare Other | Admitting: Vascular Surgery

## 2011-07-19 VITALS — BP 110/80 | HR 79 | Temp 98.1°F | Ht 71.0 in | Wt 169.0 lb

## 2011-07-19 DIAGNOSIS — I724 Aneurysm of artery of lower extremity: Secondary | ICD-10-CM

## 2011-07-19 DIAGNOSIS — Z48812 Encounter for surgical aftercare following surgery on the circulatory system: Secondary | ICD-10-CM

## 2011-07-19 DIAGNOSIS — I7092 Chronic total occlusion of artery of the extremities: Secondary | ICD-10-CM | POA: Insufficient documentation

## 2011-07-19 MED ORDER — IOHEXOL 350 MG/ML SOLN
150.0000 mL | Freq: Once | INTRAVENOUS | Status: AC | PRN
  Administered 2011-07-19: 150 mL via INTRAVENOUS

## 2011-07-19 NOTE — Progress Notes (Signed)
Patient has today for followup of his stent graft repair of large 9 cm distal superficial femoral artery and popliteal artery aneurysm. He has had no complications related to this. His main issue continues to be significant bilateral are trimmed the swelling attributable to his chronic congestive heart failure. He has no pain associated with the incision or repair.  Physical exam does have thickening in his left thigh he does not have an expansile mass as he did preoperatively. He does have a palpable popliteal pulse and bilateral pitting edema  CT scan reveals no evidence of endoleak. His aneurysm size is shrunk approximately 8.5 cm 9 cm preop. All the flows through the stent graft and on into the popliteal aneurysm. His remaining aneurysms are stable in size. He will continue his activities we will see him again in 6 months with ultrasound and then would plan to repeat CT scan in approximately one year

## 2011-07-20 NOTE — Addendum Note (Signed)
Addended by: Sharee Pimple on: 07/20/2011 10:18 AM   Modules accepted: Orders

## 2011-07-22 ENCOUNTER — Other Ambulatory Visit: Payer: Self-pay | Admitting: Pulmonary Disease

## 2011-07-25 ENCOUNTER — Other Ambulatory Visit: Payer: Self-pay | Admitting: Pulmonary Disease

## 2011-08-05 ENCOUNTER — Ambulatory Visit (INDEPENDENT_AMBULATORY_CARE_PROVIDER_SITE_OTHER): Payer: Medicare Other

## 2011-08-05 DIAGNOSIS — I4891 Unspecified atrial fibrillation: Secondary | ICD-10-CM

## 2011-08-05 DIAGNOSIS — I2699 Other pulmonary embolism without acute cor pulmonale: Secondary | ICD-10-CM

## 2011-08-05 DIAGNOSIS — Z7901 Long term (current) use of anticoagulants: Secondary | ICD-10-CM

## 2011-08-05 DIAGNOSIS — I82409 Acute embolism and thrombosis of unspecified deep veins of unspecified lower extremity: Secondary | ICD-10-CM

## 2011-08-05 LAB — POCT INR: INR: 3

## 2011-08-31 ENCOUNTER — Other Ambulatory Visit: Payer: Self-pay | Admitting: Pulmonary Disease

## 2011-09-02 ENCOUNTER — Ambulatory Visit (INDEPENDENT_AMBULATORY_CARE_PROVIDER_SITE_OTHER): Payer: Medicare Other | Admitting: Pharmacist

## 2011-09-02 DIAGNOSIS — I4891 Unspecified atrial fibrillation: Secondary | ICD-10-CM

## 2011-09-02 DIAGNOSIS — I82409 Acute embolism and thrombosis of unspecified deep veins of unspecified lower extremity: Secondary | ICD-10-CM

## 2011-09-02 DIAGNOSIS — I2699 Other pulmonary embolism without acute cor pulmonale: Secondary | ICD-10-CM

## 2011-09-02 DIAGNOSIS — Z7901 Long term (current) use of anticoagulants: Secondary | ICD-10-CM

## 2011-09-02 LAB — POCT INR: INR: 3

## 2011-09-15 ENCOUNTER — Other Ambulatory Visit: Payer: Self-pay | Admitting: Pulmonary Disease

## 2011-09-16 ENCOUNTER — Ambulatory Visit (INDEPENDENT_AMBULATORY_CARE_PROVIDER_SITE_OTHER): Payer: Medicare Other | Admitting: Pharmacist

## 2011-09-16 DIAGNOSIS — I82409 Acute embolism and thrombosis of unspecified deep veins of unspecified lower extremity: Secondary | ICD-10-CM

## 2011-09-16 DIAGNOSIS — Z7901 Long term (current) use of anticoagulants: Secondary | ICD-10-CM

## 2011-09-16 DIAGNOSIS — I2699 Other pulmonary embolism without acute cor pulmonale: Secondary | ICD-10-CM

## 2011-09-16 DIAGNOSIS — I4891 Unspecified atrial fibrillation: Secondary | ICD-10-CM

## 2011-09-27 ENCOUNTER — Encounter: Payer: Self-pay | Admitting: Pulmonary Disease

## 2011-09-27 ENCOUNTER — Ambulatory Visit (INDEPENDENT_AMBULATORY_CARE_PROVIDER_SITE_OTHER): Payer: Medicare Other | Admitting: Pulmonary Disease

## 2011-09-27 VITALS — BP 120/70 | HR 60 | Temp 98.1°F | Ht 72.0 in | Wt 173.6 lb

## 2011-09-27 DIAGNOSIS — J309 Allergic rhinitis, unspecified: Secondary | ICD-10-CM

## 2011-09-27 DIAGNOSIS — G609 Hereditary and idiopathic neuropathy, unspecified: Secondary | ICD-10-CM

## 2011-09-27 DIAGNOSIS — K5730 Diverticulosis of large intestine without perforation or abscess without bleeding: Secondary | ICD-10-CM

## 2011-09-27 DIAGNOSIS — M1990 Unspecified osteoarthritis, unspecified site: Secondary | ICD-10-CM

## 2011-09-27 DIAGNOSIS — I679 Cerebrovascular disease, unspecified: Secondary | ICD-10-CM

## 2011-09-27 DIAGNOSIS — I739 Peripheral vascular disease, unspecified: Secondary | ICD-10-CM

## 2011-09-27 DIAGNOSIS — F341 Dysthymic disorder: Secondary | ICD-10-CM

## 2011-09-27 DIAGNOSIS — K219 Gastro-esophageal reflux disease without esophagitis: Secondary | ICD-10-CM

## 2011-09-27 DIAGNOSIS — E785 Hyperlipidemia, unspecified: Secondary | ICD-10-CM

## 2011-09-27 DIAGNOSIS — I872 Venous insufficiency (chronic) (peripheral): Secondary | ICD-10-CM

## 2011-09-27 DIAGNOSIS — J42 Unspecified chronic bronchitis: Secondary | ICD-10-CM

## 2011-09-27 DIAGNOSIS — I4891 Unspecified atrial fibrillation: Secondary | ICD-10-CM

## 2011-09-27 DIAGNOSIS — I6350 Cerebral infarction due to unspecified occlusion or stenosis of unspecified cerebral artery: Secondary | ICD-10-CM

## 2011-09-27 DIAGNOSIS — C61 Malignant neoplasm of prostate: Secondary | ICD-10-CM

## 2011-09-27 DIAGNOSIS — I10 Essential (primary) hypertension: Secondary | ICD-10-CM

## 2011-09-27 NOTE — Patient Instructions (Addendum)
Today we updated your med list in our EPIC system...    Continue your current medications the same...  Glenn Barron, you are stable & holding your own... Keep up the good work...  Let's plan a follow up visit in 4 months w/ FASTING blood work at that time.Marland KitchenMarland Kitchen

## 2011-09-27 NOTE — Progress Notes (Signed)
Subjective:    Patient ID: Glenn Barron, male    DOB: 12/10/1922, 78 y.o.   MRN: [id number]  HPI 78 y/o WM here for a follow up visit... he has mult med problems as noted below... Followed for general medical purposes w/ hx of recurrent bronchitis & allergies; hx PTE/ DVT/ & IVC filter on Coumadin; HBP; AFib; cerebrovasc dis w/ hx stroke & prev right CAE; ASPVD w/ prev AAA repair & subseq bilat Ao-iliac bypasses; Hyperchol; Prostate cancer; neuropathy; and anxiety/ depression...  ~  March 22, 2010:   6 wk ROV- still weak but improving slowly> He has mult somatic complaints but overall appears stable;  he is in AFib clinically w/ controlled VR & HR=90, continues on Coumadin via CC;  he has persist LE edema w/ pitting & wt up 3#> on Lasix 40mg /d, states he's restricting sodium (but eating TV dinners), elevating legs, wearing support hose;  BP well controlled at 116/80 & we discussed 2gm sodium restriction & incr Lasix 40-80mg  daily...    Breathing is stable & he's been on Pred 10mg - 1/2 daily;  we discussed weaning to 5mg  Qod, continue Advair, etc...    He has a prob w/ his right hand> can't dorsiflex the 3rd-4th fingers & has appt w/ DrWeingold to eval...    He notes some constipation & we have rec Miralax daily if necessary for regulation, along w/ his Senakot-S, Align, etc...  ~  June 01, 2010:  63mo ROV- generally stable & gaining strength slowly (he has good exerc program, still has help at home [facility] per day);  He had right Bell's Palsey 3/12 & went to ER then called [place] & we Rx w/ Valtrex & Pred Dosepak ==> resolved & back to normal now;  Breathing stable on the Pred 5mg  Qod, BP looks good & edema is sl better w/ his incr to 60mg /d> we discussed checking BMet (K=4.6, BUN=22, Creat=1.1) and incr Lasix to 80mg  Qam;  CV stable w/o CP or cerebral ischemic symptoms...  We reviewed his meds & decided to continue others the same for now...  ~  September 01, 2010:  63mo ROV & he remains stable, had SHINGLES  vaccine 6/12 & we gave him the TDAP today; he follows a low sodium diet & will return for FASTING blood work next week... See prob list below>>  ~  January 04, 2011:  28mo ROV & he reports generally stable> cough & congestion improved, using MucinexDM; BP stable on Lasix+KCl; Cardiac stable, denies cerebral ischemic symptoms; labs from 7/12 reviewed; had skin cancer removed by Derm...  ~  February 22, 2011:  6wk ROV & add-on for f/u cellulitis> he saw TP 12/12 w/ acute swelling & redness right foot/ ankle/ leg, area is tender, no broken skin or trauma reported, no drainage, denies fever, no CP/ SOB, etc;  [name] already on Coumadin, followed in the CC & therapeutic, he had IVC filter placed yrs ago; Treated w/ Doxy po & infective symptoms improved> less red/ inflammed etc but still swollen- VI w/ edema up to his thigh on Lasix 40mg - 2Qam...  We decided to incr lasix to 80mg  Bid, 2gm Na+ diet (he's been eating TV dinners, soups, etc), elevation, etc... Plan f/u w/ labs in 40month.    Labs 12/12 showed Chems- ok w/ BUN=19, Creat=1.9, BNP=237;  CBC w/ Hg=15.5, WBC=10.9;  Wound cult- neg, no staph etc;  XRay right foot showed soft tissue swelling, no bone abn seen...  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     Labs today showed:  Chems- ok w/ BUN=32, Creat=1.1, K=5.0, BNP=159;  CBC- ok w/ Hg=16.1, WBC=9.6.Marland KitchenMarland Kitchen We decided to decr the Lasix40 slightly to 2Qam & 1Qpm...  Plan ROV 6weeks.  ~  May 03, 2011:  6week ROV & he has seen DrEarly w/ CT Angio done 2/13 w/ signif enlargement of aneurysm in the distal SFA & Pop art; mult other  aneurysms seen as well & several are bigger as well (see report); he has also had f/u DrNishan 3/13 & EKG shows AFib, rate74, RBBB, septal infarct & poor R progression... 2DEcho 3/13 showed mod LVH, norm LVF w/ EF=55-60%, mild AS, mild MR, mod LAdil measuring 52mm...  OK for surgery>> LABS 3/13:  Chems- stable w/ BUN=27 Creat=1.1;  CBC- ok w/ Hg=15.7;  BNP= 171;  Protime- 32.2/ INR=2.9 & we called to CC to adjust down...  ~  Jun 30, 2011:  61mo ROV & post hosp check> Kaushal had Stent Graft repair of large left SFA/ popliteal art aneurysm 4/13 by DrEarly; he has done well post op- bruising resolved, swelling improving, etc- f/u CTA is planned soon;  He is still quite fragile & as prev noted he has other aneurysms- bilat hypogastrics, bilat common femorals, bilat popliteals...  We reviewed his prob list, meds, xrays & labs> see below>> he is back on Coumadin via CC...  ~  September 27, 2011:  57mo ROV & [name] has been stable, notes that he had skin cancer removed from his left arm recently; his CC today are his "allergies" & he has OTC Zyrtek for prn use, plus Flonase & his Pred;  His leg swelling is decreased/ improved w/ 2+lft & 1+rt;  Other medical problems as noted below...    We reviewed prob list, meds, xrays and labs> see below for updates >>          Problem List:  OPHTHALMOLOGY - followed by [facility] eye clinic... ~  7/12:  He reports that recent check up was OK...  ALLERGIC RHINITIS (ICD-477.9) - on OTC Antihist Prn and FLONASE Qhs...  BRONCHITIS, RECURRENT (ICD-491.9) - on ADVAIR 250Bid, PROVENTIL HFA Prn, & PREDNISONE 5mg  Qod... ~  CXR 7/11 showed some hyperinflation, no change scarring & vol loss RLL, NAD.Marland Kitchen. ~  CXR 11/11 in hosp showed scarring right base w/ incr opac r/o superimposed pneum > he was placed on Pred during this adm & weaned slowly as outpt... ~  CXR 12/11 here showed baseline film w/ basilar scarring & NAD... on Pred 10mg /d & asked to wean to 5mg /d... ~  Continued wean of Pred  to 5mg  Qod & pt remains stable w/o change in dyspnea, & w/o resp exac... ~  CXR 4/13 showed thoracic Ao ectasia, COPD w/ fibrotic changes & atx right base, osteopenia/ DDD/ scoliosis, filter in IVC...  Hx of PULMONARY EMBOLISM (ICD-415.19) - he had a DVT w/ PTE in 2001 w/ IVC filter placed... he remains on COUMADIN w/ regular checks in the Coumadin Clinic- doing well.  HYPERTENSION (ICD-401.9) - on LASIX monotherapy (currently 80mg /d) w/  K20/d... ~  labs 10/10 showed BUN= 17, Creat= 1.0, K= 4.7 ~  11/10: c/o incr dizzy & weak- rec to decr the Lasix to 1/2 tab daily... ~  2/11:  hosp records show norm CBC, BMet, TSH, Urine. ~  7/11: c/o incr edema & wants to incr Lasix to 1-2 Qam; BUN=28, Creat=1.2, K=4.2, BNP=97 ~  Labs 11/11 in hosp on Lasix 40mg /d showed BUN/ Creat= 29/ 1.5 ==> 18/ 0.9 & BNP= 113. ~  4/12:  On Lasix40mg - 1.5 tabsQam & BP= 118/72; notes some fatigue, intermittent dyspnea, dizziness, & edema; but denies HA, visual changes, CP, palipit, syncope, etc; labs show K=4.6, BUN=22, Creat=1.1, rec incr Lasix to 80mg  Qam... ~  7/12:  BP= 124/82 on Lasix 80mg /d... ~  11/12:  BP= 122/76 and he denies CP, palpit, dizzy, ch in SOB or edema... ~  2/13:  BP= 110/62 on Lasix 80Bid; denies CP, palpit, dizzy, ch in SOB/DOE, note decr in edema & wt. ~  5/13:  BP= 128/76 & he is improving slowly  PAROXYSMAL ATRIAL FIBRILLATION (ICD-427.31) - prev on Sotalol80 but stopped 12/10 by DrNishan...  remains on COUMADIN via clinic... he was prev maintaining NSR but has slipped back into AFib w/ controlled VR... ~  Last 2DEcho 2004 showed EF 50-55% w/ infer HK, incr AoV thickness, mild AI, mild LAdil, no thrombus seen... ~  EKG 12/10 showed SBrady w/ RBBB & 1st degree AVB... ~  6/11:  Last seen by DrNishan> he was still regular w/ pulse= 62... ~  Noted to be irreg 12/11 OV & clinical AFib w/ controlled VR...  CEREBROVASCULAR DISEASE (ICD-437.9) - on ASA 81mg /d in addition to the Coumadin...  ~  he is s/p  right carotid endarterectomy 7/01 by Windell Moulding...  ~  MRI 8/04 showed intracranial atherosclerotic changes w/ marked ectasia of V-B sys, sm right vertebral w/ prox stenosis, & >50% left ICA stenosis...  ~  CT Brain 1/09 showed atrophy and chr microvacs ischemic changes & remote IC lacune... ~  CDoppler 5/09 showed patent right CAE w/ DPA, mild plaque in bulb, 0-39% bilat ICA stenoses- no change... ~  CDoppler 5/10 showed patent right CAE w/ DPA, stable mild left carotid dis, 0-39% bilat- f/u 40yr.  PERIPHERAL VASCULAR DISEASE (ICD-443.9) - s/p AAA repair 8/00 & bilat Ao to iliac bypasses in 2001 by DrLawson> he had mult post-op complications and a long hosp course... 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  ~  2/13:  With loss of edema & decr 10# on Lasix80Bid> there is an indurated swollen area in the post aspect of left lower thigh above the pop fossa> pt referred to DrEarly to recheck pop art aneurysm... ~  4/13:  S/P stent graft left SFA/ Popliteal art by DrEarly... ~  6/13:  He had f/u DrEarly w/ repeat CT showing no endoleak & aneurysm is sl smaller, they plan f/u 36mo...  VENOUS INSUFFICIENCY, CHRONIC (ICD-459.81) - chr ven insuffic due to his mult DVT episodes & prev IVC filter ?2001... he's had incr trouble w/ LE edema recently & requires incr Lasix dosing... ~  Venous Dopplers right leg 8/09 showed no evid for DVT, no superfic clots, etc... ~  8/09 developed cellulitis right leg which resolved to Keflex & local care... ~  [facility] 2/11 by [facility] for  left hand cellulitis... ~  [facility] 11/11 by [facility] for right arm cellulitis... ~  2/12:  exam w/ persist incr LE edema w/ pitting> rec to incr LASIX 40==>80mg /d. ~  12/12: episode of LLE cellulitis w/ incr edema, therapeutic INR, treated w/ Doxy & incr Lasix... ~  1-2/13: cellulitis  resolved, incr edema rx w/ incr Lasix & he lost 10# on Lasix80Bid...  HYPERLIPIDEMIA (ICD-272.4) - on LIPITOR 20mg /d & prev FLP at goal on this dose... ~  FLP 5/08 showed TChol 124, Tg 99, HDL 40, LDL 65 ~  FLP 5/09 showed TChol 120, TG 93, HDL 36, LDL 66 ~  FLP 10/10 showed TChol 123, TG 96, HDL 47, LDL 57 ~  FLP 7/12 showed TChol 105, TG 69, HDL 55, LDL 36  GERD (ICD-530.81) - on PEPCID 20mg /d...  DIVERTICULOSIS OF COLON (ICD-562.10) IRRITABLE BOWEL SYNDROME (ICD-564.1) - he has diarrhea (uses Immodium) & constipation (uses Miralax/ Senakot-S) w/ a tough job regulating (uses ALIGN)... COLONIC POLYPS (ICD-211.3) - last colonoscopy 8/99 showed divertics, no recurrent polyps... last polyps removed 1996= adenomatous... also had incidental cecal lipoma seen.  Hx of PROSTATE CANCER (ICD-185) - s/p radical prostatectomy in 1994... he is followed by DrWrenn & has urinary incontinence for which he uses pads etc... ~  labs 10/10 showed PSA= 0.03 ~  labs 7/11 showed PSA= 0.04 ~  Labs 7/12 showed PSA= 0.08  DEGENERATIVE JOINT DISEASE (ICD-715.90) - he has a known lumbar disc disease and cervical spondylosis... he also takes Vit D 1000 u daily...  ~  labs 7/11 showed Vit D level = 30... continue OTC supplement.  Hx of STROKE (ICD-434.91) - he was hospitalized 8/04 w/ stroke and MRI showed acute left pontine infarct + diffuse intracranial atherosclerotic changes- see above- on ASA & Coumadin.  PERIPHERAL NEUROPATHY (ICD-356.9) - he has LBP, neuropathy and a gait abnormality...  ANXIETY DEPRESSION (ICD-300.4) - wife, Kathie Rhodes, died 2022/06/24 on hospice (severe pulm fibrosis from scleroderma/ CREST/ etc)...   Past Surgical History  Procedure Date  . Prostateectomy   . Cataract extraction   . Carotid endarterectomy     right  . Abdominal aortic aneurysm repair   . Skin cancer excision   . Inguinal hernia repair 1991    bilateral    Outpatient Encounter Prescriptions as of 09/27/2011  Medication Sig  Dispense Refill  . acetaminophen (TYLENOL) 650 MG CR tablet Take 650 mg by mouth 2 (two) times daily.      Marland Kitchen ADVAIR DISKUS 250-50 MCG/DOSE AEPB inhale 1 puff INTO THE LUNGS twice a day RINSE MOUTH WELL  60 each  11  . aspirin 81 MG tablet Take 81 mg by mouth daily.        Marland Kitchen atorvastatin (LIPITOR) 20 MG tablet take 1 tablet by mouth once daily  30 tablet  1  . calcium carbonate (TUMS - DOSED IN MG ELEMENTAL CALCIUM) 500 MG chewable tablet As needed       . cholecalciferol (VITAMIN D) 1000 UNITS tablet Take 1,000 Units by mouth daily.        . famotidine (PEPCID) 10 MG tablet Take 10 mg by mouth at bedtime.        . fluticasone (FLONASE) 50 MCG/ACT nasal spray instill 2 sprays into each nostril once daily  16 g  11  . furosemide (LASIX) 40 MG tablet Take 2 tablets by mouth in the morning .      . polyethylene glycol (MIRALAX / GLYCOLAX) packet Take 17 g by mouth as  needed.      . potassium chloride SA (K-DUR,KLOR-CON) 20 MEQ tablet Take 20 mEq by mouth daily.      . predniSONE (DELTASONE) 10 MG tablet Take 5 mg by mouth every other day.      Marland Kitchen PROAIR HFA 108 (90 BASE) MCG/ACT inhaler INHALE 1 TO 2 PUFFS INTO THE LUNGS EVERY 6 HOURS AS NEEDED.  8.5 g  11  . PROBIOTIC CAPS Take 1 capsule by mouth daily.       Marland Kitchen warfarin (COUMADIN) 4 MG tablet TAKE AS DIRECTED BY COUMADIN CLINIC  100 tablet  2  . DISCONTD: warfarin (COUMADIN) 4 MG tablet Take 4-6 mg by mouth daily. Take 1.5 tablets (6mg ) on Monday and Friday. Take 1 tablet (4mg ) the rest of the week.        Allergies  Allergen Reactions  . Amoxicillin     REACTION: rash  . Doxycycline     Rash    Current Medications, Allergies, Past Medical History, Past Surgical History, Family History, and Social History were reviewed in [employer] record.   Review of Systems         See HPI - all other systems neg except as noted... The patient complains of decreased hearing, dyspnea on exertion, peripheral edema, muscle  weakness, difficulty walking, and depression.  The patient denies anorexia, fever, weight loss, weight gain, vision loss, hoarseness, chest pain, syncope, prolonged cough, headaches, hemoptysis, abdominal pain, melena, hematochezia, severe indigestion/heartburn, hematuria, incontinence, suspicious skin lesions, transient blindness, unusual weight change, abnormal bleeding, enlarged lymph nodes, and angioedema.     Objective:   Physical Exam     WD, sl thin, 78 y/o WM in NAD... GENERAL:  Alert & oriented; pleasant & cooperative... HEENT:  [place]/AT, EOM-wnl, PERRLA, EACs-clear, TMs-wnl, NOSE-clear, THROAT-clear & wnl. NECK:  Supple w/ fairROM; no JVD; s/p right CAE, no bruits; no thyromegaly or nodules palpated; no lymphadenopathy. CHEST: decr BS bilat- few rhonchi in RLL area, no consolidation, no wheezing... HEART:  sl irregular rhythm; w/o murmur, rubs, or gallop heard... ABDOMEN:  Soft & nontender; normal bowel sounds; no organomegaly or masses detected. EXT: without deformities, mod arthritic changes; ambulates w/ walker; +venous stasis, 2+edema noted. NEURO:  CN's intact;  gait abn; no focal neuro deficits,  +generalized weakness... DERM:  No lesions noted; no rash etc...  RADIOLOGY DATA:  Reviewed in the EPIC EMR & discussed w/ the patient...    >>XRay right foot 12/12 showed soft tissue swelling, no bone abn seen...  LABORATORY DATA:  Reviewed in the EPIC EMR & discussed w/ the patient...    >>Labs 12/12:  Chems- ok w/ BUN=19, Creat=1.0, BNP=237;  CBC w/ Hg=15.5, WBC=10.9;  Wound cult- neg, no staph etc...    >>Labs 2/13 showed:  Chems- ok w/ BUN=32, Creat=1.1, K=5.0, BNP=159;  CBC- ok w/ Hg=16.1, WBC=9.6.Marland KitchenMarland Kitchen We decided to decr the Lasix40 slightly to 2Qam & 1Qpm...  Plan ROV 6weeks.   Assessment & Plan:   HBP>  Remains under good control w/ his diet + Lasix monotherapy;  Last OV lasix incr to 80Bid & today we will decr slightly to 80mg AM & 40mg PM...  AFib>  Followed by Walker Kehr for  cards, on Coumadin via clinic w/ therapeutic INR, controlled VR...  VASC DISEASE>  He has Cerebrovasc dis & Periph vasc dis followed by Walker Kehr & [facility]- DrEarly, Windell Moulding;  With wt loss & decr edema in LLE he now has palp mass/ indurated area behind the left lower thigh above the  pop fossa & this expanding pop art aneurysm needs surg...   Venous Insuffic w/ Edema>  Improved after Lasix 80mg  Bid as noted; Labs reviewed & we decided to decr to 80mg Qam & 40mg Qpm...  Hx DVT w/ PTE in 2001 w/ IVC filter placed & on Coumadin followed in the CC ever since...  Hyperlipidemia>  Controlled on Lip20 + diet efforts...  GI> GERD, Divertics, IBS, Polyps>  Stable on Pepcid, Miralax, Align...  GU> Hx prostate cancer, urinary incont>  Followed by DrWrenn...  Hx Stroke>  This occurred in 2004, on ASA & Coumadin...  Hx of Bell's Palsey>  S/p Right Bell's Palsey 3/12, treated w/ Valtrex & Dosepak & resolved back to baseline...  Other medical problems as noted...     Patient's Medications  New Prescriptions   No medications on file  Previous Medications   ACETAMINOPHEN (TYLENOL) 650 MG CR TABLET    Take 650 mg by mouth 2 (two) times daily.   ADVAIR DISKUS 250-50 MCG/DOSE AEPB    inhale 1 puff INTO THE LUNGS twice a day RINSE MOUTH WELL   ASPIRIN 81 MG TABLET    Take 81 mg by mouth daily.     ATORVASTATIN (LIPITOR) 20 MG TABLET    take 1 tablet by mouth once daily   CALCIUM CARBONATE (TUMS - DOSED IN MG ELEMENTAL CALCIUM) 500 MG CHEWABLE TABLET    As needed    CHOLECALCIFEROL (VITAMIN D) 1000 UNITS TABLET    Take 1,000 Units by mouth daily.     FAMOTIDINE (PEPCID) 10 MG TABLET    Take 10 mg by mouth at bedtime.     FLUTICASONE (FLONASE) 50 MCG/ACT NASAL SPRAY    instill 2 sprays into each nostril once daily   FUROSEMIDE (LASIX) 40 MG TABLET    Take 2 tablets by mouth in the morning .   POLYETHYLENE GLYCOL (MIRALAX / GLYCOLAX) PACKET    Take 17 g by mouth as needed.   POTASSIUM CHLORIDE SA  (K-DUR,KLOR-CON) 20 MEQ TABLET    Take 20 mEq by mouth daily.   PREDNISONE (DELTASONE) 10 MG TABLET    Take 5 mg by mouth every other day.   PROAIR HFA 108 (90 BASE) MCG/ACT INHALER    INHALE 1 TO 2 PUFFS INTO THE LUNGS EVERY 6 HOURS AS NEEDED.   PROBIOTIC CAPS    Take 1 capsule by mouth daily.    WARFARIN (COUMADIN) 4 MG TABLET    TAKE AS DIRECTED BY COUMADIN CLINIC  Modified Medications   No medications on file  Discontinued Medications   WARFARIN (COUMADIN) 4 MG TABLET    Take 4-6 mg by mouth daily. Take 1.5 tablets (6mg ) on Monday and Friday. Take 1 tablet (4mg ) the rest of the week.

## 2011-10-06 ENCOUNTER — Ambulatory Visit (INDEPENDENT_AMBULATORY_CARE_PROVIDER_SITE_OTHER): Payer: Medicare Other | Admitting: *Deleted

## 2011-10-06 DIAGNOSIS — I82409 Acute embolism and thrombosis of unspecified deep veins of unspecified lower extremity: Secondary | ICD-10-CM

## 2011-10-06 DIAGNOSIS — I4891 Unspecified atrial fibrillation: Secondary | ICD-10-CM

## 2011-10-06 DIAGNOSIS — I2699 Other pulmonary embolism without acute cor pulmonale: Secondary | ICD-10-CM

## 2011-10-06 DIAGNOSIS — Z7901 Long term (current) use of anticoagulants: Secondary | ICD-10-CM

## 2011-10-06 LAB — POCT INR: INR: 2.2

## 2011-11-01 ENCOUNTER — Other Ambulatory Visit: Payer: Self-pay | Admitting: Pulmonary Disease

## 2011-11-03 ENCOUNTER — Ambulatory Visit (INDEPENDENT_AMBULATORY_CARE_PROVIDER_SITE_OTHER): Payer: Medicare Other | Admitting: *Deleted

## 2011-11-03 DIAGNOSIS — I2699 Other pulmonary embolism without acute cor pulmonale: Secondary | ICD-10-CM

## 2011-11-03 DIAGNOSIS — Z7901 Long term (current) use of anticoagulants: Secondary | ICD-10-CM

## 2011-11-03 DIAGNOSIS — I4891 Unspecified atrial fibrillation: Secondary | ICD-10-CM

## 2011-11-03 DIAGNOSIS — I82409 Acute embolism and thrombosis of unspecified deep veins of unspecified lower extremity: Secondary | ICD-10-CM

## 2011-11-03 LAB — POCT INR: INR: 2.3

## 2011-12-01 ENCOUNTER — Ambulatory Visit (INDEPENDENT_AMBULATORY_CARE_PROVIDER_SITE_OTHER): Payer: Medicare Other | Admitting: *Deleted

## 2011-12-01 DIAGNOSIS — I2699 Other pulmonary embolism without acute cor pulmonale: Secondary | ICD-10-CM

## 2011-12-01 DIAGNOSIS — Z7901 Long term (current) use of anticoagulants: Secondary | ICD-10-CM

## 2011-12-01 DIAGNOSIS — I82409 Acute embolism and thrombosis of unspecified deep veins of unspecified lower extremity: Secondary | ICD-10-CM

## 2011-12-01 DIAGNOSIS — I4891 Unspecified atrial fibrillation: Secondary | ICD-10-CM

## 2012-01-11 ENCOUNTER — Ambulatory Visit (INDEPENDENT_AMBULATORY_CARE_PROVIDER_SITE_OTHER): Payer: Medicare Other | Admitting: *Deleted

## 2012-01-11 DIAGNOSIS — I82409 Acute embolism and thrombosis of unspecified deep veins of unspecified lower extremity: Secondary | ICD-10-CM

## 2012-01-11 DIAGNOSIS — I2699 Other pulmonary embolism without acute cor pulmonale: Secondary | ICD-10-CM

## 2012-01-11 DIAGNOSIS — I4891 Unspecified atrial fibrillation: Secondary | ICD-10-CM

## 2012-01-11 DIAGNOSIS — Z7901 Long term (current) use of anticoagulants: Secondary | ICD-10-CM

## 2012-01-17 ENCOUNTER — Encounter: Payer: Self-pay | Admitting: Neurosurgery

## 2012-01-18 ENCOUNTER — Encounter: Payer: Self-pay | Admitting: Neurosurgery

## 2012-01-18 ENCOUNTER — Encounter (INDEPENDENT_AMBULATORY_CARE_PROVIDER_SITE_OTHER): Payer: Medicare Other | Admitting: *Deleted

## 2012-01-18 ENCOUNTER — Ambulatory Visit: Payer: Medicare Other | Admitting: Neurosurgery

## 2012-01-18 ENCOUNTER — Ambulatory Visit (INDEPENDENT_AMBULATORY_CARE_PROVIDER_SITE_OTHER): Payer: Medicare Other | Admitting: Neurosurgery

## 2012-01-18 VITALS — BP 113/63 | HR 61 | Resp 18 | Ht 72.0 in | Wt 165.0 lb

## 2012-01-18 DIAGNOSIS — Z48812 Encounter for surgical aftercare following surgery on the circulatory system: Secondary | ICD-10-CM

## 2012-01-18 DIAGNOSIS — M7989 Other specified soft tissue disorders: Secondary | ICD-10-CM

## 2012-01-18 DIAGNOSIS — I7092 Chronic total occlusion of artery of the extremities: Secondary | ICD-10-CM

## 2012-01-18 DIAGNOSIS — I724 Aneurysm of artery of lower extremity: Secondary | ICD-10-CM

## 2012-01-18 DIAGNOSIS — I714 Abdominal aortic aneurysm, without rupture: Secondary | ICD-10-CM

## 2012-01-18 NOTE — Progress Notes (Signed)
[facility] Office Note  CC: Popliteal and AAA surveillance Referring Physician: Early  History of Present Illness: 78 year old male patient of Dr. 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 The patient states his left lower extremity is doing fine he has no pain, he has had a stroke which affected his right side and he states his right lower extremity has been weak since that time and has not gotten worse. The patient denies any signs of stroke now and has no rest pain and does not complain of claudication with ambulation although he states he cannot walk far due to the weakness from the stroke. He does use a walker for ambulation.   Past Medical History  Diagnosis Date  . Allergic rhinitis, cause unspecified   . Unspecified chronic bronchitis   . Other pulmonary embolism and infarction   . Paroxysmal a-fib   . Cerebrovascular disease, unspecified 10/2002    right side weakness  . Peripheral vascular disease, unspecified   . Unspecified venous (peripheral) insufficiency   . Acute venous embolism and thrombosis of unspecified deep vessels of lower extremity   . Hyperlipidemia   . GERD (gastroesophageal reflux disease)   . Diverticulosis of colon (without mention of hemorrhage)   . Irritable bowel syndrome   . Benign neoplasm of colon   . Malignant neoplasm of prostate   . Osteoarthrosis, unspecified whether generalized or localized, unspecified site   . Unspecified cerebral artery occlusion with cerebral infarction   . Unspecified hereditary and idiopathic peripheral neuropathy   . Depression   . Hypertension     Dr. Eden Emms cardiologist   . Right bundle branch block   . Asthma   . History of scarlet fever   . Aortic stenosis, mild     mild by echo 04/2011    ROS: [x]  Positive   [ ]  Denies    General: [ ]  Weight loss, [ ]  Fever, [ ]   chills Neurologic: [ ]  Dizziness, [ ]  Blackouts, [ ]  Seizure [ ]  Stroke, [ ]  "Mini stroke", [ ]  Slurred speech, [ ]  Temporary blindness; [ ]  weakness in arms or legs, [ ]  Hoarseness Cardiac: [ ]  Chest pain/pressure, [ ]  Shortness of breath at rest [ ]  Shortness of breath with exertion, [ ]  Atrial fibrillation or irregular heartbeat Vascular: [ ]  Pain in legs with walking, [ ]  Pain in legs at rest, [ ]  Pain in legs at night,  [ ]  Non-healing ulcer, [ ]  Blood clot in vein/DVT,   Pulmonary: [ ]  Home oxygen, [ ]  Productive cough, [ ]  Coughing up blood, [ ]  Asthma,  [ ]  Wheezing Musculoskeletal:  [ ]  Arthritis, [ ]  Low back pain, [ ]  Joint pain Hematologic: [ ]  Easy Bruising, [ ]  Anemia; [ ]  Hepatitis Gastrointestinal: [ ]  Blood in stool, [ ]  Gastroesophageal Reflux/heartburn, [ ]  Trouble swallowing Urinary: [ ]  chronic Kidney disease, [ ]  on HD - [ ]  MWF or [ ]  TTHS, [ ]  Burning with urination, [ ]  Difficulty urinating Skin: [ ]  Rashes, [ ]  Wounds Psychological: [ ]  Anxiety, [ ]  Depression   Social History History  Substance Use Topics  . Smoking status: Never Smoker   . Smokeless tobacco: Never Used  . Alcohol Use: No    Family History Family History  Problem Relation Age of Onset  . Anesthesia problems Neg Hx   . Heart  attack Father   . Hyperlipidemia Mother   . Hypertension Mother   . Hypertension Sister     Allergies  Allergen Reactions  . Amoxicillin     REACTION: rash  . Doxycycline     Rash    Current Outpatient Prescriptions  Medication Sig Dispense Refill  . acetaminophen (TYLENOL) 650 MG CR tablet Take 650 mg by mouth 2 (two) times daily.      Marland Kitchen ADVAIR DISKUS 250-50 MCG/DOSE AEPB inhale 1 puff INTO THE LUNGS twice a day RINSE MOUTH WELL  60 each  11  . aspirin 81 MG tablet Take 81 mg by mouth daily.        Marland Kitchen atorvastatin (LIPITOR) 20 MG tablet take 1 tablet by mouth once daily  30 tablet  11  . calcium carbonate (TUMS - DOSED IN MG ELEMENTAL CALCIUM) 500 MG  chewable tablet As needed       . cholecalciferol (VITAMIN D) 1000 UNITS tablet Take 1,000 Units by mouth daily.        . famotidine (PEPCID) 10 MG tablet Take 10 mg by mouth at bedtime.        . fluticasone (FLONASE) 50 MCG/ACT nasal spray instill 2 sprays into each nostril once daily  16 g  11  . furosemide (LASIX) 40 MG tablet Take 2 tablets by mouth in the morning .      . polyethylene glycol (MIRALAX / GLYCOLAX) packet Take 17 g by mouth as needed.      . potassium chloride SA (K-DUR,KLOR-CON) 20 MEQ tablet Take 20 mEq by mouth daily.      . predniSONE (DELTASONE) 10 MG tablet Take 5 mg by mouth every other day.      Marland Kitchen PROAIR HFA 108 (90 BASE) MCG/ACT inhaler INHALE 1 TO 2 PUFFS INTO THE LUNGS EVERY 6 HOURS AS NEEDED.  8.5 g  11  . PROBIOTIC CAPS Take 1 capsule by mouth daily.       Marland Kitchen warfarin (COUMADIN) 4 MG tablet TAKE AS DIRECTED BY COUMADIN CLINIC  100 tablet  2    Physical Examination  Filed Vitals:   01/18/12 1049  BP: 113/63  Pulse: 61  Resp: 18    Body mass index is 22.38 kg/(m^2).  General:  WDWN in NAD Gait: Normal HEENT: WNL Eyes: Pupils equal Pulmonary: normal non-labored breathing , without Rales, rhonchi,  wheezing Cardiac: RRR, without  Murmurs, rubs or gallops; No carotid bruits Abdomen: soft, NT, no masses Skin: no rashes, ulcers noted Vascular Exam/Pulses: Femoral pulses are palpable, left lower extremity pulses are palpable, no carotid bruits are heard, there is no abdominal mass palpated  Extremities without ischemic changes, no Gangrene , no cellulitis; no open wounds;  Musculoskeletal: no muscle wasting or atrophy  Neurologic: A&O X 3; Appropriate Affect ; SENSATION: normal; MOTOR FUNCTION:  moving all extremities equally. Speech is fluent/normal  Non-Invasive Vascular Imaging: Lower extremity duplex shows a patent left lower extremity stent in his ABIs today are 1.01 and monophasic to biphasic on the left noncompressible on the right which is  unchanged from previous exam  ASSESSMENT/PLAN: Stable patient with history of aneurysm repair, I did verify with Dr. Edilia Bo that per Dr. Bosie Helper last note he does want a CT of his AAA for his next visit. That'll be set up at the patient's convenience. We will also obtain ABIs when he returns in 6 months to see Dr. Arbie Cookey. The patient's questions were encouraged and answered, he is in agreement with this  plan.  Lauree Chandler ANP  Clinic M.D.: Edilia Bo

## 2012-01-18 NOTE — Addendum Note (Signed)
Addended by: Sharee Pimple on: 01/18/2012 03:23 PM   Modules accepted: Orders

## 2012-01-20 ENCOUNTER — Other Ambulatory Visit: Payer: Self-pay | Admitting: Pulmonary Disease

## 2012-01-20 ENCOUNTER — Telehealth: Payer: Self-pay | Admitting: Pulmonary Disease

## 2012-01-20 MED ORDER — FUROSEMIDE 40 MG PO TABS: ORAL_TABLET | ORAL | Status: DC

## 2012-01-20 NOTE — Telephone Encounter (Signed)
Refill has been sent to the pharmacy. Nothing further is needed. 

## 2012-01-23 ENCOUNTER — Encounter: Payer: Self-pay | Admitting: *Deleted

## 2012-01-23 ENCOUNTER — Other Ambulatory Visit: Payer: Self-pay | Admitting: Pulmonary Disease

## 2012-01-23 ENCOUNTER — Telehealth: Payer: Self-pay | Admitting: Pulmonary Disease

## 2012-01-23 DIAGNOSIS — I2699 Other pulmonary embolism without acute cor pulmonale: Secondary | ICD-10-CM

## 2012-01-23 DIAGNOSIS — C61 Malignant neoplasm of prostate: Secondary | ICD-10-CM

## 2012-01-23 DIAGNOSIS — F419 Anxiety disorder, unspecified: Secondary | ICD-10-CM

## 2012-01-23 DIAGNOSIS — F341 Dysthymic disorder: Secondary | ICD-10-CM

## 2012-01-23 DIAGNOSIS — K5730 Diverticulosis of large intestine without perforation or abscess without bleeding: Secondary | ICD-10-CM

## 2012-01-23 DIAGNOSIS — D126 Benign neoplasm of colon, unspecified: Secondary | ICD-10-CM

## 2012-01-23 DIAGNOSIS — I10 Essential (primary) hypertension: Secondary | ICD-10-CM

## 2012-01-23 DIAGNOSIS — E785 Hyperlipidemia, unspecified: Secondary | ICD-10-CM

## 2012-01-23 NOTE — Telephone Encounter (Signed)
I spoke with pt and he is requesting to have his lab work done in the AM and then he will come back for his APPT tomorrow afternoon. He would like to go ahead and have his labs placed in the computer. Please advise SN thanks Last OV 09/27/11 Oda Cogan 12/1

## 2012-01-23 NOTE — Telephone Encounter (Signed)
Called and spoke with pt and he is aware of labs in the computer for him in the morning and he will come back for his appt tomorrow afternoon.

## 2012-01-24 ENCOUNTER — Ambulatory Visit (INDEPENDENT_AMBULATORY_CARE_PROVIDER_SITE_OTHER): Payer: Medicare Other | Admitting: Pulmonary Disease

## 2012-01-24 ENCOUNTER — Other Ambulatory Visit (INDEPENDENT_AMBULATORY_CARE_PROVIDER_SITE_OTHER): Payer: Medicare Other

## 2012-01-24 ENCOUNTER — Encounter: Payer: Self-pay | Admitting: Pulmonary Disease

## 2012-01-24 VITALS — BP 114/70 | HR 71 | Temp 98.2°F | Ht 72.0 in | Wt 173.2 lb

## 2012-01-24 DIAGNOSIS — I10 Essential (primary) hypertension: Secondary | ICD-10-CM

## 2012-01-24 DIAGNOSIS — K589 Irritable bowel syndrome without diarrhea: Secondary | ICD-10-CM

## 2012-01-24 DIAGNOSIS — K5730 Diverticulosis of large intestine without perforation or abscess without bleeding: Secondary | ICD-10-CM

## 2012-01-24 DIAGNOSIS — J309 Allergic rhinitis, unspecified: Secondary | ICD-10-CM

## 2012-01-24 DIAGNOSIS — C61 Malignant neoplasm of prostate: Secondary | ICD-10-CM

## 2012-01-24 DIAGNOSIS — I679 Cerebrovascular disease, unspecified: Secondary | ICD-10-CM

## 2012-01-24 DIAGNOSIS — E785 Hyperlipidemia, unspecified: Secondary | ICD-10-CM

## 2012-01-24 DIAGNOSIS — I2699 Other pulmonary embolism without acute cor pulmonale: Secondary | ICD-10-CM

## 2012-01-24 DIAGNOSIS — I872 Venous insufficiency (chronic) (peripheral): Secondary | ICD-10-CM

## 2012-01-24 DIAGNOSIS — I4891 Unspecified atrial fibrillation: Secondary | ICD-10-CM

## 2012-01-24 DIAGNOSIS — G609 Hereditary and idiopathic neuropathy, unspecified: Secondary | ICD-10-CM

## 2012-01-24 DIAGNOSIS — F411 Generalized anxiety disorder: Secondary | ICD-10-CM

## 2012-01-24 DIAGNOSIS — I739 Peripheral vascular disease, unspecified: Secondary | ICD-10-CM

## 2012-01-24 DIAGNOSIS — K219 Gastro-esophageal reflux disease without esophagitis: Secondary | ICD-10-CM

## 2012-01-24 DIAGNOSIS — F419 Anxiety disorder, unspecified: Secondary | ICD-10-CM

## 2012-01-24 DIAGNOSIS — J42 Unspecified chronic bronchitis: Secondary | ICD-10-CM

## 2012-01-24 DIAGNOSIS — I6350 Cerebral infarction due to unspecified occlusion or stenosis of unspecified cerebral artery: Secondary | ICD-10-CM

## 2012-01-24 DIAGNOSIS — D126 Benign neoplasm of colon, unspecified: Secondary | ICD-10-CM

## 2012-01-24 DIAGNOSIS — F341 Dysthymic disorder: Secondary | ICD-10-CM

## 2012-01-24 LAB — CBC WITH DIFFERENTIAL/PLATELET
Basophils Relative: 0.6 % (ref 0.0–3.0)
Eosinophils Absolute: 0.2 10*3/uL (ref 0.0–0.7)
Eosinophils Relative: 1.7 % (ref 0.0–5.0)
HCT: 49.7 % (ref 39.0–52.0)
Lymphs Abs: 4 10*3/uL (ref 0.7–4.0)
MCHC: 33 g/dL (ref 30.0–36.0)
MCV: 98.9 fl (ref 78.0–100.0)
Monocytes Absolute: 1.2 10*3/uL — ABNORMAL HIGH (ref 0.1–1.0)
Neutro Abs: 6.2 10*3/uL (ref 1.4–7.7)
Neutrophils Relative %: 53.4 % (ref 43.0–77.0)
RBC: 5.02 Mil/uL (ref 4.22–5.81)

## 2012-01-24 LAB — LIPID PANEL
LDL Cholesterol: 46 mg/dL (ref 0–99)
Total CHOL/HDL Ratio: 2
Triglycerides: 85 mg/dL (ref 0.0–149.0)
VLDL: 17 mg/dL (ref 0.0–40.0)

## 2012-01-24 LAB — BASIC METABOLIC PANEL
Chloride: 100 mEq/L (ref 96–112)
Creatinine, Ser: 1.2 mg/dL (ref 0.4–1.5)
GFR: 59.09 mL/min — ABNORMAL LOW (ref >60.00)
Potassium: 4.5 mEq/L (ref 3.5–5.1)

## 2012-01-24 LAB — PSA: PSA: 0.1 ng/mL (ref 0.10–4.00)

## 2012-01-24 LAB — HEPATIC FUNCTION PANEL
ALT: 21 U/L (ref 0–53)
Bilirubin, Direct: 0.3 mg/dL (ref 0.0–0.3)
Total Bilirubin: 2 mg/dL — ABNORMAL HIGH (ref 0.3–1.2)

## 2012-01-24 NOTE — Patient Instructions (Addendum)
Today we updated your med list in our EPIC system...    Continue your current medications the same...  Today we reviewed your recent lab data...  Call for any problems or concerns...  Let's plan another follow up visit in 6 months.Marland KitchenMarland Kitchen

## 2012-01-24 NOTE — Progress Notes (Signed)
Subjective:    Patient ID: Glenn Barron, male    DOB: 11-24-1922, 78 y.o.   MRN: [id number]  HPI 78 y/o WM here for a follow up visit... he has mult med problems as noted below... Followed for general medical purposes w/ hx of recurrent bronchitis & allergies; hx PTE/ DVT/ & IVC filter on Coumadin; HBP; AFib; cerebrovasc dis w/ hx stroke & prev right CAE; ASPVD w/ prev AAA repair & subseq bilat Ao-iliac bypasses; Hyperchol; Prostate cancer; neuropathy; and anxiety/ depression...  ~  February 22, 2011:  6wk ROV & add-on for f/u cellulitis> he saw TP 12/12 w/ acute swelling & redness right foot/ ankle/ leg, area is tender, no broken skin or trauma reported, no drainage, denies fever, no CP/ SOB, etc;  [name] already on Coumadin, followed in the CC & therapeutic, he had IVC filter placed yrs ago; Treated w/ Doxy po & infective symptoms improved> less red/ inflammed etc but still swollen- VI w/ edema up to his thigh on Lasix 40mg - 2Qam...  We decided to incr lasix to 80mg  Bid, 2gm Na+ diet (he's been eating TV dinners, soups, etc), elevation, etc... Plan f/u w/ labs in 24month.    Labs 12/12 showed Chems- ok w/ BUN=19, Creat=1.9, BNP=237;  CBC w/ Hg=15.5, WBC=10.9;  Wound cult- neg, no staph etc;  XRay right foot showed soft tissue swelling, no bone abn seen...  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     Labs today showed:  Chems- ok w/ BUN=32, Creat=1.1, K=5.0, BNP=159;  CBC- ok w/ Hg=16.1, WBC=9.6.Marland KitchenMarland Kitchen We decided to decr the Lasix40 slightly to 2Qam & 1Qpm...  Plan ROV 6weeks.  ~  May 03, 2011:  6week ROV & he has seen  DrEarly w/ CT Angio done 2/13 w/ signif enlargement of aneurysm in the distal SFA & Pop art; mult other aneurysms seen as well & several are bigger as well (see report); he has also had f/u DrNishan 3/13 & EKG shows AFib, rate74, RBBB, septal infarct & poor R progression... 2DEcho 3/13 showed mod LVH, norm LVF w/ EF=55-60%, mild AS, mild MR, mod LAdil measuring 52mm...  OK for surgery>> LABS 3/13:  Chems- stable w/ BUN=27 Creat=1.1;  CBC- ok w/ Hg=15.7;  BNP= 171;  Protime- 32.2/ INR=2.9 & we called to CC to adjust down...  ~  Jun 30, 2011:  28mo ROV & post hosp check> Glenn Barron had Stent Graft repair of large left SFA/ popliteal art aneurysm 4/13 by DrEarly; he has done well post op- bruising resolved, swelling improving, etc- f/u CTA is planned soon;  He is still quite fragile & as prev noted he has other aneurysms- bilat hypogastrics, bilat common femorals, bilat popliteals...  We reviewed his prob list, meds, xrays & labs> see below>> he is back on Coumadin via CC...  ~  September 27, 2011:  81mo ROV & Glenn Barron has been stable, notes that he had skin cancer removed from his left arm recently; his CC today are his "allergies" & he has OTC Zyrtek for prn use, plus Flonase &  his Pred;  His leg swelling is decreased/ improved w/ 2+lft & 1+rt;  Other medical problems as noted below...    We reviewed prob list, meds, xrays and labs> see below for updates >>  ~  January 24, 2012:  12mo ROV & Glenn Barron is overall stable- he had ROV w/ Belva Crome 12/13 w/ dopplers showing patent stent graft to left leg;  His breathing is stable on Advair250 & Pred- 5mg  Qod;  BP looks good & measures 114/70 on Lasix80+K20Bid;  FLP is well controlled on his diet & Lip20;  Finally GI is managed well w/ Pepcid, Miralax, Probiotics...    reviewed prob list, meds, xrays and labs> see below for updates >>  LABS 12/13:  FLP- at goals on Lip20;  Chems- ok w/ Creat=1.2;  CBC- wnl;  TSH=3.56;  PSA=0.10         Problem List:  OPHTHALMOLOGY -  followed by [facility]... ~  7/12:  He reports that recent check up was OK...  ALLERGIC RHINITIS (ICD-477.9) - on OTC Antihist Prn and FLONASE Qhs...  BRONCHITIS, RECURRENT (ICD-491.9) - on ADVAIR 250Bid, PROVENTIL HFA Prn, & PREDNISONE 5mg  Qod... ~  CXR 7/11 showed some hyperinflation, no change scarring & vol loss RLL, NAD.Marland Kitchen. ~  CXR 11/11 in hosp showed scarring right base w/ incr opac r/o superimposed pneum > he was placed on Pred during this adm & weaned slowly as outpt... ~  CXR 12/11 here showed baseline film w/ basilar scarring & NAD... on Pred 10mg /d & asked to wean to 5mg /d... ~  Continued wean of Pred to 5mg  Qod & pt remains stable w/o change in dyspnea, & w/o resp exac... ~  CXR 4/13 showed thoracic Ao ectasia, COPD w/ fibrotic changes & atx right base, osteopenia/ DDD/ scoliosis, filter in IVC...  Hx of PULMONARY EMBOLISM (ICD-415.19) - he had a DVT w/ PTE in 2001 w/ IVC filter placed... he remains on COUMADIN w/ regular checks in the Coumadin Clinic- doing well.  HYPERTENSION (ICD-401.9) - on LASIX monotherapy (currently 80mg /d) w/ K20/d... ~  labs 10/10 showed BUN= 17, Creat= 1.0, K= 4.7 ~  11/10: c/o incr dizzy & weak- rec to decr the Lasix to 1/2 tab daily... ~  2/11:  hosp records show norm CBC, BMet, TSH, Urine. ~  7/11: c/o incr edema & wants to incr Lasix to 1-2 Qam; BUN=28, Creat=1.2, K=4.2, BNP=97 ~  Labs 11/11 in hosp on Lasix 40mg /d showed BUN/ Creat= 29/ 1.5 ==> 18/ 0.9 & BNP= 113. ~  4/12:  On Lasix40mg - 1.5 tabsQam & BP= 118/72; notes some fatigue, intermittent dyspnea, dizziness, & edema; but denies HA, visual changes, CP, palipit, syncope, etc; labs show K=4.6, BUN=22, Creat=1.1, rec incr Lasix to 80mg  Qam... ~  7/12:  BP= 124/82 on Lasix 80mg /d... ~  11/12:  BP= 122/76 and he denies CP, palpit, dizzy, ch in SOB or edema... ~  2/13:  BP= 110/62 on Lasix 80Bid; denies CP, palpit, dizzy, ch in SOB/DOE, note decr in edema & wt. ~  5/13:  BP= 128/76 & he is  improving slowly  PAROXYSMAL ATRIAL FIBRILLATION (ICD-427.31) - prev on Sotalol80 but stopped 12/10 by DrNishan...  remains on COUMADIN via clinic... he was prev maintaining NSR but has slipped back into AFib w/ controlled VR... ~  Last 2DEcho 2004 showed EF 50-55% w/ infer HK, incr AoV thickness, mild AI, mild LAdil, no thrombus seen... ~  EKG 12/10 showed SBrady w/ RBBB & 1st degree AVB... ~  6/11:  Last seen by DrNishan> he was still regular w/ pulse= 62... ~  2/12: he is in AFib clinically w/ controlled VR & HR=90, continues on Coumadin via CC  CEREBROVASCULAR DISEASE (ICD-437.9) - on ASA 81mg /d in addition to the Coumadin...  ~  he is s/p right carotid endarterectomy 7/01 by Windell Moulding...  ~  MRI 8/04 showed intracranial atherosclerotic changes w/ marked ectasia of V-B sys, sm right vertebral w/ prox stenosis, & >50% left ICA stenosis...  ~  CT Brain 1/09 showed atrophy and chr microvacs ischemic changes & remote IC lacune... ~  CDoppler 5/09 showed patent right CAE w/ DPA, mild plaque in bulb, 0-39% bilat ICA stenoses- no change... ~  CDoppler 5/10 showed patent right CAE w/ DPA, stable mild left carotid dis, 0-39% bilat- f/u 78yr.  PERIPHERAL VASCULAR DISEASE (ICD-443.9) - s/p AAA repair 8/00 & bilat Ao to iliac bypasses in 2001 by DrLawson> he had mult post-op complications and a long hosp course... 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  ~  2/13:  With loss of edema & decr 10# on Lasix80Bid> there is an indurated swollen area in the post aspect of left lower thigh above the pop fossa> pt referred to DrEarly to recheck pop art aneurysm... ~  4/13:  S/P stent graft repair of large 9cm left SFA/ Popliteal art aneurysm by DrEarly... 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 ~  12/13:  F/u by [facility]- Art Dopplers revealed patent left stent graft w/ normal ABI; calcif vessels noted on right...  VENOUS INSUFFICIENCY, CHRONIC (ICD-459.81) - chr ven insuffic due to his mult DVT episodes & prev IVC filter ?2001... he's had incr trouble w/ LE edema recently & requires incr Lasix dosing... ~  Venous Dopplers right leg 8/09 showed no evid for DVT, no superfic clots, etc... ~  8/09 developed cellulitis right leg which resolved to Keflex & local care... ~  [facility] 2/11 by [facility] for left hand cellulitis... ~  [facility] 11/11 by [facility] for right arm cellulitis... ~  2/12:  exam w/ persist incr LE edema w/ pitting> rec to incr LASIX 40==>80mg /d. ~  12/12: episode of LLE cellulitis w/ incr edema, therapeutic INR, treated w/ Doxy & incr Lasix... ~  1-2/13: cellulitis resolved, incr edema rx w/ incr Lasix & he lost 10# on Lasix80Bid...  HYPERLIPIDEMIA (ICD-272.4) - on LIPITOR 20mg /d & prev FLP at goal on this dose... ~  FLP 5/08 showed TChol 124, Tg 99, HDL 40, LDL 65 ~  FLP 5/09 showed TChol 120, TG 93, HDL 36, LDL 66 ~  FLP 10/10 showed TChol 123, TG 96, HDL 47, LDL 57 ~  FLP 7/12 on Lip20 showed TChol 105, TG 69, HDL 55, LDL 36 ~  FLP 12/13 on Lip20 showed TChol 116, TG 85, HDL 53, LDL 46  GERD (ICD-530.81) - on PEPCID 20mg /d...  DIVERTICULOSIS OF COLON (ICD-562.10) IRRITABLE BOWEL SYNDROME (ICD-564.1) - he has diarrhea (uses Immodium) & constipation (uses Miralax/ Senakot-S) w/ a tough job regulating (uses ALIGN).Marland KitchenMarland Kitchen  COLONIC POLYPS (ICD-211.3) - last colonoscopy 8/99 showed divertics, no recurrent polyps... last polyps removed 1996= adenomatous... also had incidental cecal lipoma seen.  Hx of PROSTATE CANCER (ICD-185) - s/p radical prostatectomy in  1994... he is followed by DrWrenn & has urinary incontinence for which he uses pads etc... ~  labs 10/10 showed PSA= 0.03 ~  labs 7/11 showed PSA= 0.04 ~  Labs 7/12 showed PSA= 0.08  DEGENERATIVE JOINT DISEASE (ICD-715.90) - he has a known lumbar disc disease and cervical spondylosis... he also takes Vit D 1000 u daily...  ~  labs 7/11 showed Vit D level = 30... continue OTC supplement.  Hx of STROKE (ICD-434.91) - he was hospitalized 8/04 w/ stroke and MRI showed acute left pontine infarct + diffuse intracranial atherosclerotic changes- see above- on ASA & Coumadin. ~  He had right Bell's Palsey 3/12 & went to ER then called [place] & we Rx w/ Valtrex & Pred Dosepak ==> resolved & back to normal...  PERIPHERAL NEUROPATHY (ICD-356.9) - he has LBP, neuropathy and a gait abnormality...  ANXIETY DEPRESSION (ICD-300.4) - wife, Kathie Rhodes, died 06/26/22 on hospice (severe pulm fibrosis from scleroderma/ CREST/ etc)...  HEALTH MAINTENANCE: he had SHINGLES vaccine 6/12 & we gave him the TDAP 7/12...   Past Surgical History  Procedure Date  . Prostateectomy   . Cataract extraction   . Carotid endarterectomy     right  . Abdominal aortic aneurysm repair   . Skin cancer excision   . Inguinal hernia repair 1991    bilateral    Outpatient Encounter Prescriptions as of 01/24/2012  Medication Sig Dispense Refill  . acetaminophen (TYLENOL) 650 MG CR tablet Take 650 mg by mouth 2 (two) times daily.      Marland Kitchen ADVAIR DISKUS 250-50 MCG/DOSE AEPB inhale 1 puff INTO THE LUNGS twice a day RINSE MOUTH WELL  60 each  11  . aspirin 81 MG tablet Take 81 mg by mouth daily.        Marland Kitchen atorvastatin (LIPITOR) 20 MG tablet take 1 tablet by mouth once daily  30 tablet  11  . calcium carbonate (TUMS - DOSED IN MG ELEMENTAL CALCIUM) 500 MG chewable tablet As needed       . cholecalciferol (VITAMIN D) 1000 UNITS tablet Take 1,000 Units by mouth daily.        . famotidine (PEPCID) 10 MG tablet Take 10 mg by mouth at bedtime.         . fluticasone (FLONASE) 50 MCG/ACT nasal spray instill 2 sprays into each nostril once daily  16 g  11  . furosemide (LASIX) 40 MG tablet Take 2 tablets by mouth in the morning .      Marland Kitchen KLOR-CON M20 20 MEQ tablet take 1 tablet by mouth twice a day  60 each  5  . polyethylene glycol (MIRALAX / GLYCOLAX) packet Take 17 g by mouth as needed.      . predniSONE (DELTASONE) 10 MG tablet Take 5 mg by mouth every other day.      Marland Kitchen PROAIR HFA 108 (90 BASE) MCG/ACT inhaler INHALE 1 TO 2 PUFFS INTO THE LUNGS EVERY 6 HOURS AS NEEDED.  8.5 g  11  . PROBIOTIC CAPS Take 1 capsule by mouth daily.       Marland Kitchen warfarin (COUMADIN) 4 MG tablet TAKE AS DIRECTED BY COUMADIN CLINIC  100 tablet  2  . [DISCONTINUED] furosemide (LASIX) 40 MG tablet Take 2 tablets in the morning and 2 tablets in  the evening.  120 tablet  6    Allergies  Allergen Reactions  . Amoxicillin     REACTION: rash  . Doxycycline     Rash    Current Medications, Allergies, Past Medical History, Past Surgical History, Family History, and Social History were reviewed in [employer] record.   Review of Systems         See HPI - all other systems neg except as noted... The patient complains of decreased hearing, dyspnea on exertion, peripheral edema, muscle weakness, difficulty walking, and depression.  The patient denies anorexia, fever, weight loss, weight gain, vision loss, hoarseness, chest pain, syncope, prolonged cough, headaches, hemoptysis, abdominal pain, melena, hematochezia, severe indigestion/heartburn, hematuria, incontinence, suspicious skin lesions, transient blindness, unusual weight change, abnormal bleeding, enlarged lymph nodes, and angioedema.     Objective:   Physical Exam     WD, sl thin, 78 y/o WM in NAD... GENERAL:  Alert & oriented; pleasant & cooperative... HEENT:  Wright/AT, EOM-wnl, PERRLA, EACs-clear, TMs-wnl, NOSE-clear, THROAT-clear & wnl. NECK:  Supple w/ fairROM; no JVD; s/p right CAE, no  bruits; no thyromegaly or nodules palpated; no lymphadenopathy. CHEST: decr BS bilat- few rhonchi in RLL area, no consolidation, no wheezing... HEART:  sl irregular rhythm; w/o murmur, rubs, or gallop heard... ABDOMEN:  Soft & nontender; normal bowel sounds; no organomegaly or masses detected. EXT: without deformities, mod arthritic changes; ambulates w/ walker; +venous stasis, 2+edema noted. NEURO:  CN's intact;  gait abn; no focal neuro deficits,  +generalized weakness... DERM:  No lesions noted; no rash etc...  RADIOLOGY DATA:  Reviewed in the EPIC EMR & discussed w/ the patient...    >>XRay right foot 12/12 showed soft tissue swelling, no bone abn seen...  LABORATORY DATA:  Reviewed in the EPIC EMR & discussed w/ the patient...    >>Labs 12/12:  Chems- ok w/ BUN=19, Creat=1.0, BNP=237;  CBC w/ Hg=15.5, WBC=10.9;  Wound cult- neg, no staph etc...    >>Labs 2/13 showed:  Chems- ok w/ BUN=32, Creat=1.1, K=5.0, BNP=159;  CBC- ok w/ Hg=16.1, WBC=9.6.Marland KitchenMarland Kitchen We decided to decr the Lasix40 slightly to 2Qam & 1Qpm...  Plan ROV 6weeks.   Assessment & Plan:    HBP>  Remains under good control w/ his diet + Lasix monotherapy;  Last OV lasix incr to 80Bid & today we will decr slightly to 80mg AM & 40mg PM...  AFib>  Followed by Walker Kehr for cards, on Coumadin via clinic w/ therapeutic INR, controlled VR...  VASC DISEASE>  He has Cerebrovasc dis & Periph vasc dis followed by Walker Kehr & [facility]- DrEarly, Windell Moulding;  With wt loss & decr edema in LLE he now has palp mass/ indurated area behind the left lower thigh above the pop fossa & this expanding pop art aneurysm needs surg...   Venous Insuffic w/ Edema>  Improved after Lasix 80mg  Bid as noted; Labs reviewed & we decided to decr to 80mg Qam & 40mg Qpm...  Hx DVT w/ PTE in 2001 w/ IVC filter placed & on Coumadin followed in the CC ever since...  Hyperlipidemia>  Controlled on Lip20 + diet efforts...  GI> GERD, Divertics, IBS, Polyps>  Stable on Pepcid,  Miralax, Align...  GU> Hx prostate cancer, urinary incont>  Followed by DrWrenn...  Hx Stroke>  This occurred in 2004, on ASA & Coumadin...  Hx of Bell's Palsey>  S/p Right Bell's Palsey 3/12, treated w/ Valtrex & Dosepak & resolved back to baseline...  Other medical problems as noted.Marland KitchenMarland Kitchen  Patient's Medications  New Prescriptions   No medications on file  Previous Medications   ACETAMINOPHEN (TYLENOL) 650 MG CR TABLET    Take 650 mg by mouth 2 (two) times daily.   ADVAIR DISKUS 250-50 MCG/DOSE AEPB    inhale 1 puff INTO THE LUNGS twice a day RINSE MOUTH WELL   ASPIRIN 81 MG TABLET    Take 81 mg by mouth daily.     ATORVASTATIN (LIPITOR) 20 MG TABLET    take 1 tablet by mouth once daily   CALCIUM CARBONATE (TUMS - DOSED IN MG ELEMENTAL CALCIUM) 500 MG CHEWABLE TABLET    As needed    CHOLECALCIFEROL (VITAMIN D) 1000 UNITS TABLET    Take 1,000 Units by mouth daily.     FAMOTIDINE (PEPCID) 10 MG TABLET    Take 10 mg by mouth at bedtime.     FLUTICASONE (FLONASE) 50 MCG/ACT NASAL SPRAY    instill 2 sprays into each nostril once daily   FUROSEMIDE (LASIX) 40 MG TABLET    Take 2 tablets by mouth in the morning .   KLOR-CON M20 20 MEQ TABLET    take 1 tablet by mouth twice a day   POLYETHYLENE GLYCOL (MIRALAX / GLYCOLAX) PACKET    Take 17 g by mouth as needed.   PREDNISONE (DELTASONE) 10 MG TABLET    Take 5 mg by mouth every other day.   PROAIR HFA 108 (90 BASE) MCG/ACT INHALER    INHALE 1 TO 2 PUFFS INTO THE LUNGS EVERY 6 HOURS AS NEEDED.   PROBIOTIC CAPS    Take 1 capsule by mouth daily.    WARFARIN (COUMADIN) 4 MG TABLET    TAKE AS DIRECTED BY COUMADIN CLINIC  Modified Medications   No medications on file  Discontinued Medications   FUROSEMIDE (LASIX) 40 MG TABLET    Take 2 tablets in the morning and 2 tablets in the evening.

## 2012-02-22 ENCOUNTER — Ambulatory Visit (INDEPENDENT_AMBULATORY_CARE_PROVIDER_SITE_OTHER): Payer: Medicare Other | Admitting: Pharmacist

## 2012-02-22 DIAGNOSIS — Z7901 Long term (current) use of anticoagulants: Secondary | ICD-10-CM

## 2012-02-22 DIAGNOSIS — I82409 Acute embolism and thrombosis of unspecified deep veins of unspecified lower extremity: Secondary | ICD-10-CM

## 2012-02-22 DIAGNOSIS — I4891 Unspecified atrial fibrillation: Secondary | ICD-10-CM

## 2012-02-22 DIAGNOSIS — I2699 Other pulmonary embolism without acute cor pulmonale: Secondary | ICD-10-CM

## 2012-03-02 ENCOUNTER — Ambulatory Visit (INDEPENDENT_AMBULATORY_CARE_PROVIDER_SITE_OTHER): Payer: Medicare Other | Admitting: Cardiovascular Disease

## 2012-03-02 ENCOUNTER — Encounter: Payer: Self-pay | Admitting: Cardiovascular Disease

## 2012-03-02 VITALS — BP 151/89 | HR 73 | Wt 165.0 lb

## 2012-03-02 DIAGNOSIS — I4891 Unspecified atrial fibrillation: Secondary | ICD-10-CM

## 2012-03-02 DIAGNOSIS — E785 Hyperlipidemia, unspecified: Secondary | ICD-10-CM

## 2012-03-02 DIAGNOSIS — I10 Essential (primary) hypertension: Secondary | ICD-10-CM

## 2012-03-02 DIAGNOSIS — I739 Peripheral vascular disease, unspecified: Secondary | ICD-10-CM

## 2012-03-02 DIAGNOSIS — I872 Venous insufficiency (chronic) (peripheral): Secondary | ICD-10-CM

## 2012-03-02 NOTE — Assessment & Plan Note (Signed)
Cholesterol is at goal.  Continue current dose of statin and diet Rx.  No myalgias or side effects.  F/U  LFT's in 6 months. Lab Results  Component Value Date   LDLCALC 46 01/24/2012

## 2012-03-02 NOTE — Assessment & Plan Note (Signed)
Continue current dose of lasix.  Previous IVC filter

## 2012-03-02 NOTE — Patient Instructions (Signed)
Your physician wants you to follow-up in:  6 MONTHS WITH DR NISHAN  You will receive a reminder letter in the mail two months in advance. If you don't receive a letter, please call our office to schedule the follow-up appointment. Your physician recommends that you continue on your current medications as directed. Please refer to the Current Medication list given to you today. 

## 2012-03-02 NOTE — Assessment & Plan Note (Signed)
Well controlled.  Continue current medications and low sodium Dash type diet.    

## 2012-03-02 NOTE — Progress Notes (Signed)
Patient ID: Glenn Barron, male   DOB: 08-23-1922, 79 y.o.   MRN: [id number] Glenn Barron is seen today for F/U of multiple issues. He had a tough year and his wife passed a year ago  He has home care helping a lot now. He has a history of PAF, TIA, AAA with Aobifem. He is still seeing the coumadin clinic. His HTN and lipids are under good control. He is still depressed about his wifes death. Sotolol  stopped He has SSS with relative bradycardia and some wenkebach on ECG. He has some dizzyness but no frank syncope and seems to ambulated marginally with a walker. He does not want a pacer and currently there is not a firm indication for one  Has had an expanding left popliteal aneurysm  That was repaired by Dr Arbie Cookey using Stent graft  with Emeline Darling Viabahn stent graft in 4/13  Has afib with chronic coumadin. Can be held 4-5 days before with no bridge. Has had IVC filter 12 years ago or so for post op DVT and particular care will need to be given to lovenox and coumadin after surgery.  ROS: Denies fever, malais, weight loss, blurry vision, decreased visual acuity, cough, sputum, SOB, hemoptysis, pleuritic pain, palpitaitons, heartburn, abdominal pain, melena, lower extremity edema, claudication, or rash.  All other systems reviewed and negative  General: Affect appropriate Chronically ill male in wheel chair HEENT: normal Neck supple with no adenopathy JVP normal no bruits no thyromegaly Lungs clear with no wheezing and good diaphragmatic motion Heart:  S1/S2 no murmur, no rub, gallop or click PMI normal Abdomen: benighn, BS positve, no tenderness, no AAA no bruit.  No HSM or HJR Distal pulses intact with no bruits palpable stent behind left popliteal Plus one bilateral edema Neuro non-focal Skin warm and dry No muscular weakness   Current Outpatient Prescriptions  Medication Sig Dispense Refill  . acetaminophen (TYLENOL) 650 MG CR tablet Take 650 mg by mouth 2 (two) times daily.      Marland Kitchen ADVAIR  DISKUS 250-50 MCG/DOSE AEPB inhale 1 puff INTO THE LUNGS twice a day RINSE MOUTH WELL  60 each  11  . aspirin 81 MG tablet Take 81 mg by mouth daily.        Marland Kitchen atorvastatin (LIPITOR) 20 MG tablet take 1 tablet by mouth once daily  30 tablet  11  . calcium carbonate (TUMS - DOSED IN MG ELEMENTAL CALCIUM) 500 MG chewable tablet As needed       . cholecalciferol (VITAMIN D) 1000 UNITS tablet Take 1,000 Units by mouth daily.        . famotidine (PEPCID) 10 MG tablet Take 10 mg by mouth at bedtime.        . fluticasone (FLONASE) 50 MCG/ACT nasal spray instill 2 sprays into each nostril once daily  16 g  11  . furosemide (LASIX) 40 MG tablet Take 2 tablets by mouth in the morning .      Marland Kitchen KLOR-CON M20 20 MEQ tablet take 1 tablet by mouth twice a day  60 each  5  . polyethylene glycol (MIRALAX / GLYCOLAX) packet Take 17 g by mouth as needed.      . predniSONE (DELTASONE) 10 MG tablet Take 5 mg by mouth every other day.      Marland Kitchen PROAIR HFA 108 (90 BASE) MCG/ACT inhaler INHALE 1 TO 2 PUFFS INTO THE LUNGS EVERY 6 HOURS AS NEEDED.  8.5 g  11  . PROBIOTIC CAPS Take 1 capsule  by mouth daily.       Marland Kitchen warfarin (COUMADIN) 4 MG tablet TAKE AS DIRECTED BY COUMADIN CLINIC  100 tablet  2    Allergies  Amoxicillin and Doxycycline  Electrocardiogram: 04/27/11  Afib rate 74 RBBB   Assessment and Plan

## 2012-03-02 NOTE — Assessment & Plan Note (Signed)
Chronic afib on coumadin  SSS and no AV nodal blocking drugs.  No indication for pacer  Chronic RBBB

## 2012-03-02 NOTE — Assessment & Plan Note (Signed)
Recent stent graft repair of left popliteal aneurysm F/U CT with no endoleak  F/U Dr Arbie Cookey

## 2012-04-04 ENCOUNTER — Ambulatory Visit (INDEPENDENT_AMBULATORY_CARE_PROVIDER_SITE_OTHER): Payer: Medicare Other | Admitting: *Deleted

## 2012-04-04 DIAGNOSIS — I4891 Unspecified atrial fibrillation: Secondary | ICD-10-CM

## 2012-04-04 DIAGNOSIS — Z7901 Long term (current) use of anticoagulants: Secondary | ICD-10-CM

## 2012-04-24 ENCOUNTER — Telehealth: Payer: Self-pay | Admitting: Pulmonary Disease

## 2012-04-24 MED ORDER — LEVOFLOXACIN 500 MG PO TABS: 500.0000 mg | ORAL_TABLET | Freq: Every day | ORAL | Status: AC

## 2012-04-24 MED ORDER — HYDROCODONE-HOMATROPINE 5-1.5 MG/5ML PO SYRP: 5.0000 mL | ORAL_SOLUTION | ORAL | Status: DC | PRN

## 2012-04-24 NOTE — Telephone Encounter (Signed)
Spoke with pt.  C/o cough with a lot of foamy, gray to yellow mucus depending on his position, cough is worse after eating, "noise" in chest, PND, runny nose, watery eyes, and discomfort under left eye in am.  Denies fever or sore throat.  Taking albuterol with some relief and has used mucinex "at times" with not much relief.  Requesting abx and Dr. Jodelle Green recs on an allergy med -- ? claritin or claritin d.  Dr. Kriste Basque, pls advise.  Thank you.    Last OV with SN:  01/20/12; asked to f/u in 6 months [facility] Allergies  Allergen Reactions  . Amoxicillin     REACTION: rash  . Doxycycline     Rash

## 2012-04-24 NOTE — Telephone Encounter (Signed)
lmomtcb x1 

## 2012-04-24 NOTE — Telephone Encounter (Signed)
Per SN---   Call in levaquin 500 mg  #7  1 daily mucinex Fluids hydromet  #6oz  1 tsp every 4 hours prn cough.

## 2012-04-24 NOTE — Telephone Encounter (Signed)
Spoke with pt and notified of recs per SN He verbalized understanding and states nothing further needed Rxs were called to pharm

## 2012-04-30 ENCOUNTER — Telehealth: Payer: Self-pay | Admitting: Pulmonary Disease

## 2012-04-30 NOTE — Telephone Encounter (Signed)
I spoke with pt. He stated his cough is not better, feels tired, no apetite, weak. He will finish levaquin today. Pt scheduled OV with TP tomorrow at 2:15. Nothing further was needed

## 2012-05-01 ENCOUNTER — Ambulatory Visit (INDEPENDENT_AMBULATORY_CARE_PROVIDER_SITE_OTHER): Payer: Medicare Other | Admitting: Adult Health

## 2012-05-01 ENCOUNTER — Ambulatory Visit (INDEPENDENT_AMBULATORY_CARE_PROVIDER_SITE_OTHER)
Admission: RE | Admit: 2012-05-01 | Discharge: 2012-05-01 | Disposition: A | Discharge status: Home / Self Care | Payer: Medicare Other | Source: Ambulatory Visit | Attending: Adult Health | Admitting: Adult Health

## 2012-05-01 ENCOUNTER — Encounter: Payer: Self-pay | Admitting: Adult Health

## 2012-05-01 VITALS — BP 110/72 | HR 82 | Temp 98.6°F | Ht 74.0 in | Wt 175.8 lb

## 2012-05-01 DIAGNOSIS — J209 Acute bronchitis, unspecified: Secondary | ICD-10-CM

## 2012-05-01 DIAGNOSIS — J42 Unspecified chronic bronchitis: Secondary | ICD-10-CM

## 2012-05-01 DIAGNOSIS — J309 Allergic rhinitis, unspecified: Secondary | ICD-10-CM

## 2012-05-01 MED ORDER — LEVALBUTEROL HCL 0.63 MG/3ML IN NEBU
0.6300 mg | INHALATION_SOLUTION | Freq: Once | RESPIRATORY_TRACT | Status: AC
  Administered 2012-05-01: 0.63 mg via RESPIRATORY_TRACT

## 2012-05-01 NOTE — Progress Notes (Signed)
Subjective:    Patient ID: Glenn Barron, male    DOB: 08/03/1922, 79 y.o.   MRN: [id number]  HPI 79 y/o WM >Followed for general medical purposes w/ hx of recurrent bronchitis & allergies; hx PTE/ DVT/ & IVC filter on Coumadin; HBP; AFib; cerebrovasc dis w/ hx stroke & prev right CAE; ASPVD w/ prev AAA repair & subseq bilat Ao-iliac bypasses; Hyperchol; Prostate cancer; neuropathy; and anxiety/ depression...  ~  February 22, 2011:  6wk ROV & add-on for f/u cellulitis> he saw Glenn Barron 12/12 w/ acute swelling & redness right foot/ ankle/ leg, area is tender, no broken skin or trauma reported, no drainage, denies fever, no CP/ SOB, etc;  [name] already on Coumadin, followed in the CC & therapeutic, he had IVC filter placed yrs ago; Treated w/ Doxy po & infective symptoms improved> less red/ inflammed etc but still swollen- VI w/ edema up to his thigh on Lasix 40mg - 2Qam...  We decided to incr lasix to 80mg  Bid, 2gm Na+ diet (he's been eating TV dinners, soups, etc), elevation, etc... Plan f/u w/ labs in 59month.    Labs 12/12 showed Chems- ok w/ BUN=19, Creat=1.9, BNP=237;  CBC w/ Hg=15.5, WBC=10.9;  Wound cult- neg, no staph etc;  XRay right foot showed soft tissue swelling, no bone abn seen...  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     Labs today showed:  Chems- ok w/ BUN=32, Creat=1.1, K=5.0, BNP=159;  CBC- ok w/ Hg=16.1, WBC=9.6.Marland KitchenMarland Kitchen We decided to decr the Lasix40 slightly to 2Qam & 1Qpm...  Plan ROV 6weeks.  ~  May 03, 2011:  6week ROV & he has seen Glenn Barron w/ CT Angio done 2/13 w/ signif enlargement of aneurysm in the  distal SFA & Pop art; mult other aneurysms seen as well & several are bigger as well (see report); he has also had f/u Glenn Barron 3/13 & EKG shows AFib, rate74, RBBB, septal infarct & poor R progression... 2DEcho 3/13 showed mod LVH, norm LVF w/ EF=55-60%, mild AS, mild MR, mod LAdil measuring 52mm...  OK for surgery>> LABS 3/13:  Chems- stable w/ BUN=27 Creat=1.1;  CBC- ok w/ Hg=15.7;  BNP= 171;  Protime- 32.2/ INR=2.9 & we called to CC to adjust down...  ~  Jun 30, 2011:  85mo ROV & post hosp check> Glenn Barron had Stent Graft repair of large left SFA/ popliteal art aneurysm 4/13 by Glenn Barron; he has done well post op- bruising resolved, swelling improving, etc- f/u CTA is planned soon;  He is still quite fragile & as prev noted he has other aneurysms- bilat hypogastrics, bilat common femorals, bilat popliteals...  We reviewed his prob list, meds, xrays & labs> see below>> he is back on Coumadin via CC...  ~  September 27, 2011:  49mo ROV & Glenn Barron has been stable, notes that he had skin cancer removed from his left arm recently; his CC today are his "allergies" & he has OTC Zyrtek for prn use, plus Flonase & his Pred;  His leg swelling is decreased/ improved w/ 2+lft & 1+rt;  Other medical problems as noted below...    We reviewed prob list, meds, xrays and labs> see below for updates >>  ~  January 24, 2012:  54mo ROV & Glenn Barron is overall stable- he had ROV w/ Glenn Barron 12/13 w/ dopplers showing patent stent graft to left leg;  His breathing is stable on Advair250 & Pred- 5mg  Qod;  BP looks good & measures 114/70 on Lasix80+K20Bid;  FLP is well controlled on his diet & Lip20;  Finally GI is managed well w/ Pepcid, Miralax, Probiotics...    reviewed prob list, meds, xrays and labs> see below for updates >>  LABS 12/13:  FLP- at goals on Lip20;  Chems- ok w/ Creat=1.2;  CBC- wnl;  TSH=3.56;  PSA=0.10  05/04/2012 Acute OV  Complains of mild sob,wheezing. productive cough,clear/yellowish. states hear some rattling  when hes sleeping. denies any fever.  Congestion is mostly clear. No fever . No hemoptysis Mostly post nasal drainage and drippy nose.  No chest pain or edema.          Problem List:  OPHTHALMOLOGY - followed by [facility]... ~  7/12:  He reports that recent check up was OK...  ALLERGIC RHINITIS (ICD-477.9) - on OTC Antihist Prn and FLONASE Qhs...  BRONCHITIS, RECURRENT (ICD-491.9) - on ADVAIR 250Bid, PROVENTIL HFA Prn, & PREDNISONE 5mg  Qod... ~  CXR 7/11 showed some hyperinflation, no change scarring & vol loss RLL, NAD.Marland Kitchen. ~  CXR 11/11 in hosp showed scarring right base w/ incr opac r/o superimposed pneum > he was placed on Pred during this adm & weaned slowly as outpt... ~  CXR 12/11 here showed baseline film w/ basilar scarring & NAD... on Pred 10mg /d & asked to wean to 5mg /d... ~  Continued wean of Pred to 5mg  Qod & pt remains stable w/o change in dyspnea, & w/o resp exac... ~  CXR 4/13 showed thoracic Ao ectasia, COPD w/ fibrotic changes & atx right base, osteopenia/ DDD/ scoliosis, filter in IVC...  Hx of PULMONARY EMBOLISM (ICD-415.19) - he had a DVT w/ PTE in 2001 w/ IVC filter placed... he remains on COUMADIN w/ regular checks in the [facility]madin Clinic- doing well.  HYPERTENSION (ICD-401.9) - on LASIX monotherapy (currently 80mg /d) w/ K20/d... ~  labs 10/10 showed BUN= 17, Creat= 1.0, K= 4.7 ~  11/10: c/o incr dizzy & weak- rec to decr the Lasix to 1/2 tab daily... ~  2/11:  hosp records show norm CBC, BMet, TSH, Urine. ~  7/11: c/o incr edema & wants to incr Lasix to 1-2 Qam; BUN=28, Creat=1.2, K=4.2, BNP=97 ~  Labs 11/11 in hosp on Lasix 40mg /d showed BUN/ Creat= 29/ 1.5 ==> 18/ 0.9 & BNP= 113. ~  4/12:  On Lasix40mg - 1.5 tabsQam & BP= 118/72; notes some fatigue, intermittent dyspnea, dizziness, & edema; but denies HA, visual changes, CP, palipit, syncope, etc; labs show K=4.6, BUN=22, Creat=1.1, rec incr Lasix to 80mg  Qam... ~  7/12:  BP= 124/82 on Lasix 80mg /d... ~  11/12:   BP= 122/76 and he denies CP, palpit, dizzy, ch in SOB or edema... ~  2/13:  BP= 110/62 on Lasix 80Bid; denies CP, palpit, dizzy, ch in SOB/DOE, note decr in edema & wt. ~  5/13:  BP= 128/76 & he is improving slowly  PAROXYSMAL ATRIAL FIBRILLATION (ICD-427.31) - prev on Sotalol80 but stopped 12/10 by Glenn Barron...  remains on COUMADIN via clinic... he was prev maintaining NSR but has slipped back into AFib w/ controlled VR... ~  Last 2DEcho 2004  showed EF 50-55% w/ infer HK, incr AoV thickness, mild AI, mild LAdil, no thrombus seen... ~  EKG 12/10 showed SBrady w/ RBBB & 1st degree AVB... ~  6/11:  Last seen by Glenn Barron> he was still regular w/ pulse= 62... ~  2/12: he is in AFib clinically w/ controlled VR & HR=90, continues on Coumadin via CC  CEREBROVASCULAR DISEASE (ICD-437.9) - on ASA 81mg /d in addition to the Coumadin...  ~  he is s/p right carotid endarterectomy 7/01 by Windell Moulding...  ~  MRI 8/04 showed intracranial atherosclerotic changes w/ marked ectasia of V-B sys, sm right vertebral w/ prox stenosis, & >50% left ICA stenosis...  ~  CT Brain 1/09 showed atrophy and chr microvacs ischemic changes & remote IC lacune... ~  CDoppler 5/09 showed patent right CAE w/ DPA, mild plaque in bulb, 0-39% bilat ICA stenoses- no change... ~  CDoppler 5/10 showed patent right CAE w/ DPA, stable mild left carotid dis, 0-39% bilat- f/u 52yr.  PERIPHERAL VASCULAR DISEASE (ICD-443.9) - s/p AAA repair 8/00 & bilat Ao to iliac bypasses in 2001 by DrLawson> he had mult post-op complications and a long hosp course... ~  2011:  f/u vasc eval by Walker Kehr & referral to Glenn Barron for review> diffuse aneurysmal changes w/ 5cm left Pop art aneurysm- he was felt to be too high risk for surg & they rec BP control & observation only...  ~  2/13:  With loss of edema & decr 10# on Lasix80Bid> there is an indurated swollen area in the post aspect of left lower thigh above the pop fossa> pt referred to Glenn Barron to recheck pop art  aneurysm... ~  4/13:  S/P stent graft repair of large 9cm left SFA/ Popliteal art aneurysm by Glenn Barron... 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 ~  12/13:  F/u by [facility]- Art Dopplers revealed patent left stent graft w/ normal ABI; calcif vessels noted on right...  VENOUS INSUFFICIENCY, CHRONIC (ICD-459.81) - chr ven insuffic due to his mult DVT episodes & prev IVC filter ?2001... he's had incr trouble w/ LE edema recently & requires incr Lasix dosing... ~  Venous Dopplers right leg 8/09 showed no evid for DVT, no superfic clots, etc... ~  8/09 developed cellulitis right leg which resolved to Keflex & local care... ~  [facility] 2/11 by [facility] for left hand cellulitis... ~  [facility] 11/11 by [facility] for right arm cellulitis... ~  2/12:  exam w/ persist incr LE edema w/ pitting> rec to incr LASIX 40==>80mg /d. ~  12/12: episode of LLE cellulitis w/ incr edema, therapeutic INR, treated w/ Doxy & incr Lasix... ~  1-2/13: cellulitis resolved, incr edema rx w/ incr Lasix & he lost 10# on Lasix80Bid...  HYPERLIPIDEMIA (ICD-272.4) - on LIPITOR 20mg /d & prev FLP at goal on this dose... ~  FLP 5/08 showed TChol 124, Tg 99, HDL 40, LDL 65 ~  FLP 5/09 showed TChol 120, TG 93, HDL 36, LDL 66 ~  FLP 10/10 showed TChol 123, TG 96, HDL 47, LDL 57 ~  FLP 7/12 on Lip20 showed TChol 105, TG 69, HDL 55, LDL 36 ~  FLP 12/13 on Lip20 showed TChol 116, TG 85, HDL 53, LDL 46  GERD (  ICD-530.81) - on PEPCID 20mg /d...  DIVERTICULOSIS OF COLON (ICD-562.10) IRRITABLE BOWEL SYNDROME (ICD-564.1) - he has diarrhea (uses Immodium) & constipation (uses Miralax/ Senakot-S) w/ a tough job regulating (uses ALIGN)... COLONIC POLYPS (ICD-211.3) - last  colonoscopy 8/99 showed divertics, no recurrent polyps... last polyps removed 1996= adenomatous... also had incidental cecal lipoma seen.  Hx of PROSTATE CANCER (ICD-185) - s/p radical prostatectomy in 1994... he is followed by DrWrenn & has urinary incontinence for which he uses pads etc... ~  labs 10/10 showed PSA= 0.03 ~  labs 7/11 showed PSA= 0.04 ~  Labs 7/12 showed PSA= 0.08  DEGENERATIVE JOINT DISEASE (ICD-715.90) - he has a known lumbar disc disease and cervical spondylosis... he also takes Vit D 1000 u daily...  ~  labs 7/11 showed Vit D level = 30... continue OTC supplement.  Hx of STROKE (ICD-434.91) - he was hospitalized 8/04 w/ stroke and MRI showed acute left pontine infarct + diffuse intracranial atherosclerotic changes- see above- on ASA & Coumadin. ~  He had right Bell's Palsey 3/12 & went to ER then called [place] & we Rx w/ Valtrex & Pred Dosepak ==> resolved & back to normal...  PERIPHERAL NEUROPATHY (ICD-356.9) - he has LBP, neuropathy and a gait abnormality...  ANXIETY DEPRESSION (ICD-300.4) - wife, Kathie Rhodes, died 14-Jul-2022 on hospice (severe pulm fibrosis from scleroderma/ CREST/ etc)...  HEALTH MAINTENANCE: he had SHINGLES vaccine 6/12 & we gave him the TDAP 7/12...   Past Surgical History  Procedure Laterality Date  . Prostateectomy    . Cataract extraction    . Carotid endarterectomy      right  . Abdominal aortic aneurysm repair    . Skin cancer excision    . Inguinal hernia repair  1991    bilateral    Outpatient Encounter Prescriptions as of 05/01/2012  Medication Sig Dispense Refill  . acetaminophen (TYLENOL) 650 MG CR tablet Take 650 mg by mouth 2 (two) times daily.      Marland Kitchen ADVAIR DISKUS 250-50 MCG/DOSE AEPB inhale 1 puff INTO THE LUNGS twice a day RINSE MOUTH WELL  60 each  11  . aspirin 81 MG tablet Take 81 mg by mouth daily.        Marland Kitchen atorvastatin (LIPITOR) 20 MG tablet take 1 tablet by mouth once daily  30 tablet  11  . calcium carbonate (TUMS - DOSED IN MG  ELEMENTAL CALCIUM) 500 MG chewable tablet As needed       . cholecalciferol (VITAMIN D) 1000 UNITS tablet Take 1,000 Units by mouth daily.        . famotidine (PEPCID) 10 MG tablet Take 10 mg by mouth at bedtime.        . fluticasone (FLONASE) 50 MCG/ACT nasal spray instill 2 sprays into each nostril once daily  16 g  11  . furosemide (LASIX) 40 MG tablet Take 2 tablets by mouth in the morning .      Marland Kitchen HYDROcodone-homatropine (HYCODAN) 5-1.5 MG/5ML syrup Take 5 mLs by mouth every 4 (four) hours as needed for cough.  100 mL  0  . KLOR-CON M20 20 MEQ tablet take 1 tablet by mouth twice a day  60 each  5  . levofloxacin (LEVAQUIN) 500 MG tablet Take 1 tablet (500 mg total) by mouth daily.  7 tablet  0  . polyethylene glycol (MIRALAX / GLYCOLAX) packet Take 17 g by mouth as needed.      . predniSONE (DELTASONE) 10 MG tablet Take 5 mg by mouth  every other day.      Marland Kitchen PROAIR HFA 108 (90 BASE) MCG/ACT inhaler INHALE 1 TO 2 PUFFS INTO THE LUNGS EVERY 6 HOURS AS NEEDED.  8.5 g  11  . PROBIOTIC CAPS Take 1 capsule by mouth daily.       Marland Kitchen warfarin (COUMADIN) 4 MG tablet TAKE AS DIRECTED BY COUMADIN CLINIC  100 tablet  2   No facility-administered encounter medications on file as of 05/01/2012.    Allergies  Allergen Reactions  . Amoxicillin     REACTION: rash  . Doxycycline     Rash    Current Medications, Allergies, Past Medical History, Past Surgical History, Family History, and Social History were reviewed in [employer] record.   Review of Systems         Constitutional:   No  weight loss, night sweats,  Fevers, chills, fatigue, or  lassitude.  HEENT:   No headaches,  Difficulty swallowing,  Tooth/dental problems, or  Sore throat,                No sneezing, itching, ear ache,  +nasal congestion, post nasal drip,   CV:  No chest pain,  Orthopnea, PND,   anasarca, dizziness, palpitations, syncope.   GI  No heartburn, indigestion, abdominal pain, nausea, vomiting,  diarrhea, change in bowel habits, loss of appetite, bloody stools.   Resp:   No chest wall deformity  Skin: no rash or lesions.  GU: no dysuria, change in color of urine, no urgency or frequency.  No flank pain, no hematuria   MS:  No joint pain or swelling.  No decreased range of motion.  No back pain.  Psych:  No change in mood or affect. No depression or anxiety.  No memory loss.        Objective:   Physical Exam     WD, sl thin, 79 y/o WM in NAD... GENERAL:  Alert & oriented; pleasant & cooperative... HEENT:  [place]/AT,EACs-clear, TMs-wnl, NOSE-clear drainage  THROAT-clear & wnl. NECK:  Supple w/ fairROM; no JVD; s/p right CAE, no bruits; no thyromegaly or nodules palpated; no lymphadenopathy. CHEST: decr BS bilat-,  no consolidation, no wheezing... HEART:  sl irregular rhythm; w/o murmur, rubs, or gallop heard... ABDOMEN:  Soft & nontender; normal bowel sounds; no organomegaly or masses detected. EXT: without deformities, mod arthritic changes; ambulates w/ walker; +venous stasis, 1+edema noted.L>R  NEURO:   gait abn; no focal neuro deficits,  +generalized weakness... DERM:  No lesions noted; no rash etc...  Assessment & Plan:

## 2012-05-01 NOTE — Patient Instructions (Addendum)
Claritin 10mg  daily As needed  Drainage  Saline nasal spray As needed   Mucinex DM Twice daily  As needed  Cough/congestion /thick mucus.  I will call with xray results .  Please contact office for sooner follow up if symptoms do not improve or worsen or seek emergency care   follow up Dr. Kriste Basque  As planned and As needed

## 2012-05-03 ENCOUNTER — Telehealth: Payer: Self-pay | Admitting: Pulmonary Disease

## 2012-05-03 NOTE — Telephone Encounter (Signed)
Notes Recorded by Julio Sicks, NP on 05/02/2012 at 10:19 AM No sign of PNA  Cont w/ ov recs  Please contact office for sooner follow up if symptoms do not improve or worsen or seek emergency care   I spoke with patient about results and he verbalized understanding and had no questions

## 2012-05-04 NOTE — Assessment & Plan Note (Signed)
Flare  Plan  Claritin 10mg  daily As needed  Drainage  Saline nasal spray As needed   Mucinex DM Twice daily  As needed  Cough/congestion /thick mucus.  I will call with xray results .  Please contact office for sooner follow up if symptoms do not improve or worsen or seek emergency care   follow up Dr. Kriste Basque  As planned and As needed

## 2012-05-04 NOTE — Assessment & Plan Note (Signed)
Check xray  Hold on abx   Plan  Claritin 10mg  daily As needed  Drainage  Saline nasal spray As needed   Mucinex DM Twice daily  As needed  Cough/congestion /thick mucus.  I will call with xray results .  Please contact office for sooner follow up if symptoms do not improve or worsen or seek emergency care   follow up Dr. Kriste Basque  As planned and As needed

## 2012-05-16 ENCOUNTER — Ambulatory Visit (INDEPENDENT_AMBULATORY_CARE_PROVIDER_SITE_OTHER): Payer: Medicare Other | Admitting: *Deleted

## 2012-05-16 DIAGNOSIS — I4891 Unspecified atrial fibrillation: Secondary | ICD-10-CM

## 2012-05-16 DIAGNOSIS — I2699 Other pulmonary embolism without acute cor pulmonale: Secondary | ICD-10-CM

## 2012-05-16 DIAGNOSIS — I82409 Acute embolism and thrombosis of unspecified deep veins of unspecified lower extremity: Secondary | ICD-10-CM

## 2012-05-16 DIAGNOSIS — Z7901 Long term (current) use of anticoagulants: Secondary | ICD-10-CM

## 2012-05-16 LAB — POCT INR: INR: 2.2

## 2012-05-20 ENCOUNTER — Other Ambulatory Visit: Payer: Self-pay | Admitting: Pulmonary Disease

## 2012-05-23 ENCOUNTER — Telehealth: Payer: Self-pay | Admitting: Adult Health

## 2012-05-23 ENCOUNTER — Telehealth: Payer: Self-pay | Admitting: Pulmonary Disease

## 2012-05-23 ENCOUNTER — Telehealth: Payer: Self-pay | Admitting: *Deleted

## 2012-05-23 MED ORDER — METHYLPREDNISOLONE 4 MG PO KIT: PACK | ORAL | Status: DC

## 2012-05-23 MED ORDER — LEVOFLOXACIN 500 MG PO TABS: 500.0000 mg | ORAL_TABLET | Freq: Every day | ORAL | Status: DC

## 2012-05-23 NOTE — Telephone Encounter (Signed)
I spoke with pt and is aware of SN recs. He voiced his understanding and rx has been called in.

## 2012-05-23 NOTE — Telephone Encounter (Signed)
Per SN---  levaquin 500  1 daily  #7 Medrol dosepak  #1  Take as directed

## 2012-05-23 NOTE — Telephone Encounter (Signed)
Called and spoke with pt and he is aware of SN recs---  Only other option is to increase the lasix 40 mg  2 tabs in the am and 1 tab in the pm.  Pt stated that he will start on this tonight and pt is aware of appt with SN on 5/27 and we will repeat labs at this appt.  Pt voiced his understanding.

## 2012-05-23 NOTE — Telephone Encounter (Signed)
Pt called to inform us that he is starting Levaquin and Prednisone dose Pk (4MG ) today. Levaquin for 7 days and Prednisone for 6 days.

## 2012-05-23 NOTE — Telephone Encounter (Signed)
I spoke with pt. He c/o cough w/ light yellow phlem-green phlem, cough is worse kying down and at night, PND, minor nasal congestion, wheezing and chest tx int he mornings. No fever/sore throat, upset stomach. He has been using saline and flonase. Please advise SN thanks Last OV 05/01/12 Pending OV 07/24/12 Allergies  Allergen Reactions  . Amoxicillin     REACTION: rash  . Doxycycline     Rash

## 2012-05-23 NOTE — Telephone Encounter (Signed)
Called and spoke with pt and he stated that this has been going on for a long time but worse recently with the swelling of both legs but the left leg is worse.  [name] currently taking his lasix 40 mg  2 every morning and wearing the support hose during the day and keeping his feel elevated.  Pt stated that he is on cortisone now and will have to go in on Monday for recheck on coumadin.   He stated that in the morning his legs have gone down but throughout the day his legs get very swollen.   Pt denies any new pain or redness.  SN please advise thanks  Allergies  Allergen Reactions  . Amoxicillin     REACTION: rash  . Doxycycline     Rash

## 2012-05-28 ENCOUNTER — Ambulatory Visit (INDEPENDENT_AMBULATORY_CARE_PROVIDER_SITE_OTHER): Payer: Medicare Other | Admitting: Pharmacist

## 2012-05-28 ENCOUNTER — Encounter: Payer: Self-pay | Admitting: Pharmacist

## 2012-05-28 DIAGNOSIS — I82409 Acute embolism and thrombosis of unspecified deep veins of unspecified lower extremity: Secondary | ICD-10-CM

## 2012-05-28 DIAGNOSIS — Z7901 Long term (current) use of anticoagulants: Secondary | ICD-10-CM

## 2012-05-28 DIAGNOSIS — I4891 Unspecified atrial fibrillation: Secondary | ICD-10-CM

## 2012-05-28 DIAGNOSIS — I2699 Other pulmonary embolism without acute cor pulmonale: Secondary | ICD-10-CM

## 2012-05-28 LAB — POCT INR: INR: 2.5

## 2012-06-25 ENCOUNTER — Telehealth: Payer: Self-pay | Admitting: Pulmonary Disease

## 2012-06-25 DIAGNOSIS — I2699 Other pulmonary embolism without acute cor pulmonale: Secondary | ICD-10-CM

## 2012-06-25 DIAGNOSIS — J42 Unspecified chronic bronchitis: Secondary | ICD-10-CM

## 2012-06-25 NOTE — Telephone Encounter (Signed)
Spoke with pt aware of SN rec's .nothing further needed.

## 2012-06-25 NOTE — Telephone Encounter (Signed)
Per SN--   Pt does not need fasting labs at this time.  Labs have been placed that pt will need.  Pt will also need cxr and this order has been placed as well.  thanks

## 2012-06-25 NOTE — Telephone Encounter (Signed)
Last OV 05/01/12 w./ TP Please advise what labs pt will need to have done SN thanks

## 2012-06-28 ENCOUNTER — Telehealth: Payer: Self-pay | Admitting: Pulmonary Disease

## 2012-06-28 NOTE — Telephone Encounter (Signed)
I spoke with pt and he wanted to know if he eneded any papers for his labs and CXR. i advised him it was in the system and he did not need paperwork. I also made him aware we are closed on Monday. Nothing further was needed

## 2012-07-03 ENCOUNTER — Ambulatory Visit (INDEPENDENT_AMBULATORY_CARE_PROVIDER_SITE_OTHER)
Admission: RE | Admit: 2012-07-03 | Discharge: 2012-07-03 | Disposition: A | Discharge status: Home / Self Care | Payer: Medicare Other | Source: Ambulatory Visit | Attending: Pulmonary Disease | Admitting: Pulmonary Disease

## 2012-07-03 ENCOUNTER — Other Ambulatory Visit (INDEPENDENT_AMBULATORY_CARE_PROVIDER_SITE_OTHER): Payer: Medicare Other

## 2012-07-03 ENCOUNTER — Encounter: Payer: Self-pay | Admitting: Pulmonary Disease

## 2012-07-03 ENCOUNTER — Ambulatory Visit (INDEPENDENT_AMBULATORY_CARE_PROVIDER_SITE_OTHER): Payer: Medicare Other | Admitting: Pulmonary Disease

## 2012-07-03 VITALS — BP 116/82 | HR 85 | Temp 100.1°F | Ht 74.0 in | Wt 165.0 lb

## 2012-07-03 DIAGNOSIS — E785 Hyperlipidemia, unspecified: Secondary | ICD-10-CM

## 2012-07-03 DIAGNOSIS — G609 Hereditary and idiopathic neuropathy, unspecified: Secondary | ICD-10-CM

## 2012-07-03 DIAGNOSIS — J209 Acute bronchitis, unspecified: Secondary | ICD-10-CM

## 2012-07-03 DIAGNOSIS — I872 Venous insufficiency (chronic) (peripheral): Secondary | ICD-10-CM

## 2012-07-03 DIAGNOSIS — I724 Aneurysm of artery of lower extremity: Secondary | ICD-10-CM

## 2012-07-03 DIAGNOSIS — I10 Essential (primary) hypertension: Secondary | ICD-10-CM

## 2012-07-03 DIAGNOSIS — I2699 Other pulmonary embolism without acute cor pulmonale: Secondary | ICD-10-CM

## 2012-07-03 DIAGNOSIS — I679 Cerebrovascular disease, unspecified: Secondary | ICD-10-CM

## 2012-07-03 DIAGNOSIS — I6350 Cerebral infarction due to unspecified occlusion or stenosis of unspecified cerebral artery: Secondary | ICD-10-CM

## 2012-07-03 DIAGNOSIS — R609 Edema, unspecified: Secondary | ICD-10-CM

## 2012-07-03 DIAGNOSIS — J42 Unspecified chronic bronchitis: Secondary | ICD-10-CM

## 2012-07-03 DIAGNOSIS — F341 Dysthymic disorder: Secondary | ICD-10-CM

## 2012-07-03 DIAGNOSIS — I739 Peripheral vascular disease, unspecified: Secondary | ICD-10-CM

## 2012-07-03 DIAGNOSIS — I4891 Unspecified atrial fibrillation: Secondary | ICD-10-CM

## 2012-07-03 DIAGNOSIS — M1990 Unspecified osteoarthritis, unspecified site: Secondary | ICD-10-CM

## 2012-07-03 LAB — BASIC METABOLIC PANEL
CO2: 32 mEq/L (ref 19–32)
Chloride: 99 mEq/L (ref 96–112)
Sodium: 138 mEq/L (ref 135–145)

## 2012-07-03 MED ORDER — AMOXICILLIN-POT CLAVULANATE 875-125 MG PO TABS: 1.0000 | ORAL_TABLET | Freq: Two times a day (BID) | ORAL | Status: DC

## 2012-07-03 NOTE — Progress Notes (Signed)
Subjective:    Patient ID: Glenn Barron, male    DOB: Aug 23, 1922, 79 y.o.   MRN: [id number]  HPI 79 y/o WM here for a follow up visit... he has mult med problems as noted below... Followed for general medical purposes w/ hx of recurrent bronchitis & allergies; hx PTE/ DVT/ & IVC filter on Coumadin; HBP; AFib; cerebrovasc dis w/ hx stroke & prev right CAE; ASPVD w/ prev AAA repair & subseq bilat Ao-iliac bypasses; Hyperchol; Prostate cancer; neuropathy; and anxiety/ depression...  ~  February 22, 2011:  6wk ROV & add-on for f/u cellulitis> he saw TP 12/12 w/ acute swelling & redness right foot/ ankle/ leg, area is tender, no broken skin or trauma reported, no drainage, denies fever, no CP/ SOB, etc;  [name] already on Coumadin, followed in the CC & therapeutic, he had IVC filter placed yrs ago; Treated w/ Doxy po & infective symptoms improved> less red/ inflammed etc but still swollen- VI w/ edema up to his thigh on Lasix 40mg - 2Qam...  We decided to incr lasix to 80mg  Bid, 2gm Na+ diet (he's been eating TV dinners, soups, etc), elevation, etc... Plan f/u w/ labs in 132month.    Labs 12/12 showed Chems- ok w/ BUN=19, Creat=1.9, BNP=237;  CBC w/ Hg=15.5, WBC=10.9;  Wound cult- neg, no staph etc;  XRay right foot showed soft tissue swelling, no bone abn seen...  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     Labs today showed:  Chems- ok w/ BUN=32, Creat=1.1, K=5.0, BNP=159;  CBC- ok w/ Hg=16.1, WBC=9.6.Marland KitchenMarland Kitchen We decided to decr the Lasix40 slightly to 2Qam & 1Qpm...  Plan ROV 6weeks.  ~  May 03, 2011:  6week ROV & he has seen  DrEarly w/ CT Angio done 2/13 w/ signif enlargement of aneurysm in the distal SFA & Pop art; mult other aneurysms seen as well & several are bigger as well (see report); he has also had f/u DrNishan 3/13 & EKG shows AFib, rate74, RBBB, septal infarct & poor R progression... 2DEcho 3/13 showed mod LVH, norm LVF w/ EF=55-60%, mild AS, mild MR, mod LAdil measuring 52mm...  OK for surgery>> LABS 3/13:  Chems- stable w/ BUN=27 Creat=1.1;  CBC- ok w/ Hg=15.7;  BNP= 171;  Protime- 32.2/ INR=2.9 & we called to CC to adjust down...  ~  Jun 30, 2011:  32mo ROV & post hosp check> Cadin had Stent Graft repair of large left SFA/ popliteal art aneurysm 4/13 by DrEarly; he has done well post op- bruising resolved, swelling improving, etc- f/u CTA is planned soon;  He is still quite fragile & as prev noted he has other aneurysms- bilat hypogastrics, bilat common femorals, bilat popliteals...  We reviewed his prob list, meds, xrays & labs> see below>> he is back on Coumadin via CC...  ~  September 27, 2011:  14mo ROV & Hassan has been stable, notes that he had skin cancer removed from his left arm recently; his CC today are his "allergies" & he has OTC Zyrtek for prn use, plus Flonase &  his Pred;  His leg swelling is decreased/ improved w/ 2+lft & 1+rt;  Other medical problems as noted below...    We reviewed prob list, meds, xrays and labs> see below for updates >>  ~  January 24, 2012:  64mo ROV & Kaige is overall stable- he had ROV w/ Belva Crome 12/13 w/ dopplers showing patent stent graft to left leg;  His breathing is stable on Advair250 & Pred- 5mg  Qod;  BP looks good & measures 114/70 on Lasix80+K20Bid;  FLP is well controlled on his diet & Lip20;  Finally GI is managed well w/ Pepcid, Miralax, Probiotics...    reviewed prob list, meds, xrays and labs> see below for updates >>  LABS 12/13:  FLP- at goals on Lip20;  Chems- ok w/ Creat=1.2;  CBC- wnl;  TSH=3.56;  PSA=0.10   ~  Jul 03, 2012:  16mo ROV & Bonnie  presents w/ cough, yellow/brown sput, low grade temp (100.1 today), & some SOB/wheezing; he noted that his legs were swelling so he increased his Lasix40 from 2tabs to 3 on his own; weight is actually down 10# & BP 116/82, O2sat = 95% on RA;  CXR shows mild cardiomeg, bilat scarring but no acute infiltrate, there is a bulge in the prox desc thoracic aorta (more prom than 2010 films) and radiology rec CTAngio but pt wants to wait for his f/u appt w/ DrEarly in June to discuss this;  We decided to treat for Bronchitis w/ Augmentin, Mucinex, Fluids- he declines Levaquin Rx ...     He has hx severe periph vasc dis followed by DrEarly> s/p AAA in 2000 & Ao to bilat iliac bypass grafts in 2001; then had stent graft repair of large left SFA/ Popliteal art aneurysm 4/13; due for f/u studies by DrEarly soon.,..    We reviewed prob list, meds, xrays and labs> see below for updates >>  LABS 5/14:  Chems- wnl w/ Cr=1.1 CXR 5/14 shows mild cardiomeg, bilat scarring but no acute infiltrate, there is a bulge in the prox desc thoracic aorta (more prom than 2010 films)...           Problem List:  OPHTHALMOLOGY - followed by [facility]... ~  7/12:  He reports that recent check up was OK...  ALLERGIC RHINITIS (ICD-477.9) - on OTC Antihist Prn and FLONASE Qhs...  BRONCHITIS, RECURRENT (ICD-491.9) - on ADVAIR 250Bid, PROVENTIL HFA Prn, & PREDNISONE 5mg  Qod... ~  CXR 7/11 showed some hyperinflation, no change scarring & vol loss RLL, NAD.Marland Kitchen. ~  CXR 11/11 in hosp showed scarring right base w/ incr opac r/o superimposed pneum > he was placed on Pred during this adm & weaned slowly as outpt... ~  CXR 12/11 here showed baseline film w/ basilar scarring & NAD... on Pred 10mg /d & asked to wean to 5mg /d... ~  Continued wean of Pred to 5mg  Qod & pt remains stable w/o change in dyspnea, & w/o resp exac... ~  CXR 4/13 showed thoracic Ao ectasia, COPD w/ fibrotic changes & atx right base, osteopenia/ DDD/ scoliosis, filter  in IVC... ~  CXR 5/14 mild cardiomeg, bilat scarring but no acute infiltrate, there is a bulge in the prox desc thoracic aorta (more prom than 2010 films) and radiology rec CTAngio but pt wants to wait for his f/u appt w/ DrEarly in June to discuss this.  Hx of PULMONARY EMBOLISM (ICD-415.19) - he had a DVT w/ PTE in 2001 w/ IVC filter placed... he remains on COUMADIN w/  regular checks in the Coumadin Clinic- doing well.  HYPERTENSION (ICD-401.9) - on LASIX monotherapy (currently 80mg /d) w/ K20/d... ~  labs 10/10 showed BUN= 17, Creat= 1.0, K= 4.7 ~  11/10: c/o incr dizzy & weak- rec to decr the Lasix to 1/2 tab daily... ~  2/11:  hosp records show norm CBC, BMet, TSH, Urine. ~  7/11: c/o incr edema & wants to incr Lasix to 1-2 Qam; BUN=28, Creat=1.2, K=4.2, BNP=97 ~  Labs 11/11 in hosp on Lasix 40mg /d showed BUN/ Creat= 29/ 1.5 ==> 18/ 0.9 & BNP= 113. ~  4/12:  On Lasix40mg - 1.5 tabsQam & BP= 118/72; notes some fatigue, intermittent dyspnea, dizziness, & edema; but denies HA, visual changes, CP, palipit, syncope, etc; labs show K=4.6, BUN=22, Creat=1.1, rec incr Lasix to 80mg  Qam... ~  7/12:  BP= 124/82 on Lasix 80mg /d... ~  11/12:  BP= 122/76 and he denies CP, palpit, dizzy, ch in SOB or edema... ~  2/13:  BP= 110/62 on Lasix 80Bid; denies CP, palpit, dizzy, ch in SOB/DOE, note decr in edema & wt. ~  5/13:  BP= 128/76 & he is improving slowly ~  5/14: on Lasix40- 2AM & 1PM + K20; BP116/82, assoc w/ 10 lb wt loss...  PAROXYSMAL ATRIAL FIBRILLATION (ICD-427.31) - prev on Sotalol80 but stopped 12/10 by DrNishan...  remains on COUMADIN via clinic... he was prev maintaining NSR but has slipped back into AFib w/ controlled VR... ~  Last 2DEcho 2004 showed EF 50-55% w/ infer HK, incr AoV thickness, mild AI, mild LAdil, no thrombus seen... ~  EKG 12/10 showed SBrady w/ RBBB & 1st degree AVB... ~  6/11:  Last seen by DrNishan> he was still regular w/ pulse= 62... ~  2/12: he is in AFib clinically w/  controlled VR & HR=90, continues on Coumadin via CC ~  1/14:  He saw DrNishan> HxPAF/SSS on Coumadin (he has IVC filter in place >70yrs), RBBB, TIA, AAA w/ AoBifem, left popliteal aneurysm repaired by stent graft 4/13; no med changes- felt to be stable...  CEREBROVASCULAR DISEASE (ICD-437.9) - on ASA 81mg /d in addition to the Coumadin...  ~  he is s/p right carotid endarterectomy 7/01 by Windell Moulding...  ~  MRI 8/04 showed intracranial atherosclerotic changes w/ marked ectasia of V-B sys, sm right vertebral w/ prox stenosis, & >50% left ICA stenosis...  ~  CT Brain 1/09 showed atrophy and chr microvacs ischemic changes & remote IC lacune... ~  CDoppler 5/09 showed patent right CAE w/ DPA, mild plaque in bulb, 0-39% bilat ICA stenoses- no change... ~  CDoppler 5/10 showed patent right CAE w/ DPA, stable mild left carotid dis, 0-39% bilat- f/u 65yr.  PERIPHERAL VASCULAR DISEASE (ICD-443.9) - s/p AAA repair 8/00 & bilat Ao to iliac bypasses in 2001 by DrLawson> he had mult post-op complications and a long hosp course... 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  ~  2/13:  With loss of edema & decr 10# on Lasix80Bid> there is an indurated swollen area in the post aspect of left lower thigh above the pop fossa> pt referred to DrEarly to recheck pop art aneurysm... ~  4/13:  S/P stent graft repair of large 9cm left SFA/ Popliteal art aneurysm by DrEarly... 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 ~  12/13:  F/u by [facility]- Art Dopplers revealed patent left  stent graft w/ normal ABI; calcif vessels noted on right...  VENOUS INSUFFICIENCY, CHRONIC (ICD-459.81) - chr ven insuffic due to his mult DVT episodes & prev IVC filter ?2001... he's had incr trouble w/ LE edema recently & requires incr Lasix dosing... ~  Venous Dopplers right leg 8/09 showed no evid for DVT, no superfic clots, etc... ~  8/09 developed cellulitis right leg which resolved to Keflex & local care... ~  [facility] 2/11 by [facility] for left hand cellulitis... ~  [facility] 11/11 by [facility] for right arm cellulitis... ~  2/12:  exam w/ persist incr LE edema w/ pitting> rec to incr LASIX 40==>80mg /d. ~  12/12: episode of LLE cellulitis w/ incr edema, therapeutic INR, treated w/ Doxy & incr Lasix... ~  1-2/13: cellulitis resolved, incr edema rx w/ incr Lasix & he lost 10# on Lasix80Bid...  HYPERLIPIDEMIA (ICD-272.4) - on LIPITOR 20mg /d & prev FLP at goal on this dose... ~  FLP 5/08 showed TChol 124, Tg 99, HDL 40, LDL 65 ~  FLP 5/09 showed TChol 120, TG 93, HDL 36, LDL 66 ~  FLP 10/10 showed TChol 123, TG 96, HDL 47, LDL 57 ~  FLP 7/12 on Lip20 showed TChol 105, TG 69, HDL 55, LDL 36 ~  FLP 12/13 on Lip20 showed TChol 116, TG 85, HDL 53, LDL 46  GERD (ICD-530.81) - on PEPCID 20mg /d...  DIVERTICULOSIS OF COLON (ICD-562.10) IRRITABLE BOWEL SYNDROME (ICD-564.1) - he has diarrhea (uses Immodium) & constipation (uses Miralax/ Senakot-S) w/ a tough job regulating (uses ALIGN)... COLONIC POLYPS (ICD-211.3) - last colonoscopy 8/99 showed divertics, no recurrent polyps... last polyps removed 1996= adenomatous... also had incidental cecal lipoma seen.  Hx of PROSTATE CANCER (ICD-185) - s/p radical prostatectomy in 1994... he is followed by DrWrenn & has urinary incontinence for which he uses pads etc... ~  labs 10/10 showed PSA= 0.03 ~  labs 7/11 showed PSA= 0.04 ~  Labs 7/12 showed PSA= 0.08  DEGENERATIVE JOINT DISEASE (ICD-715.90) - he has a known lumbar disc disease and cervical spondylosis... he also takes  Vit D 1000 u daily...  ~  labs 7/11 showed Vit D level = 30... continue OTC supplement.  Hx of STROKE (ICD-434.91) - he was hospitalized 8/04 w/ stroke and MRI showed acute left pontine infarct + diffuse intracranial atherosclerotic changes- see above- on ASA & Coumadin. ~  He had right Bell's Palsey 3/12 & went to ER then called [place] & we Rx w/ Valtrex & Pred Dosepak ==> resolved & back to normal...  PERIPHERAL NEUROPATHY (ICD-356.9) - he has LBP, neuropathy and a gait abnormality...  ANXIETY DEPRESSION (ICD-300.4) - wife, Kathie Rhodes, died 07-04-22 on hospice (severe pulm fibrosis from scleroderma/ CREST/ etc)...  HEALTH MAINTENANCE: he had SHINGLES vaccine 6/12 & we gave him the TDAP 7/12...   Past Surgical History  Procedure Laterality Date  . Prostateectomy    . Cataract extraction    . Carotid endarterectomy      right  . Abdominal aortic aneurysm repair    . Skin cancer excision    . Inguinal hernia repair  1991    bilateral    Outpatient Encounter Prescriptions as of  07/03/2012  Medication Sig Dispense Refill  . acetaminophen (TYLENOL) 650 MG CR tablet Take 650 mg by mouth 2 (two) times daily.      Marland Kitchen ADVAIR DISKUS 250-50 MCG/DOSE AEPB inhale 1 puff INTO THE LUNGS twice a day RINSE MOUTH WELL  60 each  11  . aspirin 81 MG tablet Take 81 mg by mouth daily.        Marland Kitchen atorvastatin (LIPITOR) 20 MG tablet take 1 tablet by mouth once daily  30 tablet  11  . calcium carbonate (TUMS - DOSED IN MG ELEMENTAL CALCIUM) 500 MG chewable tablet As needed       . cholecalciferol (VITAMIN D) 1000 UNITS tablet Take 1,000 Units by mouth daily.        . famotidine (PEPCID) 10 MG tablet Take 10 mg by mouth at bedtime.        . fluticasone (FLONASE) 50 MCG/ACT nasal spray instill 2 sprays into each nostril once daily  16 g  11  . furosemide (LASIX) 40 MG tablet Take 2 tablets by mouth in the morning .      Marland Kitchen HYDROcodone-homatropine (HYCODAN) 5-1.5 MG/5ML syrup Take 5 mLs by mouth every 4 (four) hours as  needed for cough.  100 mL  0  . KLOR-CON M20 20 MEQ tablet take 1 tablet by mouth twice a day  60 each  5  . levofloxacin (LEVAQUIN) 500 MG tablet Take 1 tablet (500 mg total) by mouth daily.  7 tablet  0  . methylPREDNISolone (MEDROL DOSEPAK) 4 MG tablet Take as directed  21 tablet  0  . polyethylene glycol (MIRALAX / GLYCOLAX) packet Take 17 g by mouth as needed.      . predniSONE (DELTASONE) 10 MG tablet Take 5 mg by mouth every other day.      Marland Kitchen PROAIR HFA 108 (90 BASE) MCG/ACT inhaler INHALE 1 TO 2 PUFFS INTO THE LUNGS EVERY 6 HOURS AS NEEDED.  8.5 g  11  . PROBIOTIC CAPS Take 1 capsule by mouth daily.       Marland Kitchen warfarin (COUMADIN) 4 MG tablet TAKE AS DIRECTED BY COUMADIN CLINIC  100 tablet  2   No facility-administered encounter medications on file as of 07/03/2012.    Allergies  Allergen Reactions  . Amoxicillin     REACTION: rash  . Doxycycline     Rash    Current Medications, Allergies, Past Medical History, Past Surgical History, Family History, and Social History were reviewed in [employer] record.   Review of Systems         See HPI - all other systems neg except as noted... The patient complains of decreased hearing, dyspnea on exertion, peripheral edema, muscle weakness, difficulty walking, and depression.  The patient denies anorexia, fever, weight loss, weight gain, vision loss, hoarseness, chest pain, syncope, prolonged cough, headaches, hemoptysis, abdominal pain, melena, hematochezia, severe indigestion/heartburn, hematuria, incontinence, suspicious skin lesions, transient blindness, unusual weight change, abnormal bleeding, enlarged lymph nodes, and angioedema.     Objective:   Physical Exam     WD, sl thin, 79 y/o WM in NAD... GENERAL:  Alert & oriented; pleasant & cooperative... HEENT:  [place]/AT, EOM-wnl, PERRLA, EACs-clear, TMs-wnl, NOSE-clear, THROAT-clear & wnl. NECK:  Supple w/ fairROM; no JVD; s/p right CAE, no bruits; no thyromegaly or  nodules palpated; no lymphadenopathy. CHEST: decr BS bilat- few rhonchi in RLL area, no consolidation, no wheezing... HEART:  sl irregular rhythm; w/o murmur, rubs, or gallop heard... ABDOMEN:  Soft & nontender; normal bowel sounds; no organomegaly or masses detected. EXT: without deformities, mod arthritic changes; ambulates w/ walker; +venous stasis, 2+edema noted. NEURO:  CN's intact;  gait abn; no focal neuro deficits,  +generalized weakness... DERM:  No lesions noted; no rash etc...  RADIOLOGY DATA:  Reviewed in the EPIC EMR & discussed w/ the patient...  LABORATORY DATA:  Reviewed in the EPIC EMR & discussed w/ the patient...     Assessment & Plan:    Acute Bronchitis>  Treat w/ augmentin, Mucinex, Fluids, etc...  HBP>  Remains under good control w/ his diet + Lasix monotherapy;  Now on 80mg AM & 40mg PM & his Cr=1.1, continue same...  AFib>  Followed by Walker Kehr for cards, on Coumadin via clinic w/ therapeutic INR, controlled VR...  VASC DISEASE>  He has Cerebrovasc dis & Periph vasc dis followed by Walker Kehr & [facility]- DrEarly, DrLawson;  Due for f/u visit soon... CXR shows bulge in the prox decs Ao & radiology suggests CTA- pt want to discuss w/ DrEarly, perhaps check this when he does CTA of stent graft...  Venous Insuffic w/ Edema> Labs reviewed & satable on 80mg Qam & 40mg Qpm...  Hx DVT w/ PTE in 2001 w/ IVC filter placed & on Coumadin followed in the CC ever since...  Hyperlipidemia>  Controlled on Lip20 + diet efforts...  GI> GERD, Divertics, IBS, Polyps>  Stable on Pepcid, Miralax, Align...  GU> Hx prostate cancer, urinary incont>  Followed by DrWrenn...  Hx Stroke>  This occurred in 2004, on ASA & Coumadin...  Hx of Bell's Palsey>  S/p Right Bell's Palsey 3/12, treated w/ Valtrex & Dosepak & resolved back to baseline...  Other medical problems as noted...     Patient's Medications  New Prescriptions   AMOXICILLIN-CLAVULANATE (AUGMENTIN) 875-125 MG PER TABLET    Take  1 tablet by mouth 2 (two) times daily.  Previous Medications   ACETAMINOPHEN (TYLENOL) 650 MG CR TABLET    Take 650 mg by mouth 2 (two) times daily.   ADVAIR DISKUS 250-50 MCG/DOSE AEPB    inhale 1 puff INTO THE LUNGS twice a day RINSE MOUTH WELL   ASPIRIN 81 MG TABLET    Take 81 mg by mouth daily.     ATORVASTATIN (LIPITOR) 20 MG TABLET    take 1 tablet by mouth once daily   CALCIUM CARBONATE (TUMS - DOSED IN MG ELEMENTAL CALCIUM) 500 MG CHEWABLE TABLET    As needed    CHOLECALCIFEROL (VITAMIN D) 1000 UNITS TABLET    Take 1,000 Units by mouth daily.     FAMOTIDINE (PEPCID) 10 MG TABLET    Take 10 mg by mouth at bedtime.     FLUTICASONE (FLONASE) 50 MCG/ACT NASAL SPRAY    instill 2 sprays into each nostril once daily   FUROSEMIDE (LASIX) 40 MG TABLET    Take 2  tablets by mouth in the morning and 1 tablet in the evening   LORATADINE (CLARITIN) 10 MG TABLET    Take 10 mg by mouth daily.   POLYETHYLENE GLYCOL (MIRALAX / GLYCOLAX) PACKET    Take 17 g by mouth as needed.   PREDNISONE (DELTASONE) 10 MG TABLET    Take 5 mg by mouth every other day.   PROAIR HFA 108 (90 BASE) MCG/ACT INHALER    INHALE 1 TO 2 PUFFS INTO THE LUNGS EVERY 6 HOURS AS NEEDED.   PROBIOTIC CAPS    Take 1 capsule by mouth daily.    WARFARIN (COUMADIN) 4 MG  TABLET    TAKE AS DIRECTED BY COUMADIN CLINIC  Modified Medications   Modified Medication Previous Medication   POTASSIUM CHLORIDE SA (KLOR-CON M20) 20 MEQ TABLET KLOR-CON M20 20 MEQ tablet          take 1 tablet by mouth twice a day  Discontinued Medications   HYDROCODONE-HOMATROPINE (HYCODAN) 5-1.5 MG/5ML SYRUP    Take 5 mLs by mouth every 4 (four) hours as needed for cough.   LEVOFLOXACIN (LEVAQUIN) 500 MG TABLET    Take 1 tablet (500 mg total) by mouth daily.   METHYLPREDNISOLONE (MEDROL DOSEPAK) 4 MG TABLET    Take as directed

## 2012-07-03 NOTE — Patient Instructions (Addendum)
Today we updated your med list in our EPIC system...    Continue your current medications the same...  Today we decided to treat your BRONCHITIS with>>    Augmentin 875mg - one tab twice daily til gone...    MUCINEX 600mg  OTC- 2 tabs twice daily w/ fluids to loosen the thick phlegm...     OK to use Tylenol as needed..    Continue the ALIGN daily...  We will contact DrEarly to check your CXR when he sees you on 6/17 to see if you need a CT scan for further eval to check the Aorta...  Call for any questions...  Let's plan a follow up visit in 4-31mo, sooner if needed for problems.Marland KitchenMarland Kitchen

## 2012-07-04 ENCOUNTER — Telehealth: Payer: Self-pay | Admitting: Cardiovascular Disease

## 2012-07-04 NOTE — Telephone Encounter (Signed)
Spoke with pt.  He is on Augmentin 875mg  x 7 days.  He is scheduled to follow up in clinic on 6/2.  Will continue current dose of Coumadin and follow up next week.

## 2012-07-04 NOTE — Telephone Encounter (Signed)
New Prob      Pt calling in about his coumadin. Currently on an antibiotic and wants to know if coumadin dosage should be changed.

## 2012-07-05 ENCOUNTER — Other Ambulatory Visit: Payer: Self-pay | Admitting: *Deleted

## 2012-07-05 DIAGNOSIS — R9389 Abnormal findings on diagnostic imaging of other specified body structures: Secondary | ICD-10-CM

## 2012-07-09 ENCOUNTER — Ambulatory Visit (INDEPENDENT_AMBULATORY_CARE_PROVIDER_SITE_OTHER): Payer: Medicare Other | Admitting: Pharmacist

## 2012-07-09 DIAGNOSIS — Z7901 Long term (current) use of anticoagulants: Secondary | ICD-10-CM

## 2012-07-09 DIAGNOSIS — I82409 Acute embolism and thrombosis of unspecified deep veins of unspecified lower extremity: Secondary | ICD-10-CM

## 2012-07-09 DIAGNOSIS — I4891 Unspecified atrial fibrillation: Secondary | ICD-10-CM

## 2012-07-09 DIAGNOSIS — I2699 Other pulmonary embolism without acute cor pulmonale: Secondary | ICD-10-CM

## 2012-07-09 LAB — POCT INR: INR: 3.5

## 2012-07-17 ENCOUNTER — Ambulatory Visit (INDEPENDENT_AMBULATORY_CARE_PROVIDER_SITE_OTHER): Payer: Medicare Other | Admitting: Pharmacist

## 2012-07-17 ENCOUNTER — Other Ambulatory Visit: Payer: Self-pay | Admitting: Vascular Surgery

## 2012-07-17 DIAGNOSIS — I2699 Other pulmonary embolism without acute cor pulmonale: Secondary | ICD-10-CM

## 2012-07-17 DIAGNOSIS — I82409 Acute embolism and thrombosis of unspecified deep veins of unspecified lower extremity: Secondary | ICD-10-CM

## 2012-07-17 DIAGNOSIS — Z7901 Long term (current) use of anticoagulants: Secondary | ICD-10-CM

## 2012-07-17 DIAGNOSIS — I4891 Unspecified atrial fibrillation: Secondary | ICD-10-CM

## 2012-07-17 LAB — POCT INR: INR: 2.2

## 2012-07-18 LAB — BUN: BUN: 18 mg/dL (ref 6–23)

## 2012-07-20 ENCOUNTER — Telehealth: Payer: Self-pay | Admitting: Pulmonary Disease

## 2012-07-20 MED ORDER — SULFAMETHOXAZOLE-TRIMETHOPRIM 800-160 MG PO TABS: 1.0000 | ORAL_TABLET | Freq: Two times a day (BID) | ORAL | Status: DC

## 2012-07-20 MED ORDER — METHYLPREDNISOLONE 4 MG PO KIT: PACK | ORAL | Status: DC

## 2012-07-20 NOTE — Telephone Encounter (Signed)
Per SN---   He was given augmentin, mucinex and fluids, he refused levaquin.   recs are:  Septra ds  #14  1 po bid Medrol dosepak  #1  Take as directed Diflucan 100 mg  #8  2 today then 1 daily until gone.

## 2012-07-20 NOTE — Telephone Encounter (Signed)
I spoke with pt. He c/o cough w/ yellow-clear-cream-grey phlem, sinus drainage, wheezing. This has been going on x may. [name] taking mucinex 1200 mg BID. He also has yeat on his buttox d/t rash and was giving cream for this. [name] requesting recs for this. Please advise sn thanks Last OV 07/03/12  Allergies  Allergen Reactions  . Doxycycline     Rash

## 2012-07-20 NOTE — Telephone Encounter (Signed)
Pt returned call. Glenn Barron °

## 2012-07-20 NOTE — Telephone Encounter (Signed)
Called, spoke with pt.  Informed him of below recs per Dr. Kriste Basque.   He verbalized understanding of this. Pt states he did finish the augmentin on June 3. He would like to hold off on sending in the difflucan.  States he has a cream that he is using for his buttocks which seems to be working.  He started this yesterday.  Pt will call back if rash becomes worse or does not clear up with the cream. He is aware septra ds and medrol dosepak sent to [facility] and to call back with any further questions or concerns or if symptoms do not improve or worsen.  ** Per pt, coumadin clinic is managing his coumadin.  He is aware to let them know he is on abx.

## 2012-07-20 NOTE — Telephone Encounter (Signed)
ATC PT NA unable to leave VM WCB 

## 2012-07-23 ENCOUNTER — Encounter: Payer: Self-pay | Admitting: Vascular Surgery

## 2012-07-23 ENCOUNTER — Telehealth: Payer: Self-pay | Admitting: Cardiovascular Disease

## 2012-07-23 NOTE — Telephone Encounter (Signed)
Pt started on Septra DS on 07/21/12 BID for 7 days, F/u appt scheduled for Wednesday.

## 2012-07-23 NOTE — Telephone Encounter (Signed)
New Prob    Pt was put on an antibiotic and hes calling to see if will have an impact on his COUMADIN dosages. Please call.

## 2012-07-24 ENCOUNTER — Encounter (INDEPENDENT_AMBULATORY_CARE_PROVIDER_SITE_OTHER): Payer: Medicare Other | Admitting: *Deleted

## 2012-07-24 ENCOUNTER — Encounter: Payer: Self-pay | Admitting: Vascular Surgery

## 2012-07-24 ENCOUNTER — Ambulatory Visit
Admission: RE | Admit: 2012-07-24 | Discharge: 2012-07-24 | Disposition: A | Discharge status: Home / Self Care | Payer: Medicare Other | Source: Ambulatory Visit | Attending: Neurosurgery | Admitting: Neurosurgery

## 2012-07-24 ENCOUNTER — Ambulatory Visit (INDEPENDENT_AMBULATORY_CARE_PROVIDER_SITE_OTHER): Payer: Medicare Other | Admitting: Vascular Surgery

## 2012-07-24 ENCOUNTER — Ambulatory Visit: Payer: Medicare Other | Admitting: Pulmonary Disease

## 2012-07-24 ENCOUNTER — Ambulatory Visit
Admission: RE | Admit: 2012-07-24 | Discharge: 2012-07-24 | Disposition: A | Discharge status: Home / Self Care | Payer: Medicare Other | Source: Ambulatory Visit | Attending: Vascular Surgery | Admitting: Vascular Surgery

## 2012-07-24 VITALS — BP 149/97 | HR 72 | Resp 18 | Ht 74.0 in | Wt 165.0 lb

## 2012-07-24 DIAGNOSIS — R9389 Abnormal findings on diagnostic imaging of other specified body structures: Secondary | ICD-10-CM

## 2012-07-24 DIAGNOSIS — Z48812 Encounter for surgical aftercare following surgery on the circulatory system: Secondary | ICD-10-CM

## 2012-07-24 DIAGNOSIS — I714 Abdominal aortic aneurysm, without rupture: Secondary | ICD-10-CM

## 2012-07-24 DIAGNOSIS — I724 Aneurysm of artery of lower extremity: Secondary | ICD-10-CM

## 2012-07-24 MED ORDER — IOHEXOL 350 MG/ML SOLN
100.0000 mL | Freq: Once | INTRAVENOUS | Status: AC | PRN
  Administered 2012-07-24: 100 mL via INTRAVENOUS

## 2012-07-24 NOTE — Progress Notes (Signed)
The patient has today for followup of his diffuse aneurysmal disease. Most recently had a stent graft repair of a very large left distal superficial femoral and popliteal artery aneurysm in April of 2013. This is reached 11 cm. He is here today with his daughter. He has no new difficulties referable to his aneurysmal disease. He has had some pulmonary difficulty and is being treated with antibiotics for this. He is on chronic Coumadin therapy.  Past Medical History  Diagnosis Date  . Allergic rhinitis, cause unspecified   . Unspecified chronic bronchitis   . Other pulmonary embolism and infarction   . Paroxysmal a-fib   . Cerebrovascular disease, unspecified 10/2002    right side weakness  . Peripheral vascular disease, unspecified   . Unspecified venous (peripheral) insufficiency   . Acute venous embolism and thrombosis of unspecified deep vessels of lower extremity   . Hyperlipidemia   . GERD (gastroesophageal reflux disease)   . Diverticulosis of colon (without mention of hemorrhage)   . Irritable bowel syndrome   . Benign neoplasm of colon   . Malignant neoplasm of prostate   . Osteoarthrosis, unspecified whether generalized or localized, unspecified site   . Unspecified cerebral artery occlusion with cerebral infarction   . Unspecified hereditary and idiopathic peripheral neuropathy   . Depression   . Hypertension     Dr. Eden Emms cardiologist   . Right bundle branch block   . Asthma   . History of scarlet fever   . Aortic stenosis, mild     mild by echo 04/2011    History  Substance Use Topics  . Smoking status: Never Smoker   . Smokeless tobacco: Never Used  . Alcohol Use: No    Family History  Problem Relation Age of Onset  . Anesthesia problems Neg Hx   . Heart attack Father   . Hyperlipidemia Mother   . Hypertension Mother   . Hypertension Sister     Allergies  Allergen Reactions  . Doxycycline     Rash    Current outpatient prescriptions:acetaminophen  (TYLENOL) 650 MG CR tablet, Take 650 mg by mouth 2 (two) times daily., Disp: , Rfl: ;  ADVAIR DISKUS 250-50 MCG/DOSE AEPB, inhale 1 puff INTO THE LUNGS twice a day RINSE MOUTH WELL, Disp: 60 each, Rfl: 11;  aspirin 81 MG tablet, Take 81 mg by mouth daily.  , Disp: , Rfl: ;  atorvastatin (LIPITOR) 20 MG tablet, take 1 tablet by mouth once daily, Disp: 30 tablet, Rfl: 11 calcium carbonate (TUMS - DOSED IN MG ELEMENTAL CALCIUM) 500 MG chewable tablet, As needed , Disp: , Rfl: ;  cholecalciferol (VITAMIN D) 1000 UNITS tablet, Take 1,000 Units by mouth daily.  , Disp: , Rfl: ;  famotidine (PEPCID) 10 MG tablet, Take 10 mg by mouth at bedtime.  , Disp: , Rfl: ;  fluticasone (FLONASE) 50 MCG/ACT nasal spray, instill 2 sprays into each nostril once daily, Disp: 16 g, Rfl: 11 furosemide (LASIX) 40 MG tablet, Take 2  tablets by mouth in the morning and 1 tablet in the evening, Disp: , Rfl: ;  loratadine (CLARITIN) 10 MG tablet, Take 10 mg by mouth daily., Disp: , Rfl: ;  methylPREDNISolone (MEDROL DOSEPAK) 4 MG tablet, follow package directions, Disp: 21 tablet, Rfl: 0;  polyethylene glycol (MIRALAX / GLYCOLAX) packet, Take 17 g by mouth as needed., Disp: , Rfl:  potassium chloride SA (KLOR-CON M20) 20 MEQ tablet, , Disp: , Rfl: ;  predniSONE (DELTASONE) 10 MG tablet,  Take 5 mg by mouth every other day., Disp: , Rfl: ;  PROAIR HFA 108 (90 BASE) MCG/ACT inhaler, INHALE 1 TO 2 PUFFS INTO THE LUNGS EVERY 6 HOURS AS NEEDED., Disp: 8.5 g, Rfl: 11;  PROBIOTIC CAPS, Take 1 capsule by mouth daily. , Disp: , Rfl:  sulfamethoxazole-trimethoprim (SEPTRA DS) 800-160 MG per tablet, Take 1 tablet by mouth 2 (two) times daily., Disp: 14 tablet, Rfl: 0;  warfarin (COUMADIN) 4 MG tablet, TAKE AS DIRECTED BY COUMADIN CLINIC, Disp: 100 tablet, Rfl: 2;  amoxicillin-clavulanate (AUGMENTIN) 875-125 MG per tablet, Take 1 tablet by mouth 2 (two) times daily., Disp: 14 tablet, Rfl: 0  BP 149/97  Pulse 72  Resp 18  Ht 6\' 2"  (1.88 m)  Wt 165  lb (74.844 kg)  BMI 21.18 kg/m2  Body mass index is 21.18 kg/(m^2).       Physical exam alert frail appearing gentleman in no acute distress Abdomen is soft nontender no masses noted Neurologically he is grossly intact His left popliteal pulses patent. The huge expansile mass that he had in his left distal thigh has completely resolved  CT scan of his chest abdomen and pelvis reviewed diffuse aneurysmal disease beginning in his descending arch at 4-1/2 cm and extending throughout. There is no significant change in studies from 6 months ago.  Non-invasive studies today in our office reveal incompressible tibial vessels therefore ankle arm indices are not obtainable. He has normal left toe brachial index of 0.71 and slightly dented 0.46 on the right  Impression and plan: Diffuse aneurysmal disease. All stable. I again discussed the symptoms of concern with the patient. He'll notify should this occur. Otherwise we'll see him in one year with repeat CT scan of his abdomen pelvis and runoff

## 2012-07-25 ENCOUNTER — Other Ambulatory Visit: Payer: Self-pay | Admitting: *Deleted

## 2012-07-25 ENCOUNTER — Ambulatory Visit (INDEPENDENT_AMBULATORY_CARE_PROVIDER_SITE_OTHER): Payer: Medicare Other | Admitting: *Deleted

## 2012-07-25 DIAGNOSIS — I4891 Unspecified atrial fibrillation: Secondary | ICD-10-CM

## 2012-07-25 DIAGNOSIS — I724 Aneurysm of artery of lower extremity: Secondary | ICD-10-CM

## 2012-07-25 DIAGNOSIS — Z48812 Encounter for surgical aftercare following surgery on the circulatory system: Secondary | ICD-10-CM

## 2012-07-25 DIAGNOSIS — Z7901 Long term (current) use of anticoagulants: Secondary | ICD-10-CM

## 2012-07-25 DIAGNOSIS — I2699 Other pulmonary embolism without acute cor pulmonale: Secondary | ICD-10-CM

## 2012-07-25 DIAGNOSIS — I82409 Acute embolism and thrombosis of unspecified deep veins of unspecified lower extremity: Secondary | ICD-10-CM

## 2012-07-25 LAB — POCT INR: INR: 5

## 2012-07-30 ENCOUNTER — Encounter: Payer: Self-pay | Admitting: Vascular Surgery

## 2012-08-02 ENCOUNTER — Other Ambulatory Visit: Payer: Self-pay | Admitting: Pulmonary Disease

## 2012-08-03 ENCOUNTER — Ambulatory Visit (INDEPENDENT_AMBULATORY_CARE_PROVIDER_SITE_OTHER): Payer: Medicare Other | Admitting: *Deleted

## 2012-08-03 DIAGNOSIS — I4891 Unspecified atrial fibrillation: Secondary | ICD-10-CM

## 2012-08-03 DIAGNOSIS — I82409 Acute embolism and thrombosis of unspecified deep veins of unspecified lower extremity: Secondary | ICD-10-CM

## 2012-08-03 DIAGNOSIS — I2699 Other pulmonary embolism without acute cor pulmonale: Secondary | ICD-10-CM

## 2012-08-03 DIAGNOSIS — Z7901 Long term (current) use of anticoagulants: Secondary | ICD-10-CM

## 2012-08-24 ENCOUNTER — Ambulatory Visit (INDEPENDENT_AMBULATORY_CARE_PROVIDER_SITE_OTHER): Payer: Medicare Other | Admitting: Pharmacist

## 2012-08-24 DIAGNOSIS — I2699 Other pulmonary embolism without acute cor pulmonale: Secondary | ICD-10-CM

## 2012-08-24 DIAGNOSIS — I4891 Unspecified atrial fibrillation: Secondary | ICD-10-CM

## 2012-08-24 DIAGNOSIS — Z7901 Long term (current) use of anticoagulants: Secondary | ICD-10-CM

## 2012-08-24 DIAGNOSIS — I82409 Acute embolism and thrombosis of unspecified deep veins of unspecified lower extremity: Secondary | ICD-10-CM

## 2012-09-03 ENCOUNTER — Other Ambulatory Visit: Payer: Self-pay | Admitting: Pulmonary Disease

## 2012-09-10 ENCOUNTER — Other Ambulatory Visit: Payer: Self-pay | Admitting: Pulmonary Disease

## 2012-09-19 ENCOUNTER — Other Ambulatory Visit: Payer: Self-pay | Admitting: Pulmonary Disease

## 2012-09-21 ENCOUNTER — Ambulatory Visit (INDEPENDENT_AMBULATORY_CARE_PROVIDER_SITE_OTHER): Payer: Medicare Other | Admitting: *Deleted

## 2012-09-21 DIAGNOSIS — I2699 Other pulmonary embolism without acute cor pulmonale: Secondary | ICD-10-CM

## 2012-09-21 DIAGNOSIS — Z7901 Long term (current) use of anticoagulants: Secondary | ICD-10-CM

## 2012-09-21 DIAGNOSIS — I4891 Unspecified atrial fibrillation: Secondary | ICD-10-CM

## 2012-09-21 DIAGNOSIS — I82409 Acute embolism and thrombosis of unspecified deep veins of unspecified lower extremity: Secondary | ICD-10-CM

## 2012-09-21 LAB — POCT INR: INR: 2.4

## 2012-10-15 ENCOUNTER — Ambulatory Visit (INDEPENDENT_AMBULATORY_CARE_PROVIDER_SITE_OTHER): Payer: Medicare Other | Admitting: Cardiovascular Disease

## 2012-10-15 ENCOUNTER — Ambulatory Visit (INDEPENDENT_AMBULATORY_CARE_PROVIDER_SITE_OTHER): Payer: Medicare Other | Admitting: *Deleted

## 2012-10-15 ENCOUNTER — Encounter: Payer: Self-pay | Admitting: Cardiovascular Disease

## 2012-10-15 VITALS — BP 146/91 | HR 75 | Wt 169.0 lb

## 2012-10-15 DIAGNOSIS — I4891 Unspecified atrial fibrillation: Secondary | ICD-10-CM

## 2012-10-15 DIAGNOSIS — I350 Nonrheumatic aortic (valve) stenosis: Secondary | ICD-10-CM

## 2012-10-15 DIAGNOSIS — I724 Aneurysm of artery of lower extremity: Secondary | ICD-10-CM

## 2012-10-15 DIAGNOSIS — I359 Nonrheumatic aortic valve disorder, unspecified: Secondary | ICD-10-CM

## 2012-10-15 DIAGNOSIS — Z7901 Long term (current) use of anticoagulants: Secondary | ICD-10-CM

## 2012-10-15 DIAGNOSIS — I82409 Acute embolism and thrombosis of unspecified deep veins of unspecified lower extremity: Secondary | ICD-10-CM

## 2012-10-15 DIAGNOSIS — I10 Essential (primary) hypertension: Secondary | ICD-10-CM

## 2012-10-15 DIAGNOSIS — I2699 Other pulmonary embolism without acute cor pulmonale: Secondary | ICD-10-CM

## 2012-10-15 LAB — POCT INR: INR: 2.3

## 2012-10-15 NOTE — Assessment & Plan Note (Signed)
No change in murmur  Mild by echo in June of 2013

## 2012-10-15 NOTE — Patient Instructions (Signed)
Your physician wants you to follow-up in: YEAR WITH DR NISHAN  You will receive a reminder letter in the mail two months in advance. If you don't receive a letter, please call our office to schedule the follow-up appointment.  Your physician recommends that you continue on your current medications as directed. Please refer to the Current Medication list given to you today. 

## 2012-10-15 NOTE — Assessment & Plan Note (Signed)
Good rate control and anticoagulation  

## 2012-10-15 NOTE — Progress Notes (Signed)
Patient ID: Glenn Barron, male   DOB: 03-11-1922, 79 y.o.   MRN: [id number] Glenn Barron is seen today for F/U of multiple issues. He had a tough year and his wife passed a year ago He has home care helping a lot now. He has a history of PAF, TIA, AAA with Aobifem. He is still seeing the coumadin clinic. His HTN and lipids are under good control. He is still depressed about his wifes death. Sotolol stopped He has SSS with relative bradycardia and some wenkebach on ECG. He has some dizzyness but no frank syncope and seems to ambulated marginally with a walker. He does not want a pacer and currently there is not a firm indication for one  Has had an expanding left popliteal aneurysm That was repaired by Dr Arbie Cookey using Stent graft with Emeline Darling Viabahn stent graft in 4/13  Has afib with chronic coumadin. Can be held 4-5 days before with no bridge. Has had IVC filter 12 years ago or so for post op DVT   INR today 2.3    Uses wheel chair and has home aids  Mobility is ok   ROS: Denies fever, malais, weight loss, blurry vision, decreased visual acuity, cough, sputum, SOB, hemoptysis, pleuritic pain, palpitaitons, heartburn, abdominal pain, melena, lower extremity edema, claudication, or rash.  All other systems reviewed and negative  General: Affect appropriate Elderly frail male  HEENT: normal Neck supple with no adenopathy JVP normal no bruits no thyromegaly Lungs clear with no wheezing and good diaphragmatic motion Heart:  S1/S2 mild AS  murmur, no rub, gallop or click PMI normal Abdomen: benighn, BS positve, no tenderness, no AAA no bruit.  No HSM or HJR Distal pulses intact with no bruits No edema Neuro non-focal Skin warm and dry No muscular weakness   Current Outpatient Prescriptions  Medication Sig Dispense Refill  . acetaminophen (TYLENOL) 650 MG CR tablet Take 650 mg by mouth 2 (two) times daily.      Marland Kitchen ADVAIR DISKUS 250-50 MCG/DOSE AEPB inhale 1 puff INTO THE LUNGS 2 TIMES A DAY.RINSE  MOUTH WELL  60 each  11  . aspirin 81 MG tablet Take 81 mg by mouth daily.        Marland Kitchen atorvastatin (LIPITOR) 20 MG tablet take 1 tablet by mouth once daily  30 tablet  11  . calcium carbonate (TUMS - DOSED IN MG ELEMENTAL CALCIUM) 500 MG chewable tablet As needed       . cholecalciferol (VITAMIN D) 1000 UNITS tablet Take 1,000 Units by mouth daily.        . famotidine (PEPCID) 10 MG tablet Take 10 mg by mouth at bedtime.        . fluticasone (FLONASE) 50 MCG/ACT nasal spray instill 2 sprays into each nostril once daily  16 g  11  . furosemide (LASIX) 40 MG tablet 1 AM 1 PM      . loratadine (CLARITIN) 10 MG tablet Take 10 mg by mouth daily.      . polyethylene glycol (MIRALAX / GLYCOLAX) packet Take 17 g by mouth as needed.      . potassium chloride SA (KLOR-CON M20) 20 MEQ tablet Take 20 mEq by mouth daily.       . predniSONE (DELTASONE) 10 MG tablet       . PROAIR HFA 108 (90 BASE) MCG/ACT inhaler INHALE 1 TO 2 PUFFS INTO THE LUNGS EVERY 6 HOURS AS NEEDED.  8.5 g  11  . PROBIOTIC CAPS Take 1  capsule by mouth daily.       Marland Kitchen warfarin (COUMADIN) 4 MG tablet TAKE AS DIRECTED BY COUMADIN CLINIC  100 tablet  2   No current facility-administered medications for this visit.    Allergies  Doxycycline  Electrocardiogram:  afib rate 75  RBBB    Assessment and Plan

## 2012-10-15 NOTE — Assessment & Plan Note (Signed)
S/P stent graft repair  F/U with Dr Arbie Cookey

## 2012-10-15 NOTE — Assessment & Plan Note (Signed)
Filter in place on chronic coumadin

## 2012-10-15 NOTE — Assessment & Plan Note (Signed)
Well controlled.  Continue current medications and low sodium Dash type diet.    

## 2012-10-24 ENCOUNTER — Other Ambulatory Visit: Payer: Self-pay | Admitting: Pulmonary Disease

## 2012-11-07 ENCOUNTER — Encounter: Payer: Self-pay | Admitting: Pulmonary Disease

## 2012-11-07 ENCOUNTER — Ambulatory Visit (INDEPENDENT_AMBULATORY_CARE_PROVIDER_SITE_OTHER): Payer: Medicare Other | Admitting: Pulmonary Disease

## 2012-11-07 VITALS — BP 108/80 | HR 67 | Temp 98.3°F | Ht 74.0 in | Wt 172.2 lb

## 2012-11-07 DIAGNOSIS — M1990 Unspecified osteoarthritis, unspecified site: Secondary | ICD-10-CM

## 2012-11-07 DIAGNOSIS — R269 Unspecified abnormalities of gait and mobility: Secondary | ICD-10-CM

## 2012-11-07 DIAGNOSIS — D126 Benign neoplasm of colon, unspecified: Secondary | ICD-10-CM

## 2012-11-07 DIAGNOSIS — I739 Peripheral vascular disease, unspecified: Secondary | ICD-10-CM

## 2012-11-07 DIAGNOSIS — R609 Edema, unspecified: Secondary | ICD-10-CM

## 2012-11-07 DIAGNOSIS — I872 Venous insufficiency (chronic) (peripheral): Secondary | ICD-10-CM

## 2012-11-07 DIAGNOSIS — C61 Malignant neoplasm of prostate: Secondary | ICD-10-CM

## 2012-11-07 DIAGNOSIS — I724 Aneurysm of artery of lower extremity: Secondary | ICD-10-CM

## 2012-11-07 DIAGNOSIS — G609 Hereditary and idiopathic neuropathy, unspecified: Secondary | ICD-10-CM

## 2012-11-07 DIAGNOSIS — I679 Cerebrovascular disease, unspecified: Secondary | ICD-10-CM

## 2012-11-07 DIAGNOSIS — K589 Irritable bowel syndrome without diarrhea: Secondary | ICD-10-CM

## 2012-11-07 DIAGNOSIS — E785 Hyperlipidemia, unspecified: Secondary | ICD-10-CM

## 2012-11-07 DIAGNOSIS — K219 Gastro-esophageal reflux disease without esophagitis: Secondary | ICD-10-CM

## 2012-11-07 DIAGNOSIS — I4891 Unspecified atrial fibrillation: Secondary | ICD-10-CM

## 2012-11-07 DIAGNOSIS — J42 Unspecified chronic bronchitis: Secondary | ICD-10-CM

## 2012-11-07 DIAGNOSIS — K5730 Diverticulosis of large intestine without perforation or abscess without bleeding: Secondary | ICD-10-CM

## 2012-11-07 DIAGNOSIS — I714 Abdominal aortic aneurysm, without rupture: Secondary | ICD-10-CM

## 2012-11-07 NOTE — Progress Notes (Signed)
Subjective:    Patient ID: Glenn Barron, male    DOB: [birth year], 79 y.o.   MRN: [id number]  HPI 79 y/o WM here for a follow up visit... he has mult med problems as noted below... Followed for general medical purposes w/ hx of recurrent bronchitis & allergies; hx PTE/ DVT/ & IVC filter on Coumadin; HBP; AFib; cerebrovasc dis w/ hx stroke & prev right CAE; ASPVD w/ prev AAA repair & subseq bilat Ao-iliac bypasses; Hyperchol; Prostate cancer; neuropathy; and anxiety/ depression...  ~  February 22, 2011:  6wk ROV & add-on for f/u cellulitis> he saw TP 12/12 w/ acute swelling & redness right foot/ ankle/ leg, area is tender, no broken skin or trauma reported, no drainage, denies fever, no CP/ SOB, etc;  [name] already on Coumadin, followed in the CC & therapeutic, he had IVC filter placed yrs ago; Treated w/ Doxy po & infective symptoms improved> less red/ inflammed etc but still swollen- VI w/ edema up to his thigh on Lasix 40mg - 2Qam...  We decided to incr lasix to 80mg  Bid, 2gm Na+ diet (he's been eating TV dinners, soups, etc), elevation, etc... Plan f/u w/ labs in 9month.    Labs 12/12 showed Chems- ok w/ BUN=19, Creat=1.9, BNP=237;  CBC w/ Hg=15.5, WBC=10.9;  Wound cult- neg, no staph etc;  XRay right foot showed soft tissue swelling, no bone abn seen...  ~  March 24, 2011:  48mo ROV> and he has lost 10# down to 161# on the Lasix 80mg Bid w/ corresponding decr edema but he has a swollen ?indurated mass-like area palp in the post aspect of the left thigh above the knee & pop fossa; recall hx of pop art aneurysm prev measured at 5cm by DrEarly & viewed as inoperable; this lesion needs re-assessment & before we consider scans etc we will get DrEarly to recheck for his opinion...     Labs today showed:  Chems- ok w/ BUN=32, Creat=1.1, K=5.0, BNP=159;  CBC- ok w/ Hg=16.1, WBC=9.6.Marland KitchenMarland Kitchen We decided to decr the Lasix40 slightly to 2Qam & 1Qpm...  Plan ROV 6weeks.  ~  May 03, 2011:  6week ROV & he has seen  DrEarly w/ CT Angio done 2/13 w/ signif enlargement of aneurysm in the distal SFA & Pop art; mult other aneurysms seen as well & several are bigger as well (see report); he has also had f/u DrNishan 3/13 & EKG shows AFib, rate74, RBBB, septal infarct & poor R progression... 2DEcho 3/13 showed mod LVH, norm LVF w/ EF=55-60%, mild AS, mild MR, mod LAdil measuring 52mm...  OK for surgery>> LABS 3/13:  Chems- stable w/ BUN=27 Creat=1.1;  CBC- ok w/ Hg=15.7;  BNP= 171;  Protime- 32.2/ INR=2.9 & we called to CC to adjust down...  ~  Jun 30, 2011:  84mo ROV & post hosp check> Jaquan had Stent Graft repair of large left SFA/ popliteal art aneurysm 4/13 by DrEarly; he has done well post op- bruising resolved, swelling improving, etc- f/u CTA is planned soon;  He is still quite fragile & as prev noted he has other aneurysms- bilat hypogastrics, bilat common femorals, bilat popliteals...  We reviewed his prob list, meds, xrays & labs> see below>> he is back on Coumadin via CC...  ~  September 27, 2011:  16mo ROV & Bradie has been stable, notes that he had skin cancer removed from his left arm recently; his CC today are his "allergies" & he has OTC Zyrtek for prn use, plus Flonase &  his Pred;  His leg swelling is decreased/ improved w/ 2+lft & 1+rt;  Other medical problems as noted below...    We reviewed prob list, meds, xrays and labs> see below for updates >>  ~  January 24, 2012:  56mo ROV & Benney is overall stable- he had ROV w/ Belva Crome 12/13 w/ dopplers showing patent stent graft to left leg;  His breathing is stable on Advair250 & Pred- 5mg  Qod;  BP looks good & measures 114/70 on Lasix80+K20Bid;  FLP is well controlled on his diet & Lip20;  Finally GI is managed well w/ Pepcid, Miralax, Probiotics...    reviewed prob list, meds, xrays and labs> see below for updates >>  LABS 12/13:  FLP- at goals on Lip20;  Chems- ok w/ Creat=1.2;  CBC- wnl;  TSH=3.56;  PSA=0.10   ~  Jul 03, 2012:  53mo ROV & Joaovictor  presents w/ cough, yellow/brown sput, low grade temp (100.1 today), & some SOB/wheezing; he noted that his legs were swelling so he increased his Lasix40 from 2tabs to 3 on his own; weight is actually down 10# & BP 116/82, O2sat = 95% on RA;  CXR shows mild cardiomeg, bilat scarring but no acute infiltrate, there is a bulge in the prox desc thoracic aorta (more prom than 2010 films) and radiology rec CTAngio but pt wants to wait for his f/u appt w/ DrEarly in June to discuss this;  We decided to treat for Bronchitis w/ Augmentin, Mucinex, Fluids- he declines Levaquin Rx ...     He has hx severe periph vasc dis followed by DrEarly> s/p AAA in 2000 & Ao to bilat iliac bypass grafts in 2001; then had stent graft repair of large left SFA/ Popliteal art aneurysm 4/13; due for f/u studies by DrEarly soon.,..    We reviewed prob list, meds, xrays and labs> see below for updates >>  LABS 5/14:  Chems- wnl w/ Cr=1.1 CXR 5/14 shows mild cardiomeg, bilat scarring but no acute infiltrate, there is a bulge in the prox desc thoracic aorta (more prom than 2010 films)...  ~  November 07, 2012:  56mo ROV & Josedejesus is overall stable & here w/ his daughter from [place] today; he has mult somatic complaints- eg. Cough & drainage that he thinks is from allergy to hi fiber cereal he eats in the AM; he notes that little particles hang in his throat & are hard to swallow, cough produces sm amt of sl yellow sput "it's an emulsion", he describes his food intake in great detail & tries to avoid salt, he is less mobile, uses vaseline on his hems, has urinary incont & uses 8-10 pads/d "ever since the aneurysm surg...     He has hx recurrent bronchitis/ AB on Advair250Bid, Pred10mg /d, Mucinex1-2Bid, Claritin/ Flonase, AlbutHFA prn...     He saw Walker Kehr 9/14> hx AFib (rate control), TIA, AAA w/ Aobifem & Pop art aneuryms stent graft surg; on Coumadin via CC w/ hx DVT & filter in place; BP & Lipids doing well; noted to be frail- no  ch in meds & f/u 20yr...    He has signif Ven Insuffic & bilat edema L>R on Lasix40-2/d & K20 daily...     He saw DrEarly 6/14> diffuse aneurysmal dis, s/p stent graft repair of 11cm left pop art aneurysm 4/13, noted to be frail- CT Chest/ Abd/ Pelvis shows diffuse aneurysmal dis starting in desc Ao at 4.5cm, no change from prev; non-invasive dopplers showed incompressible tibial  vessels, norm left TBI=0.71 and sl decr right TBI=0.46; they plan f/u 62yr w/ repeat scans...     He takes Lipitor20, Miralax, Probiotic, VitD, etc...  We reviewed prob list, meds, xrays and labs> see below for updates >>           Problem List:  OPHTHALMOLOGY - followed by [facility] eye clinic... ~  7/12:  He reports that recent check up was OK...  ALLERGIC RHINITIS (ICD-477.9) - on OTC Antihist Prn and FLONASE Qhs...  BRONCHITIS, RECURRENT (ICD-491.9) - on ADVAIR 250Bid, PROVENTIL HFA Prn, & PREDNISONE 5mg  Qod... ~  CXR 7/11 showed some hyperinflation, no change scarring & vol loss RLL, NAD.Marland Kitchen. ~  CXR 11/11 in hosp showed scarring right base w/ incr opac r/o superimposed pneum > he was placed on Pred during this adm & weaned slowly as outpt... ~  CXR 12/11 here showed baseline film w/ basilar scarring & NAD... on Pred 10mg /d & asked to wean to 5mg /d... ~  Continued wean of Pred to 5mg  Qod & pt remains stable w/o change in dyspnea, & w/o resp exac... ~  CXR 4/13 showed thoracic Ao ectasia, COPD w/ fibrotic changes & atx right base, osteopenia/ DDD/ scoliosis, filter in IVC... ~  CXR 5/14 mild cardiomeg, bilat scarring but no acute infiltrate, there is a bulge in the prox desc thoracic aorta (more prom than 2010 films) and radiology rec CTAngio but pt wants to wait for his f/u appt w/ DrEarly in June to discuss this.  Hx of PULMONARY EMBOLISM (ICD-415.19) - he had a DVT w/ PTE in 2001 w/ IVC filter placed... he remains on COUMADIN w/ regular checks in the Coumadin Clinic- doing well.  HYPERTENSION (ICD-401.9) - on LASIX  monotherapy (currently 80mg /d) w/ K20/d... ~  labs 10/10 showed BUN= 17, Creat= 1.0, K= 4.7 ~  11/10: c/o incr dizzy & weak- rec to decr the Lasix to 1/2 tab daily... ~  2/11:  hosp records show norm CBC, BMet, TSH, Urine. ~  7/11: c/o incr edema & wants to incr Lasix to 1-2 Qam; BUN=28, Creat=1.2, K=4.2, BNP=97 ~  Labs 11/11 in hosp on Lasix 40mg /d showed BUN/ Creat= 29/ 1.5 ==> 18/ 0.9 & BNP= 113. ~  4/12:  On Lasix40mg - 1.5 tabsQam & BP= 118/72; notes some fatigue, intermittent dyspnea, dizziness, & edema; but denies HA, visual changes, CP, palipit, syncope, etc; labs show K=4.6, BUN=22, Creat=1.1, rec incr Lasix to 80mg  Qam... ~  7/12:  BP= 124/82 on Lasix 80mg /d... ~  11/12:  BP= 122/76 and he denies CP, palpit, dizzy, ch in SOB or edema... ~  2/13:  BP= 110/62 on Lasix 80Bid; denies CP, palpit, dizzy, ch in SOB/DOE, note decr in edema & wt. ~  5/13:  BP= 128/76 & he is improving slowly ~  5/14: on Lasix40- 2AM & 1PM + K20; BP116/82, assoc w/ 10 lb wt loss... ~  10/14: on Lasix40-2/d and K20; BP= 110/80 & he denies CP, palpit, dizzy, ch in SOB or his edema...  ATRIAL FIBRILLATION (ICD-427.31) - prev on Sotalol80 but stopped 12/10 by DrNishan...  remains on COUMADIN via clinic... he was prev maintaining NSR but has slipped back into AFib w/ controlled VR... ~  Last 2DEcho 2004 showed EF 50-55% w/ infer HK, incr AoV thickness, mild AI, mild LAdil, no thrombus seen... ~  EKG 12/10 showed SBrady w/ RBBB & 1st degree AVB... ~  6/11:  Last seen by DrNishan> he was still regular w/ pulse= 62... ~  2/12: he  is in AFib clinically w/ controlled VR & HR=90, continues on Coumadin via CC ~  1/14:  He saw DrNishan> HxPAF/SSS on Coumadin (he has IVC filter in place >60yrs), RBBB, TIA, AAA w/ AoBifem, left popliteal aneurysm repaired by stent graft 4/13; no med changes- felt to be stable... ~  9/14: He saw Walker Kehr 9/14> hx AFib (rate control), TIA, AAA w/ Aobifem & Pop art aneuryms stent graft surg; on  Coumadin via CC w/ hx DVT & filter in place; BP & Lipids doing well; noted to be frail- no ch in meds & f/u 62yr.  CEREBROVASCULAR DISEASE (ICD-437.9) - on ASA 81mg /d in addition to the Coumadin...  ~  he is s/p right carotid endarterectomy 7/01 by Windell Moulding...  ~  MRI 8/04 showed intracranial atherosclerotic changes w/ marked ectasia of V-B sys, sm right vertebral w/ prox stenosis, & >50% left ICA stenosis...  ~  CT Brain 1/09 showed atrophy and chr microvacs ischemic changes & remote IC lacune... ~  CDoppler 5/09 showed patent right CAE w/ DPA, mild plaque in bulb, 0-39% bilat ICA stenoses- no change... ~  CDoppler 5/10 showed patent right CAE w/ DPA, stable mild left carotid dis, 0-39% bilat... ~  Follow up CDopplers per DrEarly, [facility]...  PERIPHERAL VASCULAR DISEASE & DIFFUSE ANEURYSMAL DISEASE - s/p AAA repair 8/00 & bilat Ao to iliac bypasses in 2001 by DrLawson> he had mult post-op complications and a long hosp course... 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  ~  2/13:  With loss of edema & decr 10# on Lasix80Bid> there is an indurated swollen area in the post aspect of left lower thigh above the pop fossa> pt referred to DrEarly to recheck pop art aneurysm... ~  4/13:  S/P stent graft repair of large 9cm left SFA/ Popliteal art aneurysm by DrEarly... 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 ~  12/13:  F/u by [facility]- Art Dopplers revealed patent left stent graft w/ normal ABI; calcif vessels noted on right... ~  6/14: He saw DrEarly 6/14>  diffuse aneurysmal dis, s/p stent graft repair of 11cm left pop art aneurysm 4/13, noted to be frail- CT Chest/ Abd/ Pelvis shows diffuse aneurysmal dis starting in desc Ao at 4.5cm, no change from prev; non-invasive dopplers showed incompressible tibial vessels, norm left TBI=0.71 and sl decr right TBI=0.46; they plan f/u 25yr w/ repeat scans. ~  CT Chest/ Abd/ Pelvis 6/14 showed NUMEROUS findings> Asc thor Ao is aneurysmal measuring 4.5cm, prox desc Ao measures 3.8cm, diffuse atherosclerotic plaques w/ irreg areas of wall adherent thrombus & irreg penetrating ulcer seen, multifocal aneurysms- some w/ growth; Other non-vasc findings include heterogen thyroid, cardiomeg & calcifAoV w/ coronary vasc calcif throughout, masslike scarring RLL w/o ch from prev, degen changes in shoulders, scoliosis, osteopenia, dvertics, bilat renal cysts w/ large one left kid lower pole...  VENOUS INSUFFICIENCY, CHRONIC (ICD-459.81) - chr ven insuffic due to his mult DVT episodes & prev IVC filter ?2001... he's had incr trouble w/ LE edema recently &  requires incr Lasix dosing... ~  Venous Dopplers right leg 8/09 showed no evid for DVT, no superfic clots, etc... ~  8/09 developed cellulitis right leg which resolved to Keflex & local care... ~  [facility] 2/11 by [facility] for left hand cellulitis... ~  [facility] 11/11 by [facility] for right arm cellulitis... ~  2/12:  exam w/ persist incr LE edema w/ pitting> rec to incr LASIX 40==>80mg /d. ~  12/12: episode of LLE cellulitis w/ incr edema, therapeutic INR, treated w/ Doxy & incr Lasix... ~  1-2/13: cellulitis resolved, incr edema rx w/ incr Lasix & he lost 10# on Lasix80Bid... ~  He has chronic venous insuffic & edema L>R, worse since his left pop art aneurysm surg...  HYPERLIPIDEMIA (ICD-272.4) - on LIPITOR 20mg /d & prev FLP at goal on this dose... ~  FLP 5/08 showed TChol 124, Tg 99, HDL 40, LDL 65 ~  FLP 5/09 showed TChol 120, TG 93, HDL 36, LDL 66 ~  FLP 10/10 showed TChol 123, TG 96, HDL 47,  LDL 57 ~  FLP 7/12 on Lip20 showed TChol 105, TG 69, HDL 55, LDL 36 ~  FLP 12/13 on Lip20 showed TChol 116, TG 85, HDL 53, LDL 46  GERD (ICD-530.81) - on PEPCID 20mg /d...  DIVERTICULOSIS OF COLON (ICD-562.10) IRRITABLE BOWEL SYNDROME (ICD-564.1) - he has diarrhea (uses Immodium) & constipation (uses Miralax/ Senakot-S) w/ a tough job regulating (uses ALIGN)... COLONIC POLYPS (ICD-211.3) - last colonoscopy 8/99 showed divertics, no recurrent polyps... last polyps removed 1996= adenomatous... also had incidental cecal lipoma seen.  Hx of PROSTATE CANCER (ICD-185) - s/p radical prostatectomy in 14-Jul-1992... he is followed by DrWrenn & has urinary incontinence for which he uses pads etc... ~  labs 10/10 showed PSA= 0.03 ~  labs 7/11 showed PSA= 0.04 ~  Labs 7/12 showed PSA= 0.08  DEGENERATIVE JOINT DISEASE (ICD-715.90) - he has a known lumbar disc disease and cervical spondylosis... he also takes Vit D 1000 u daily...  ~  labs 7/11 showed Vit D level = 30... continue OTC supplement.  Hx of STROKE (ICD-434.91) - he was hospitalized 8/04 w/ stroke and MRI showed acute left pontine infarct + diffuse intracranial atherosclerotic changes- see above- on ASA & Coumadin. ~  He had right Bell's Palsey 3/12 & went to ER then called [place] & we Rx w/ Valtrex & Pred Dosepak ==> resolved & back to normal...  PERIPHERAL NEUROPATHY (ICD-356.9) - he has LBP, neuropathy and a gait abnormality...  ANXIETY DEPRESSION (ICD-300.4) - wife, Kathie Rhodes, died 07/15/22 on hospice (severe pulm fibrosis from scleroderma/ CREST/ etc)...  HEALTH MAINTENANCE: he had SHINGLES vaccine 6/12 & we gave him the TDAP 7/12...   Past Surgical History  Procedure Laterality Date  . Prostateectomy    . Cataract extraction    . Carotid endarterectomy      right  . Abdominal aortic aneurysm repair    . Skin cancer excision    . Inguinal hernia repair  1991    bilateral    Outpatient Encounter Prescriptions as of 11/07/2012  Medication Sig  Dispense Refill  . acetaminophen (TYLENOL) 650 MG CR tablet Take 650 mg by mouth 2 (two) times daily.      Marland Kitchen ADVAIR DISKUS 250-50 MCG/DOSE AEPB inhale 1 puff INTO THE LUNGS 2 TIMES A DAY.RINSE MOUTH WELL  60 each  11  . aspirin 81 MG tablet Take 81 mg by mouth daily.        Marland Kitchen atorvastatin (LIPITOR) 20 MG tablet take 1  tablet by mouth once daily  30 tablet  11  . calcium carbonate (TUMS - DOSED IN MG ELEMENTAL CALCIUM) 500 MG chewable tablet As needed       . cholecalciferol (VITAMIN D) 1000 UNITS tablet Take 1,000 Units by mouth daily.        . famotidine (PEPCID) 10 MG tablet Take 10 mg by mouth at bedtime.        . fluticasone (FLONASE) 50 MCG/ACT nasal spray instill 2 sprays into each nostril once daily  16 g  11  . furosemide (LASIX) 40 MG tablet 1 AM 1 PM      . loratadine (CLARITIN) 10 MG tablet Take 10 mg by mouth daily.      . polyethylene glycol (MIRALAX / GLYCOLAX) packet Take 17 g by mouth as needed.      . potassium chloride SA (KLOR-CON M20) 20 MEQ tablet Take 20 mEq by mouth daily.       . predniSONE (DELTASONE) 10 MG tablet Take 10 mg by mouth daily.       Marland Kitchen PROAIR HFA 108 (90 BASE) MCG/ACT inhaler INHALE 1 TO 2 PUFFS INTO THE LUNGS EVERY 6 HOURS AS NEEDED.  8.5 g  11  . PROBIOTIC CAPS Take 1 capsule by mouth daily.       Marland Kitchen warfarin (COUMADIN) 4 MG tablet TAKE AS DIRECTED BY COUMADIN CLINIC  100 tablet  2   No facility-administered encounter medications on file as of 11/07/2012.    Allergies  Allergen Reactions  . Doxycycline     Rash    Current Medications, Allergies, Past Medical History, Past Surgical History, Family History, and Social History were reviewed in [employer] record.   Review of Systems         See HPI - all other systems neg except as noted... The patient complains of decreased hearing, dyspnea on exertion, peripheral edema, muscle weakness, difficulty walking, and depression.  The patient denies anorexia, fever, weight loss,  weight gain, vision loss, hoarseness, chest pain, syncope, prolonged cough, headaches, hemoptysis, abdominal pain, melena, hematochezia, severe indigestion/heartburn, hematuria, incontinence, suspicious skin lesions, transient blindness, unusual weight change, abnormal bleeding, enlarged lymph nodes, and angioedema.     Objective:   Physical Exam     WD, sl thin, frail appearing 79 y/o WM in NAD... GENERAL:  Alert & oriented; pleasant & cooperative... HEENT:  [place]/AT, EOM-wnl, PERRLA, EACs-clear, TMs-wnl, NOSE-clear, THROAT-clear & wnl. NECK:  Supple w/ fairROM; no JVD; s/p right CAE, no bruits; no thyromegaly or nodules palpated; no lymphadenopathy. CHEST: decr BS bilat- few rhonchi in RLL area, no consolidation, no wheezing... HEART:  sl irregular rhythm; w/o murmur, rubs, or gallop heard... ABDOMEN:  Soft & nontender; normal bowel sounds; no organomegaly or masses detected. EXT: without deformities, mod arthritic changes; ambulates w/ walker; +venous stasis, 2+edema noted. NEURO:  CN's intact;  gait abn; no focal neuro deficits,  +generalized weakness... DERM:  No lesions noted; no rash etc...  RADIOLOGY DATA:  Reviewed in the EPIC EMR & discussed w/ the patient...  LABORATORY DATA:  Reviewed in the EPIC EMR & discussed w/ the patient...     Assessment & Plan:    Hx Bronchitis>  Concerned about his cough, drainage, sl yellow sput but no fever etc; we discussed continuing his regular regimen...  HBP>  Remains under good control w/ his diet + Lasix monotherapy;  Now on 80mg AM & his Cr=1.1, continue same...  AFib>  Followed by Walker Kehr for cards,  on Coumadin via clinic w/ therapeutic INR, controlled VR...  VASC DISEASE>  He has Cerebrovasc dis & Periph vasc dis followed by Walker Kehr & [facility]- DrEarly, DrLawson et al;  He has diffuse aneurysmal disease w/ CT Chest/ Abd/ Pelvis done 6/14 w/ f/u DrEarly...  Venous Insuffic w/ Edema> Labs reviewed & satable on 80mg Qam...  Hx DVT w/ PTE in  2001 w/ IVC filter placed & on Coumadin followed in the CC ever since...  Hyperlipidemia>  Controlled on Lip20 + diet efforts...  GI> GERD, Divertics, IBS, Polyps>  Stable on Pepcid, Miralax, Align...  GU> Hx prostate cancer, urinary incont>  Followed by DrWrenn...  Hx Stroke>  This occurred in 2004, on ASA & Coumadin...  Hx of Bell's Palsey>  S/p Right Bell's Palsey 3/12, treated w/ Valtrex & Dosepak & resolved back to baseline...  Other medical problems as noted...     Patient's Medications  New Prescriptions   No medications on file  Previous Medications   ACETAMINOPHEN (TYLENOL) 650 MG CR TABLET    Take 650 mg by mouth 2 (two) times daily.   ADVAIR DISKUS 250-50 MCG/DOSE AEPB    inhale 1 puff INTO THE LUNGS 2 TIMES A DAY.RINSE MOUTH WELL   ASPIRIN 81 MG TABLET    Take 81 mg by mouth daily.     ATORVASTATIN (LIPITOR) 20 MG TABLET    take 1 tablet by mouth once daily   CALCIUM CARBONATE (TUMS - DOSED IN MG ELEMENTAL CALCIUM) 500 MG CHEWABLE TABLET    As needed    CHOLECALCIFEROL (VITAMIN D) 1000 UNITS TABLET    Take 1,000 Units by mouth daily.     FAMOTIDINE (PEPCID) 10 MG TABLET    Take 10 mg by mouth at bedtime.     FLUTICASONE (FLONASE) 50 MCG/ACT NASAL SPRAY    instill 2 sprays into each nostril once daily   FUROSEMIDE (LASIX) 40 MG TABLET    1 AM 1 PM   LORATADINE (CLARITIN) 10 MG TABLET    Take 10 mg by mouth daily.   POLYETHYLENE GLYCOL (MIRALAX / GLYCOLAX) PACKET    Take 17 g by mouth as needed.   POTASSIUM CHLORIDE SA (KLOR-CON M20) 20 MEQ TABLET    Take 20 mEq by mouth daily.    PREDNISONE (DELTASONE) 10 MG TABLET    Take 10 mg by mouth daily.    PROAIR HFA 108 (90 BASE) MCG/ACT INHALER    INHALE 1 TO 2 PUFFS INTO THE LUNGS EVERY 6 HOURS AS NEEDED.   PROBIOTIC CAPS    Take 1 capsule by mouth daily.    WARFARIN (COUMADIN) 4 MG TABLET    TAKE AS DIRECTED BY COUMADIN CLINIC  Modified Medications   No medications on file  Discontinued Medications   No medications on file

## 2012-11-07 NOTE — Patient Instructions (Addendum)
Today we updated your med list in our EPIC system...    Continue your current medications the same...  Call for any questions...  Let's plan a follow up visit in 4mo, sooner if needed for problems...   

## 2012-11-19 ENCOUNTER — Ambulatory Visit (INDEPENDENT_AMBULATORY_CARE_PROVIDER_SITE_OTHER): Payer: Medicare Other | Admitting: *Deleted

## 2012-11-19 DIAGNOSIS — I82409 Acute embolism and thrombosis of unspecified deep veins of unspecified lower extremity: Secondary | ICD-10-CM

## 2012-11-19 DIAGNOSIS — Z7901 Long term (current) use of anticoagulants: Secondary | ICD-10-CM

## 2012-11-19 DIAGNOSIS — I2699 Other pulmonary embolism without acute cor pulmonale: Secondary | ICD-10-CM

## 2012-11-19 DIAGNOSIS — I4891 Unspecified atrial fibrillation: Secondary | ICD-10-CM

## 2012-12-18 ENCOUNTER — Ambulatory Visit (INDEPENDENT_AMBULATORY_CARE_PROVIDER_SITE_OTHER): Payer: Medicare Other | Admitting: *Deleted

## 2012-12-18 DIAGNOSIS — Z7901 Long term (current) use of anticoagulants: Secondary | ICD-10-CM

## 2012-12-18 DIAGNOSIS — I2699 Other pulmonary embolism without acute cor pulmonale: Secondary | ICD-10-CM

## 2012-12-18 DIAGNOSIS — I4891 Unspecified atrial fibrillation: Secondary | ICD-10-CM

## 2012-12-18 DIAGNOSIS — I82409 Acute embolism and thrombosis of unspecified deep veins of unspecified lower extremity: Secondary | ICD-10-CM

## 2013-01-01 ENCOUNTER — Encounter (HOSPITAL_COMMUNITY): Payer: Self-pay | Admitting: Emergency Medicine

## 2013-01-01 ENCOUNTER — Telehealth: Payer: Self-pay | Admitting: Pulmonary Disease

## 2013-01-01 ENCOUNTER — Ambulatory Visit (INDEPENDENT_AMBULATORY_CARE_PROVIDER_SITE_OTHER): Payer: Medicare Other | Admitting: *Deleted

## 2013-01-01 ENCOUNTER — Emergency Department (HOSPITAL_COMMUNITY): Payer: Medicare Other

## 2013-01-01 ENCOUNTER — Inpatient Hospital Stay (HOSPITAL_COMMUNITY)
Admission: EM | Admit: 2013-01-01 | Discharge: 2013-01-09 | DRG: 539 | Disposition: A | Discharge status: Nursing Home (Includes ICF) | Payer: Medicare Other | Attending: Internal Medicine | Admitting: Internal Medicine

## 2013-01-01 DIAGNOSIS — Z01810 Encounter for preprocedural cardiovascular examination: Secondary | ICD-10-CM

## 2013-01-01 DIAGNOSIS — L02419 Cutaneous abscess of limb, unspecified: Secondary | ICD-10-CM | POA: Diagnosis present

## 2013-01-01 DIAGNOSIS — Z7901 Long term (current) use of anticoagulants: Secondary | ICD-10-CM

## 2013-01-01 DIAGNOSIS — L0390 Cellulitis, unspecified: Secondary | ICD-10-CM | POA: Diagnosis present

## 2013-01-01 DIAGNOSIS — I350 Nonrheumatic aortic (valve) stenosis: Secondary | ICD-10-CM

## 2013-01-01 DIAGNOSIS — I82409 Acute embolism and thrombosis of unspecified deep veins of unspecified lower extremity: Secondary | ICD-10-CM

## 2013-01-01 DIAGNOSIS — M869 Osteomyelitis, unspecified: Principal | ICD-10-CM | POA: Diagnosis present

## 2013-01-01 DIAGNOSIS — I872 Venous insufficiency (chronic) (peripheral): Secondary | ICD-10-CM

## 2013-01-01 DIAGNOSIS — I4891 Unspecified atrial fibrillation: Secondary | ICD-10-CM

## 2013-01-01 DIAGNOSIS — I69959 Hemiplegia and hemiparesis following unspecified cerebrovascular disease affecting unspecified side: Secondary | ICD-10-CM

## 2013-01-01 DIAGNOSIS — D126 Benign neoplasm of colon, unspecified: Secondary | ICD-10-CM

## 2013-01-01 DIAGNOSIS — I2699 Other pulmonary embolism without acute cor pulmonale: Secondary | ICD-10-CM

## 2013-01-01 DIAGNOSIS — R609 Edema, unspecified: Secondary | ICD-10-CM | POA: Diagnosis present

## 2013-01-01 DIAGNOSIS — J690 Pneumonitis due to inhalation of food and vomit: Secondary | ICD-10-CM | POA: Diagnosis not present

## 2013-01-01 DIAGNOSIS — L03115 Cellulitis of right lower limb: Secondary | ICD-10-CM

## 2013-01-01 DIAGNOSIS — M1990 Unspecified osteoarthritis, unspecified site: Secondary | ICD-10-CM | POA: Diagnosis present

## 2013-01-01 DIAGNOSIS — I7092 Chronic total occlusion of artery of the extremities: Secondary | ICD-10-CM

## 2013-01-01 DIAGNOSIS — L0291 Cutaneous abscess, unspecified: Secondary | ICD-10-CM | POA: Diagnosis present

## 2013-01-01 DIAGNOSIS — Z7982 Long term (current) use of aspirin: Secondary | ICD-10-CM

## 2013-01-01 DIAGNOSIS — I82401 Acute embolism and thrombosis of unspecified deep veins of right lower extremity: Secondary | ICD-10-CM

## 2013-01-01 DIAGNOSIS — E785 Hyperlipidemia, unspecified: Secondary | ICD-10-CM | POA: Diagnosis present

## 2013-01-01 DIAGNOSIS — Z9889 Other specified postprocedural states: Secondary | ICD-10-CM

## 2013-01-01 DIAGNOSIS — M7989 Other specified soft tissue disorders: Secondary | ICD-10-CM

## 2013-01-01 DIAGNOSIS — I509 Heart failure, unspecified: Secondary | ICD-10-CM | POA: Diagnosis present

## 2013-01-01 DIAGNOSIS — R269 Unspecified abnormalities of gait and mobility: Secondary | ICD-10-CM

## 2013-01-01 DIAGNOSIS — I724 Aneurysm of artery of lower extremity: Secondary | ICD-10-CM | POA: Diagnosis present

## 2013-01-01 DIAGNOSIS — R1312 Dysphagia, oropharyngeal phase: Secondary | ICD-10-CM | POA: Diagnosis present

## 2013-01-01 DIAGNOSIS — Z86711 Personal history of pulmonary embolism: Secondary | ICD-10-CM

## 2013-01-01 DIAGNOSIS — I679 Cerebrovascular disease, unspecified: Secondary | ICD-10-CM

## 2013-01-01 DIAGNOSIS — G609 Hereditary and idiopathic neuropathy, unspecified: Secondary | ICD-10-CM

## 2013-01-01 DIAGNOSIS — Z86718 Personal history of other venous thrombosis and embolism: Secondary | ICD-10-CM

## 2013-01-01 DIAGNOSIS — J9600 Acute respiratory failure, unspecified whether with hypoxia or hypercapnia: Secondary | ICD-10-CM | POA: Diagnosis not present

## 2013-01-01 DIAGNOSIS — I714 Abdominal aortic aneurysm, without rupture: Secondary | ICD-10-CM

## 2013-01-01 DIAGNOSIS — C61 Malignant neoplasm of prostate: Secondary | ICD-10-CM | POA: Diagnosis present

## 2013-01-01 DIAGNOSIS — K589 Irritable bowel syndrome without diarrhea: Secondary | ICD-10-CM | POA: Diagnosis present

## 2013-01-01 DIAGNOSIS — G608 Other hereditary and idiopathic neuropathies: Secondary | ICD-10-CM | POA: Diagnosis present

## 2013-01-01 DIAGNOSIS — I739 Peripheral vascular disease, unspecified: Secondary | ICD-10-CM

## 2013-01-01 DIAGNOSIS — E873 Alkalosis: Secondary | ICD-10-CM | POA: Diagnosis present

## 2013-01-01 DIAGNOSIS — I70299 Other atherosclerosis of native arteries of extremities, unspecified extremity: Secondary | ICD-10-CM | POA: Diagnosis present

## 2013-01-01 DIAGNOSIS — K219 Gastro-esophageal reflux disease without esophagitis: Secondary | ICD-10-CM

## 2013-01-01 DIAGNOSIS — J309 Allergic rhinitis, unspecified: Secondary | ICD-10-CM

## 2013-01-01 DIAGNOSIS — J45909 Unspecified asthma, uncomplicated: Secondary | ICD-10-CM | POA: Diagnosis present

## 2013-01-01 DIAGNOSIS — K5730 Diverticulosis of large intestine without perforation or abscess without bleeding: Secondary | ICD-10-CM

## 2013-01-01 DIAGNOSIS — I6350 Cerebral infarction due to unspecified occlusion or stenosis of unspecified cerebral artery: Secondary | ICD-10-CM

## 2013-01-01 DIAGNOSIS — J209 Acute bronchitis, unspecified: Secondary | ICD-10-CM

## 2013-01-01 DIAGNOSIS — J189 Pneumonia, unspecified organism: Secondary | ICD-10-CM

## 2013-01-01 DIAGNOSIS — J42 Unspecified chronic bronchitis: Secondary | ICD-10-CM

## 2013-01-01 DIAGNOSIS — I10 Essential (primary) hypertension: Secondary | ICD-10-CM | POA: Diagnosis present

## 2013-01-01 DIAGNOSIS — Z8546 Personal history of malignant neoplasm of prostate: Secondary | ICD-10-CM

## 2013-01-01 DIAGNOSIS — I82509 Chronic embolism and thrombosis of unspecified deep veins of unspecified lower extremity: Secondary | ICD-10-CM | POA: Diagnosis present

## 2013-01-01 DIAGNOSIS — IMO0001 Reserved for inherently not codable concepts without codable children: Secondary | ICD-10-CM | POA: Diagnosis present

## 2013-01-01 DIAGNOSIS — F341 Dysthymic disorder: Secondary | ICD-10-CM

## 2013-01-01 HISTORY — DX: Pneumonia, unspecified organism: J18.9

## 2013-01-01 HISTORY — DX: Acute embolism and thrombosis of unspecified deep veins of unspecified lower extremity: I82.409

## 2013-01-01 HISTORY — DX: Reserved for concepts with insufficient information to code with codable children: IMO0002

## 2013-01-01 HISTORY — DX: Shortness of breath: R06.02

## 2013-01-01 HISTORY — DX: Cerebral infarction, unspecified: I63.9

## 2013-01-01 HISTORY — DX: Unspecified osteoarthritis, unspecified site: M19.90

## 2013-01-01 HISTORY — DX: Unspecified malignant neoplasm of skin, unspecified: C44.90

## 2013-01-01 HISTORY — DX: Cardiac murmur, unspecified: R01.1

## 2013-01-01 LAB — CBC WITH DIFFERENTIAL/PLATELET
Basophils Absolute: 0 10*3/uL (ref 0.0–0.1)
Eosinophils Relative: 0 % (ref 0–5)
HCT: 43.7 % (ref 39.0–52.0)
Hemoglobin: 14.4 g/dL (ref 13.0–17.0)
Lymphocytes Relative: 22 % (ref 12–46)
MCHC: 33 g/dL (ref 30.0–36.0)
MCV: 96.3 fL (ref 78.0–100.0)
Monocytes Absolute: 0.6 10*3/uL (ref 0.1–1.0)
Monocytes Relative: 7 % (ref 3–12)
RDW: 13.7 % (ref 11.5–15.5)
WBC: 8.5 10*3/uL (ref 4.0–10.5)

## 2013-01-01 LAB — BASIC METABOLIC PANEL
CO2: 28 mEq/L (ref 19–32)
Calcium: 10.8 mg/dL — ABNORMAL HIGH (ref 8.4–10.5)
GFR calc Af Amer: 59 mL/min — ABNORMAL LOW (ref >90)
GFR calc non Af Amer: 51 mL/min — ABNORMAL LOW (ref >90)
Glucose, Bld: 113 mg/dL — ABNORMAL HIGH (ref 70–99)
Potassium: 4.5 mEq/L (ref 3.5–5.1)
Sodium: 136 mEq/L (ref 135–145)

## 2013-01-01 LAB — PROTIME-INR: INR: 2.04 — ABNORMAL HIGH (ref 0.00–1.49)

## 2013-01-01 MED ORDER — POLYETHYLENE GLYCOL 3350 17 G PO PACK
17.0000 g | PACK | Freq: Every day | ORAL | Status: DC | PRN
  Administered 2013-01-02 – 2013-01-04 (×2): 17 g via ORAL
  Filled 2013-01-01 (×4): qty 1

## 2013-01-01 MED ORDER — SODIUM CHLORIDE 0.9 % IJ SOLN
3.0000 mL | Freq: Two times a day (BID) | INTRAMUSCULAR | Status: DC
  Administered 2013-01-02 – 2013-01-08 (×9): 3 mL via INTRAVENOUS

## 2013-01-01 MED ORDER — MOMETASONE FURO-FORMOTEROL FUM 100-5 MCG/ACT IN AERO
2.0000 | INHALATION_SPRAY | Freq: Two times a day (BID) | RESPIRATORY_TRACT | Status: DC
  Administered 2013-01-02 – 2013-01-09 (×12): 2 via RESPIRATORY_TRACT
  Filled 2013-01-01 (×2): qty 8.8

## 2013-01-01 MED ORDER — WARFARIN - PHARMACIST DOSING INPATIENT
Freq: Every day | Status: DC
  Administered 2013-01-07: 6

## 2013-01-01 MED ORDER — SODIUM CHLORIDE 0.9 % IJ SOLN
3.0000 mL | Freq: Two times a day (BID) | INTRAMUSCULAR | Status: DC
  Administered 2013-01-04 – 2013-01-08 (×5): 3 mL via INTRAVENOUS

## 2013-01-01 MED ORDER — VANCOMYCIN HCL IN DEXTROSE 1-5 GM/200ML-% IV SOLN
1000.0000 mg | Freq: Once | INTRAVENOUS | Status: AC
  Administered 2013-01-01: 1000 mg via INTRAVENOUS
  Filled 2013-01-01: qty 200

## 2013-01-01 MED ORDER — PREDNISONE 5 MG PO TABS
5.0000 mg | ORAL_TABLET | ORAL | Status: DC
  Administered 2013-01-03 – 2013-01-09 (×4): 5 mg via ORAL
  Filled 2013-01-01 (×6): qty 1

## 2013-01-01 MED ORDER — WARFARIN SODIUM 4 MG PO TABS
4.0000 mg | ORAL_TABLET | Freq: Every day | ORAL | Status: DC
  Administered 2013-01-01 – 2013-01-05 (×5): 4 mg via ORAL
  Filled 2013-01-01 (×7): qty 1

## 2013-01-01 MED ORDER — ONDANSETRON HCL 4 MG/2ML IJ SOLN: 4.0000 mg | Freq: Four times a day (QID) | INTRAMUSCULAR | Status: DC | PRN

## 2013-01-01 MED ORDER — FLORA-Q PO CAPS
1.0000 | ORAL_CAPSULE | Freq: Every day | ORAL | Status: DC
  Administered 2013-01-02 – 2013-01-09 (×7): 1 via ORAL
  Filled 2013-01-01 (×8): qty 1

## 2013-01-01 MED ORDER — ACETAMINOPHEN 325 MG PO TABS
650.0000 mg | ORAL_TABLET | Freq: Two times a day (BID) | ORAL | Status: DC
  Administered 2013-01-01 – 2013-01-09 (×15): 650 mg via ORAL
  Filled 2013-01-01 (×20): qty 2

## 2013-01-01 MED ORDER — FLUTICASONE PROPIONATE 50 MCG/ACT NA SUSP
1.0000 | Freq: Every day | NASAL | Status: DC
  Filled 2013-01-01: qty 16

## 2013-01-01 MED ORDER — DEXTROSE 5 % IV SOLN
1.0000 g | Freq: Once | INTRAVENOUS | Status: AC
  Administered 2013-01-01: 1 g via INTRAVENOUS
  Filled 2013-01-01: qty 10

## 2013-01-01 MED ORDER — VANCOMYCIN HCL IN DEXTROSE 750-5 MG/150ML-% IV SOLN
750.0000 mg | Freq: Two times a day (BID) | INTRAVENOUS | Status: DC
  Administered 2013-01-02 – 2013-01-09 (×15): 750 mg via INTRAVENOUS
  Filled 2013-01-01 (×18): qty 150

## 2013-01-01 MED ORDER — CIPROFLOXACIN IN D5W 400 MG/200ML IV SOLN
400.0000 mg | Freq: Two times a day (BID) | INTRAVENOUS | Status: DC
  Filled 2013-01-01: qty 200

## 2013-01-01 MED ORDER — MORPHINE SULFATE 2 MG/ML IJ SOLN
1.0000 mg | INTRAMUSCULAR | Status: DC | PRN
  Administered 2013-01-02 – 2013-01-05 (×5): 1 mg via INTRAVENOUS
  Filled 2013-01-01 (×6): qty 1

## 2013-01-01 MED ORDER — POTASSIUM CHLORIDE CRYS ER 20 MEQ PO TBCR
20.0000 meq | EXTENDED_RELEASE_TABLET | Freq: Every day | ORAL | Status: DC
  Administered 2013-01-02 – 2013-01-05 (×4): 20 meq via ORAL
  Filled 2013-01-01 (×5): qty 1

## 2013-01-01 MED ORDER — VITAMIN D3 25 MCG (1000 UNIT) PO TABS
1000.0000 [IU] | ORAL_TABLET | Freq: Every day | ORAL | Status: DC
  Administered 2013-01-02 – 2013-01-05 (×4): 1000 [IU] via ORAL
  Filled 2013-01-01 (×5): qty 1

## 2013-01-01 MED ORDER — SODIUM CHLORIDE 0.9 % IJ SOLN: 3.0000 mL | INTRAMUSCULAR | Status: DC | PRN

## 2013-01-01 MED ORDER — ATORVASTATIN CALCIUM 20 MG PO TABS
20.0000 mg | ORAL_TABLET | Freq: Every day | ORAL | Status: DC
  Administered 2013-01-01 – 2013-01-05 (×5): 20 mg via ORAL
  Filled 2013-01-01 (×6): qty 1

## 2013-01-01 MED ORDER — SODIUM CHLORIDE 0.9 % IV SOLN
250.0000 mL | INTRAVENOUS | Status: DC | PRN
  Administered 2013-01-01: 250 mL via INTRAVENOUS

## 2013-01-01 MED ORDER — ALBUTEROL SULFATE HFA 108 (90 BASE) MCG/ACT IN AERS
1.0000 | INHALATION_SPRAY | Freq: Four times a day (QID) | RESPIRATORY_TRACT | Status: DC | PRN
  Administered 2013-01-02 (×2): 2 via RESPIRATORY_TRACT
  Filled 2013-01-01: qty 6.7

## 2013-01-01 MED ORDER — ONDANSETRON HCL 4 MG PO TABS
4.0000 mg | ORAL_TABLET | Freq: Four times a day (QID) | ORAL | Status: DC | PRN
  Administered 2013-01-08: 4 mg via ORAL
  Filled 2013-01-01: qty 1

## 2013-01-01 MED ORDER — CIPROFLOXACIN IN D5W 400 MG/200ML IV SOLN
400.0000 mg | INTRAVENOUS | Status: DC
  Administered 2013-01-02 – 2013-01-05 (×4): 400 mg via INTRAVENOUS
  Filled 2013-01-01 (×5): qty 200

## 2013-01-01 MED ORDER — CALCIUM CARBONATE ANTACID 500 MG PO CHEW
1.0000 | CHEWABLE_TABLET | Freq: Every day | ORAL | Status: DC | PRN
  Administered 2013-01-02: 200 mg via ORAL
  Administered 2013-01-03 – 2013-01-05 (×3): 400 mg via ORAL
  Filled 2013-01-01: qty 1
  Filled 2013-01-01: qty 2
  Filled 2013-01-01: qty 1
  Filled 2013-01-01: qty 2
  Filled 2013-01-01: qty 1

## 2013-01-01 MED ORDER — FAMOTIDINE 10 MG PO TABS
10.0000 mg | ORAL_TABLET | Freq: Every day | ORAL | Status: DC
  Administered 2013-01-01 – 2013-01-05 (×5): 10 mg via ORAL
  Filled 2013-01-01 (×8): qty 1

## 2013-01-01 MED ORDER — ASPIRIN EC 81 MG PO TBEC
81.0000 mg | DELAYED_RELEASE_TABLET | Freq: Every day | ORAL | Status: DC
  Administered 2013-01-02 – 2013-01-09 (×7): 81 mg via ORAL
  Filled 2013-01-01 (×9): qty 1

## 2013-01-01 NOTE — ED Notes (Signed)
Pt c/o right little digit pain with redness and swelling to right leg

## 2013-01-01 NOTE — Progress Notes (Signed)
ANTIBIOTIC  / ANTI-COAGULATION CONSULT NOTE - INITIAL  Pharmacy Consult for Vancomycin Indication: cellulitis / DVT history  Allergies  Allergen Reactions  . Doxycycline     Rash  . Amoxicillin Rash    Yeast infection     Patient Measurements: Weight = 78.1 kg  Labs:  Recent Labs  01/01/13 1620  WBC 8.5  HGB 14.4  PLT 160  CREATININE 1.23    Medical History: Past Medical History  Diagnosis Date  . Allergic rhinitis, cause unspecified   . Unspecified chronic bronchitis   . Other pulmonary embolism and infarction   . Paroxysmal a-fib   . Cerebrovascular disease, unspecified 10/2002    right side weakness  . Peripheral vascular disease, unspecified   . Unspecified venous (peripheral) insufficiency   . Acute venous embolism and thrombosis of unspecified deep vessels of lower extremity   . Hyperlipidemia   . GERD (gastroesophageal reflux disease)   . Diverticulosis of colon (without mention of hemorrhage)   . Irritable bowel syndrome   . Benign neoplasm of colon   . Malignant neoplasm of prostate   . Osteoarthrosis, unspecified whether generalized or localized, unspecified site   . Unspecified cerebral artery occlusion with cerebral infarction   . Unspecified hereditary and idiopathic peripheral neuropathy   . Depression   . Hypertension     Dr. Eden Emms cardiologist   . Right bundle branch block   . Asthma   . History of scarlet fever   . Aortic stenosis, mild     mild by echo 04/2011    Assessment: 79 year old male on Coumadin PTA for DVT history admitted with lower extremity pain and a sore on his 5 th toe which began draining today.  Pharmacy asked to dose vancomycin for cellulitis, rule out osteomyelitis.  Admit INR is 2.04 on home dose of 4 mg daily  Goal of Therapy:  Vancomycin trough level 15-20 mcg/ml INR = 2 to 3  Plan:  1) Vancomycin 750 mg iv Q 12 hours 2) Coumadin 4 mg po daily at 1800 pm 3) Daily INR 4) Follow up Scr, cultures, fever curve,  plan  Thank you. Okey Regal, PharmD [phone]  01/01/2013,8:31 PM

## 2013-01-01 NOTE — H&P (Signed)
[facility] History and Physical  Glenn Barron MVH:[id number] DOB: [birth year]    PCP:   Michele Mcalpine, MD   Chief Complaint: right foot infection.  HPI: Glenn Barron is an 79 y.o. male lives at home with round the clock help, hx of PAF, prior PE and DVT on chronic anticoagulation, hx of PVD, mild AS, HTN, chronic lower extremity swelling, presents to the ER with increase swelling, redness, and discharge from his right foot.  He has purulent discharge over the 5th toe along with increase pain.  He has no fever, chills, nausea, vomiting or any acute systemic symptoms.  Evalaution in the ER showed normal WBC, no fever, and normal renal fx.  Hospitalist was asked to admit him for cellulitis.  He said his symptoms started about 3 days ago.  Rewiew of Systems:  Constitutional: Negative for malaise, fever and chills. No significant weight loss or weight gain Eyes: Negative for eye pain, redness and discharge, diplopia, visual changes, or flashes of light. ENMT: Negative for ear pain, hoarseness, nasal congestion, sinus pressure and sore throat. No headaches; tinnitus, drooling, or problem swallowing. Cardiovascular: Negative for chest pain, palpitations, diaphoresis, dyspnea and peripheral edema. ; No orthopnea, PND Respiratory: Negative for cough, hemoptysis, wheezing and stridor. No pleuritic chestpain. Gastrointestinal: Negative for nausea, vomiting, diarrhea, constipation, abdominal pain, melena, blood in stool, hematemesis, jaundice and rectal bleeding.    Genitourinary: Negative for frequency, dysuria, incontinence,flank pain and hematuria; Musculoskeletal: Negative for back pain and neck pain.  Skin: . Negative for pruritus, rash, abrasions, bruising and skin lesion.; ulcerations Neuro: Negative for headache, lightheadedness and neck stiffness. Negative for weakness, altered level of consciousness , altered mental status, extremity weakness, burning feet, involuntary movement,  seizure and syncope.  Psych: negative for anxiety, depression, insomnia, tearfulness, panic attacks, hallucinations, paranoia, suicidal or homicidal ideation    Past Medical History  Diagnosis Date  . Allergic rhinitis, cause unspecified   . Unspecified chronic bronchitis   . Other pulmonary embolism and infarction   . Paroxysmal a-fib   . Cerebrovascular disease, unspecified 10/2002    right side weakness  . Peripheral vascular disease, unspecified   . Unspecified venous (peripheral) insufficiency   . Acute venous embolism and thrombosis of unspecified deep vessels of lower extremity   . Hyperlipidemia   . GERD (gastroesophageal reflux disease)   . Diverticulosis of colon (without mention of hemorrhage)   . Irritable bowel syndrome   . Benign neoplasm of colon   . Malignant neoplasm of prostate   . Osteoarthrosis, unspecified whether generalized or localized, unspecified site   . Unspecified cerebral artery occlusion with cerebral infarction   . Unspecified hereditary and idiopathic peripheral neuropathy   . Depression   . Hypertension     Dr. Eden Emms cardiologist   . Right bundle branch block   . Asthma   . History of scarlet fever   . Aortic stenosis, mild     mild by echo 04/2011    Past Surgical History  Procedure Laterality Date  . Prostateectomy    . Cataract extraction    . Carotid endarterectomy      right  . Abdominal aortic aneurysm repair    . Skin cancer excision    . Inguinal hernia repair  1991    bilateral    Medications:  HOME MEDS: Prior to Admission medications   Medication Sig Start Date End Date Taking? Authorizing Provider  acetaminophen (TYLENOL) 650 MG CR tablet Take 650 mg by mouth 2 (  two) times daily.    Yes Historical Provider, MD  albuterol (PROVENTIL HFA;VENTOLIN HFA) 108 (90 BASE) MCG/ACT inhaler Inhale 1-2 puffs into the lungs every 6 (six) hours as needed for wheezing or shortness of breath.   Yes Historical Provider, MD  aspirin 81 MG  tablet Take 81 mg by mouth daily.     Yes Historical Provider, MD  atorvastatin (LIPITOR) 20 MG tablet take 1 tablet by mouth once daily 10/24/12  Yes Michele Mcalpine, MD  calcium carbonate (TUMS - DOSED IN MG ELEMENTAL CALCIUM) 500 MG chewable tablet Chew 1-2 tablets by mouth daily as needed for indigestion or heartburn. As needed   Yes Historical Provider, MD  cholecalciferol (VITAMIN D) 1000 UNITS tablet Take 1,000 Units by mouth daily.     Yes Historical Provider, MD  famotidine (PEPCID) 10 MG tablet Take 10 mg by mouth at bedtime.     Yes Historical Provider, MD  fluticasone (FLONASE) 50 MCG/ACT nasal spray instill 2 sprays into each nostril once daily 09/19/12  Yes Michele Mcalpine, MD  Fluticasone-Salmeterol (ADVAIR) 250-50 MCG/DOSE AEPB Inhale 1 puff into the lungs 2 (two) times daily. Rinse mouth well after use   Yes Historical Provider, MD  furosemide (LASIX) 40 MG tablet 1 AM 1 PM 03/25/11  Yes Michele Mcalpine, MD  loratadine (CLARITIN) 10 MG tablet Take 10 mg by mouth daily.   Yes Historical Provider, MD  polyethylene glycol (MIRALAX / GLYCOLAX) packet Take 17 g by mouth as needed.   Yes Historical Provider, MD  potassium chloride SA (KLOR-CON M20) 20 MEQ tablet Take 20 mEq by mouth daily.  01/20/12  Yes Michele Mcalpine, MD  predniSONE (DELTASONE) 10 MG tablet Take 5 mg by mouth every other day.  08/02/12  Yes Michele Mcalpine, MD  Probiotic Product (ALIGN PO) Take 1 capsule by mouth daily.   Yes Historical Provider, MD  warfarin (COUMADIN) 4 MG tablet Take 4 mg by mouth daily.   Yes Historical Provider, MD     Allergies:  Allergies  Allergen Reactions  . Doxycycline     Rash  . Amoxicillin Rash    Yeast infection     Social History:   reports that he has never smoked. He has never used smokeless tobacco. He reports that he does not drink alcohol or use illicit drugs.  Family History: Family History  Problem Relation Age of Onset  . Anesthesia problems Neg Hx   . Heart attack Father    . Hyperlipidemia Mother   . Hypertension Mother   . Hypertension Sister      Physical Exam: Filed Vitals:   01/01/13 1845 01/01/13 1900 01/01/13 1930 01/01/13 2037  BP: 150/94 144/104 171/87 160/79  Pulse: 71 91 68 68  Temp:    97.8 F (36.6 C)  TempSrc:    Oral  Resp: 15 17 17 18   Height:    6\' 1"  (1.854 m)  Weight:    74.707 kg (164 lb 11.2 oz)  SpO2: 99% 97% 99% 91%   Blood pressure 160/79, pulse 68, temperature 97.8 F (36.6 C), temperature source Oral, resp. rate 18, height 6\' 1"  (1.854 m), weight 74.707 kg (164 lb 11.2 oz), SpO2 91.00%.  GEN:  Pleasant  patient lying in the stretcher in no acute distress; cooperative with exam. PSYCH:  alert and oriented x4; does not appear anxious or depressed; affect is appropriate. HEENT: Mucous membranes pink and anicteric; PERRLA; EOM intact; no cervical lymphadenopathy nor thyromegaly or carotid  bruit; no JVD; There were no stridor. Neck is very supple. Breasts:: Not examined CHEST WALL: No tenderness CHEST: Normal respiration, clear to auscultation bilaterally.  HEART: Regular rate and rhythm.  There are no murmur, rub, or gallops.   BACK: No kyphosis or scoliosis; no CVA tenderness ABDOMEN: soft and non-tender; no masses, no organomegaly, normal abdominal bowel sounds; no pannus; no intertriginous candida. There is no rebound and no distention. Rectal Exam: Not done EXTREMITIES: No bone or joint deformity; age-appropriate arthropathy of the hands and knees; 2+ edema; no ulcerations.  There is no calf tenderness. Genitalia: not examined PULSES: 2+ and symmetric SKIN: right lower extremitiy with erythema.  There is purulent discharge from the right 5th toe. CNS: Cranial nerves 2-12 grossly intact no focal lateralizing neurologic deficit.  Speech is fluent; uvula elevated with phonation, facial symmetry and tongue midline. DTR are normal bilaterally, cerebella exam is intact, barbinski is negative and strengths are equaled  bilaterally.  No sensory loss.   Labs on Admission:  Basic Metabolic Panel:  Recent Labs Lab 01/01/13 1620  NA 136  K 4.5  CL 98  CO2 28  GLUCOSE 113*  BUN 22  CREATININE 1.23  CALCIUM 10.8*   Liver Function Tests: No results found for this basename: AST, ALT, ALKPHOS, BILITOT, PROT, ALBUMIN,  in the last 168 hours No results found for this basename: LIPASE, AMYLASE,  in the last 168 hours No results found for this basename: AMMONIA,  in the last 168 hours CBC:  Recent Labs Lab 01/01/13 1620  WBC 8.5  NEUTROABS 6.0  HGB 14.4  HCT 43.7  MCV 96.3  PLT 160   Cardiac Enzymes: No results found for this basename: CKTOTAL, CKMB, CKMBINDEX, TROPONINI,  in the last 168 hours  CBG: No results found for this basename: GLUCAP,  in the last 168 hours   Radiological Exams on Admission: Dg Foot Complete Right  01/01/2013   CLINICAL DATA:  Right foot swelling and redness, and increased warmth, sore and pain at little toe  EXAM: RIGHT FOOT COMPLETE - 3+ VIEW  COMPARISON:  10/05/2007  FINDINGS: Marked osseous demineralization.  Diffuse soft tissue swelling.  Extensive small vessel vascular calcification.  Scattered joint space narrowing of interphalangeal joints.  Moderate-sized plantar calcaneal spur.  No acute fracture, dislocation or bone destruction.  Specifically no definite bone destruction is identified at the little toe to suggest osteomyelitis.  IMPRESSION: Marked osseous demineralization with scattered degenerative changes and extensive soft tissue swelling.  No definite acute bony findings.  If there is persistent clinical concern for osteomyelitis involving the right 5th toe, recommend MR imaging, with and without contrast if patient's renal function permits. .   Electronically Signed   By: Ulyses Southward M.D.   On: 01/01/2013 16:43   Assessment/Plan Present on Admission:  . Cellulitis . Aneurysm of artery of lower extremity . Atrial fibrillation . CEREBROVASCULAR DISEASE .  CONGESTIVE HEART FAILURE . Edema . HYPERLIPIDEMIA . HYPERTENSION . PROSTATE CANCER . Cellulitis and abscess  PLAN:  Will admit him for cellulitis of the right foot.  Will give Zenaida Niece and Cipro.  Although he is on Coumadin, will get doppler of both legs to exclude a concomitant DVT.  Will also obtain and MRI with/without contrast to exclude an underlying infection of the bone.  His other meds will be continued.  He is stable, full code, and will be admitted to [facility] service.  Thank you for allowing me to particpate in his care.  Other plans  as per orders.  Code Status: FULL Unk Lightning, MD. [facility] Pager [phone] 7pm to 7am.  01/01/2013, 9:03 PM

## 2013-01-01 NOTE — Telephone Encounter (Signed)
I spoke with Lelon Perla, RN at coumadin clinic and she evaluated the pt foot and she states his foot is very swollen, red, toes are swollen and blue and have white drainage from between several toes. His lower leg on the same foot is also red and swollen as well. I spoke with Dr. Kriste Basque and he advised ER. Pt CMA, Christina advised.Carron Curie, CMA

## 2013-01-01 NOTE — ED Provider Notes (Signed)
CSN: [id number]     Arrival date & time 01/01/13  1541 History   First MD Initiated Contact with Patient 01/01/13 1731     Chief Complaint  Patient presents with  . Foot Pain   (Consider location/radiation/quality/duration/timing/severity/associated sxs/prior Treatment) Patient is a 79 y.o. male presenting with lower extremity pain. The history is provided by the patient. No language interpreter was used.  Foot Pain This is a new problem. The current episode started yesterday. Pertinent negatives include no chest pain, chills, fever or nausea. Associated symptoms comments: Swelling, redness and pain to right lower extremity over the last several days. No fever. There is a sore lateral to 5th toe that began draining today. Pain has been minimal. No calf pain..    Past Medical History  Diagnosis Date  . Allergic rhinitis, cause unspecified   . Unspecified chronic bronchitis   . Other pulmonary embolism and infarction   . Paroxysmal a-fib   . Cerebrovascular disease, unspecified 10/2002    right side weakness  . Peripheral vascular disease, unspecified   . Unspecified venous (peripheral) insufficiency   . Acute venous embolism and thrombosis of unspecified deep vessels of lower extremity   . Hyperlipidemia   . GERD (gastroesophageal reflux disease)   . Diverticulosis of colon (without mention of hemorrhage)   . Irritable bowel syndrome   . Benign neoplasm of colon   . Malignant neoplasm of prostate   . Osteoarthrosis, unspecified whether generalized or localized, unspecified site   . Unspecified cerebral artery occlusion with cerebral infarction   . Unspecified hereditary and idiopathic peripheral neuropathy   . Depression   . Hypertension     Dr. Eden Emms cardiologist   . Right bundle branch block   . Asthma   . History of scarlet fever   . Aortic stenosis, mild     mild by echo 04/2011   Past Surgical History  Procedure Laterality Date  . Prostateectomy    . Cataract  extraction    . Carotid endarterectomy      right  . Abdominal aortic aneurysm repair    . Skin cancer excision    . Inguinal hernia repair  1991    bilateral   Family History  Problem Relation Age of Onset  . Anesthesia problems Neg Hx   . Heart attack Father   . Hyperlipidemia Mother   . Hypertension Mother   . Hypertension Sister    History  Substance Use Topics  . Smoking status: Never Smoker   . Smokeless tobacco: Never Used  . Alcohol Use: No    Review of Systems  Constitutional: Negative for fever and chills.  Respiratory: Negative.  Negative for shortness of breath.   Cardiovascular: Positive for leg swelling. Negative for chest pain.  Gastrointestinal: Negative.  Negative for nausea.  Genitourinary: Negative.  Negative for dysuria.  Musculoskeletal:       See HPI.  Skin: Positive for color change and wound.  Neurological: Negative.   Psychiatric/Behavioral: Negative for confusion.    Allergies  Doxycycline and Amoxicillin  Home Medications   Current Outpatient Rx  Name  Route  Sig  Dispense  Refill  . acetaminophen (TYLENOL) 650 MG CR tablet   Oral   Take 650 mg by mouth 2 (two) times daily.          Marland Kitchen albuterol (PROVENTIL HFA;VENTOLIN HFA) 108 (90 BASE) MCG/ACT inhaler   Inhalation   Inhale 1-2 puffs into the lungs every 6 (six) hours as needed for wheezing  or shortness of breath.         Marland Kitchen aspirin 81 MG tablet   Oral   Take 81 mg by mouth daily.           Marland Kitchen atorvastatin (LIPITOR) 20 MG tablet      take 1 tablet by mouth once daily   30 tablet   11     Refill Approved   . calcium carbonate (TUMS - DOSED IN MG ELEMENTAL CALCIUM) 500 MG chewable tablet   Oral   Chew 1-2 tablets by mouth daily as needed for indigestion or heartburn. As needed         . cholecalciferol (VITAMIN D) 1000 UNITS tablet   Oral   Take 1,000 Units by mouth daily.           . famotidine (PEPCID) 10 MG tablet   Oral   Take 10 mg by mouth at bedtime.            . fluticasone (FLONASE) 50 MCG/ACT nasal spray      instill 2 sprays into each nostril once daily   16 g   11   . Fluticasone-Salmeterol (ADVAIR) 250-50 MCG/DOSE AEPB   Inhalation   Inhale 1 puff into the lungs 2 (two) times daily. Rinse mouth well after use         . furosemide (LASIX) 40 MG tablet      1 AM 1 PM         . loratadine (CLARITIN) 10 MG tablet   Oral   Take 10 mg by mouth daily.         . polyethylene glycol (MIRALAX / GLYCOLAX) packet   Oral   Take 17 g by mouth as needed.         . potassium chloride SA (KLOR-CON M20) 20 MEQ tablet   Oral   Take 20 mEq by mouth daily.          . predniSONE (DELTASONE) 10 MG tablet   Oral   Take 5 mg by mouth every other day.          . Probiotic Product (ALIGN PO)   Oral   Take 1 capsule by mouth daily.         Marland Kitchen warfarin (COUMADIN) 4 MG tablet   Oral   Take 4 mg by mouth daily.          BP 153/88  Pulse 74  Temp(Src) 98 F (36.7 C) (Oral)  Resp 16  SpO2 100% Physical Exam  Constitutional: He is oriented to person, place, and time. He appears well-developed and well-nourished. No distress.  HENT:  Head: Normocephalic and atraumatic.  Eyes: Conjunctivae are normal.  Neck: Normal range of motion.  Cardiovascular: Normal rate.   No murmur heard. Pulmonary/Chest: Effort normal. He has no wheezes. He has no rales.  Abdominal: Soft. There is no tenderness.  Musculoskeletal: Normal range of motion.  Bilateral lower extremity swelling. Right leg is mildly red, becoming significantly red around the 3rd-5th toes. There is purulent material between 4th and 5th digits and a draining sore to lateral foot at the base of the 5th toe. Mild tenderness. There is no redness of the left foot or leg.   Neurological: He is oriented to person, place, and time.  Skin: Skin is warm and dry.  Psychiatric: He has a normal mood and affect.    ED Course  Procedures (including critical care time) Labs  Review Labs Reviewed  BASIC METABOLIC  PANEL - Abnormal; Notable for the following:    Glucose, Bld 113 (*)    Calcium 10.8 (*)    GFR calc non Af Amer 51 (*)    GFR calc Af Amer 59 (*)    All other components within normal limits  PROTIME-INR - Abnormal; Notable for the following:    Prothrombin Time 22.4 (*)    INR 2.04 (*)    All other components within normal limits  CBC WITH DIFFERENTIAL   Results for orders placed during the hospital encounter of 01/01/13  CBC WITH DIFFERENTIAL      Result Value Range   WBC 8.5  4.0 - 10.5 K/uL   RBC 4.54  4.22 - 5.81 MIL/uL   Hemoglobin 14.4  13.0 - 17.0 g/dL   HCT 16.1  09.6 - 04.5 %   MCV 96.3  78.0 - 100.0 fL   MCH 31.7  26.0 - 34.0 pg   MCHC 33.0  30.0 - 36.0 g/dL   RDW 40.9  81.1 - 91.4 %   Platelets 160  150 - 400 K/uL   Neutrophils Relative % 70  43 - 77 %   Neutro Abs 6.0  1.7 - 7.7 K/uL   Lymphocytes Relative 22  12 - 46 %   Lymphs Abs 1.9  0.7 - 4.0 K/uL   Monocytes Relative 7  3 - 12 %   Monocytes Absolute 0.6  0.1 - 1.0 K/uL   Eosinophils Relative 0  0 - 5 %   Eosinophils Absolute 0.0  0.0 - 0.7 K/uL   Basophils Relative 0  0 - 1 %   Basophils Absolute 0.0  0.0 - 0.1 K/uL  BASIC METABOLIC PANEL      Result Value Range   Sodium 136  135 - 145 mEq/L   Potassium 4.5  3.5 - 5.1 mEq/L   Chloride 98  96 - 112 mEq/L   CO2 28  19 - 32 mEq/L   Glucose, Bld 113 (*) 70 - 99 mg/dL   BUN 22  6 - 23 mg/dL   Creatinine, Ser 7.82  0.50 - 1.35 mg/dL   Calcium 95.6 (*) 8.4 - 10.5 mg/dL   GFR calc non Af Amer 51 (*) >90 mL/min   GFR calc Af Amer 59 (*) >90 mL/min  PROTIME-INR      Result Value Range   Prothrombin Time 22.4 (*) 11.6 - 15.2 seconds   INR 2.04 (*) 0.00 - 1.49    Imaging Review Dg Foot Complete Right  01/01/2013   CLINICAL DATA:  Right foot swelling and redness, and increased warmth, sore and pain at little toe  EXAM: RIGHT FOOT COMPLETE - 3+ VIEW  COMPARISON:  10/05/2007  FINDINGS: Marked osseous demineralization.   Diffuse soft tissue swelling.  Extensive small vessel vascular calcification.  Scattered joint space narrowing of interphalangeal joints.  Moderate-sized plantar calcaneal spur.  No acute fracture, dislocation or bone destruction.  Specifically no definite bone destruction is identified at the little toe to suggest osteomyelitis.  IMPRESSION: Marked osseous demineralization with scattered degenerative changes and extensive soft tissue swelling.  No definite acute bony findings.  If there is persistent clinical concern for osteomyelitis involving the right 5th toe, recommend MR imaging, with and without contrast if patient's renal function permits. .   Electronically Signed   By: Ulyses Southward M.D.   On: 01/01/2013 16:43    EKG Interpretation   None       MDM  No diagnosis found.  1. Cellulitis, r/o osteomyelitis 2. Coagulopathy 3. H/o DVT  Plan to admit for treatment of cellulitis and rule out/treat osteomyelitic condition.    Arnoldo Hooker, PA-C 01/01/13 1933

## 2013-01-01 NOTE — Telephone Encounter (Signed)
I called and spoke with pt. He reports last week he hit his right toe getting out of the shower. It bleed a small amount. He is on coumadin. He reports since he has noticed the right leg is jumping at night and it seems to be little swollen-tender. No fever, no chills TP has no openings. Pt will go for coumadin check at 3 PM and is wanting to have someone look at this. Please advise SN thanks  Allergies  Allergen Reactions  . Doxycycline     Rash

## 2013-01-01 NOTE — Telephone Encounter (Signed)
I called and made pt aware. Nothing further needed 

## 2013-01-01 NOTE — Telephone Encounter (Signed)
Per SN--  Ask CC to check this when he goes today at 3.

## 2013-01-02 ENCOUNTER — Inpatient Hospital Stay (HOSPITAL_COMMUNITY): Payer: Medicare Other

## 2013-01-02 DIAGNOSIS — I70209 Unspecified atherosclerosis of native arteries of extremities, unspecified extremity: Secondary | ICD-10-CM

## 2013-01-02 LAB — TSH: TSH: 2.221 u[IU]/mL (ref 0.350–4.500)

## 2013-01-02 LAB — PROTIME-INR: Prothrombin Time: 22.2 seconds — ABNORMAL HIGH (ref 11.6–15.2)

## 2013-01-02 MED ORDER — FUROSEMIDE 40 MG PO TABS
40.0000 mg | ORAL_TABLET | Freq: Two times a day (BID) | ORAL | Status: DC
  Administered 2013-01-02 – 2013-01-05 (×7): 40 mg via ORAL
  Filled 2013-01-02 (×11): qty 1

## 2013-01-02 MED ORDER — GADOBENATE DIMEGLUMINE 529 MG/ML IV SOLN
15.0000 mL | Freq: Once | INTRAVENOUS | Status: AC
  Administered 2013-01-02: 15 mL via INTRAVENOUS

## 2013-01-02 MED ORDER — FLUTICASONE PROPIONATE 50 MCG/ACT NA SUSP
1.0000 | Freq: Two times a day (BID) | NASAL | Status: DC
  Administered 2013-01-02 – 2013-01-09 (×14): 1 via NASAL
  Filled 2013-01-02 (×2): qty 16

## 2013-01-02 NOTE — Progress Notes (Signed)
VASCULAR LAB PRELIMINARY  PRELIMINARY  PRELIMINARY  PRELIMINARY  Bilateral lower extremity venous duplex completed.    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 No evidence of left DVT. Bilateral no evidence of superficial thrombosis or Baker's cyst  Marissia Blackham, RVS 01/02/2013, 7:48 PM

## 2013-01-02 NOTE — Consult Note (Signed)
Vascular Surgery Consultation  Reason for Consult: Cellulitis right fifth toe  HPI: Glenn Barron is a 79 y.o. male who presents for evaluation of cellulitis right fifth toe. Apparently patient traumatized the lateral aspect of his right foot about one week ago. He has been having some slight drainage and redness. He is known to have lower extremity occlusive disease with most recent ABI equal to 0.49 in the right lower extremity. He had previous stent placement for left popliteal aneurysm by Dr. early and was last seen by Dr. early in June of 2014. Patient does ambulate at home with a walker. He denies chills and fever. He is on chronic Coumadin therapy with a history of DVT and pulmonary embolus and has an IVC filter in place   Past Medical History  Diagnosis Date  . Allergic rhinitis, cause unspecified   . Unspecified chronic bronchitis   . Other pulmonary embolism and infarction   . Paroxysmal a-fib   . Cerebrovascular disease, unspecified 10/2002    right side weakness  . Peripheral vascular disease, unspecified   . Unspecified venous (peripheral) insufficiency   . Acute venous embolism and thrombosis of unspecified deep vessels of lower extremity   . Hyperlipidemia   . GERD (gastroesophageal reflux disease)   . Diverticulosis of colon (without mention of hemorrhage)   . Irritable bowel syndrome   . Benign neoplasm of colon   . Unspecified hereditary and idiopathic peripheral neuropathy   . Depression   . Right bundle branch block   . History of scarlet fever 1944  . Aortic stenosis, mild     mild by echo 04/2011  . Heart murmur   . Hypertension     Dr. Eden Emms cardiologist ; "haven't taken anything for a couple years since losing weight" (01/01/2013)  . DVT (deep venous thrombosis)     "put filter in to prevent" (01/01/2013)  . Asthma     "mild" (01/01/2013)  . Pneumonia     "a couple times" (01/01/2013)  . Exertional shortness of breath     "as I get older"  (01/01/2013)  . Unspecified cerebral artery occlusion with cerebral infarction   . Stroke 09/2002    "right foot still drags" (01/01/2013)  . Osteoarthrosis, unspecified whether generalized or localized, unspecified site   . Arthritis     "both ankles" (01/01/2013)  . DDD (degenerative disc disease)   . Malignant neoplasm of prostate   . Skin cancer     "off my face" (01/01/2013)   Past Surgical History  Procedure Laterality Date  . Prostatectomy    . Cataract extraction w/ intraocular lens  implant, bilateral Bilateral   . Carotid endarterectomy Right   . Abdominal aortic aneurysm repair    . Skin cancer excision      "left face and arms" (01/01/2013)  . Tonsillectomy and adenoidectomy    . Inguinal hernia repair Left 1991  . Inguinal hernia repair Right 1930's  . Soft tissue mass excision  1957    "off left arm and back" (01/01/2013)  . Vena cava filter placement Right    History   Social History  . Marital Status: Widowed    Spouse Name: N/A    Number of Children: 3  . Years of Education: N/A   Occupational History  .     Social History Main Topics  . Smoking status: Never Smoker   . Smokeless tobacco: Never Used  . Alcohol Use: Yes     Comment: 01/01/2013 "haven't had  any alcohol since ~ Jul 05, 1997; never had a problem w/alcohol"  . Drug Use: No  . Sexual Activity: No   Other Topics Concern  . None   Social History Narrative   Widow- wife [name] died 2022-07-06   3 children   pt is a chemist--retired   non-smoker   quit drinking 10 years ago   Family History  Problem Relation Age of Onset  . Anesthesia problems Neg Hx   . Heart attack Father   . Hyperlipidemia Mother   . Hypertension Mother   . Hypertension Sister    Allergies  Allergen Reactions  . Doxycycline     Rash  . Amoxicillin Rash    Yeast infection    Prior to Admission medications   Medication Sig Start Date End Date Taking? Authorizing Provider  acetaminophen (TYLENOL) 650 MG CR tablet Take  650 mg by mouth 2 (two) times daily.    Yes Historical Provider, MD  albuterol (PROVENTIL HFA;VENTOLIN HFA) 108 (90 BASE) MCG/ACT inhaler Inhale 1-2 puffs into the lungs every 6 (six) hours as needed for wheezing or shortness of breath.   Yes Historical Provider, MD  aspirin 81 MG tablet Take 81 mg by mouth daily.     Yes Historical Provider, MD  atorvastatin (LIPITOR) 20 MG tablet take 1 tablet by mouth once daily 10/24/12  Yes Michele Mcalpine, MD  calcium carbonate (TUMS - DOSED IN MG ELEMENTAL CALCIUM) 500 MG chewable tablet Chew 1-2 tablets by mouth daily as needed for indigestion or heartburn. As needed   Yes Historical Provider, MD  cholecalciferol (VITAMIN D) 1000 UNITS tablet Take 1,000 Units by mouth daily.     Yes Historical Provider, MD  famotidine (PEPCID) 10 MG tablet Take 10 mg by mouth at bedtime.     Yes Historical Provider, MD  fluticasone (FLONASE) 50 MCG/ACT nasal spray instill 2 sprays into each nostril once daily 09/19/12  Yes Michele Mcalpine, MD  Fluticasone-Salmeterol (ADVAIR) 250-50 MCG/DOSE AEPB Inhale 1 puff into the lungs 2 (two) times daily. Rinse mouth well after use   Yes Historical Provider, MD  furosemide (LASIX) 40 MG tablet 1 AM 1 PM 03/25/11  Yes Michele Mcalpine, MD  loratadine (CLARITIN) 10 MG tablet Take 10 mg by mouth daily.   Yes Historical Provider, MD  polyethylene glycol (MIRALAX / GLYCOLAX) packet Take 17 g by mouth as needed.   Yes Historical Provider, MD  potassium chloride SA (KLOR-CON M20) 20 MEQ tablet Take 20 mEq by mouth daily.  01/20/12  Yes Michele Mcalpine, MD  predniSONE (DELTASONE) 10 MG tablet Take 5 mg by mouth every other day.  08/02/12  Yes Michele Mcalpine, MD  Probiotic Product (ALIGN PO) Take 1 capsule by mouth daily.   Yes Historical Provider, MD  warfarin (COUMADIN) 4 MG tablet Take 4 mg by mouth daily.   Yes Historical Provider, MD      All other systems have been reviewed and were otherwise negative with the exception of those mentioned in the HPI  and as above.  Physical Exam: Filed Vitals:   01/02/13 1430  BP: 132/73  Pulse: 77  Temp: 97.6 F (36.4 C)  Resp: 18    General: Alert, no acute distress HEENT: Normal for age Cardiovascular: Regular rate and rhythm. Carotid pulses 2+, no bruits audible Respiratory: Clear to auscultation. No cyanosis, no use of accessory musculature GI: No organomegaly, abdomen is soft and non-tender Skin: No lesions in the area of chief complaint Neurologic:  Sensation intact distally Psychiatric: Patient is competent for consent with normal mood and affect Musculoskeletal: No obvious deformities Extremities: Right leg with 3+ femoral and 1-2+ popliteal pulse palpable. There is erythema in the distal lateral foot with some slight serous drainage from the lateral aspect right fifth toe. No crepitance noted. No fluctuance noted. Left leg with 3+ femoral and 2+ popliteal pulse palpable left foot well-perfused     Assessment/Plan:  79 year old male with known aneurysmal disease and occlusive disease with recent ABI of 0.49 right lower extremity now with cellulitis right fifth toe  Right fifth toe amputation has significant chance of nonhealing with borderline ABI. May be best to treat this with IV antibiotics and observation sent for deltoid the patient may lead to right BKA. Dr. Arbie Cookey  to followup in a.m.   Josephina Gip, MD 01/02/2013 4:08 PM

## 2013-01-02 NOTE — Progress Notes (Signed)
[facility] PROGRESS NOTE  Glenn Barron ZOX:[id number] DOB: 09/16/1922 DOA: 01/01/2013 PCP: Michele Mcalpine, MD  Assessment/Plan: 1. 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  - MRI Showed OM of the small toe.  - pain control .  - venous duplex pending.   2. Cellulitis and myositis of the RLE: - IV antibiotics on b oard.  - elevate leg.   3. Atrial fibrillation: - rate controlled.  - on chronic anticoagulation with therapeutic INR.   4. Hypertension:  Better controlled.   DVT prophylaxis.   Code Status: full code Family Communication: none at bedside, discussed the plan of care with pt, Disposition Plan: pending PT eval.    Consultants:  Orthopedics   Procedures:  MRI of the RIGHT leg  Venous duplex of the Lower extremity pending.   Antibiotics:  Vancomycin 11/25  Rocephin 11 25 one dose  Ciprofloxacin 11/26  HPI/Subjective: Pain improved. comfortable  Objective: Filed Vitals:   01/02/13 0547  BP: 107/71  Pulse: 80  Temp: 97.6 F (36.4 C)  Resp: 18    Intake/Output Summary (Last 24 hours) at 01/02/13 1149 Last data filed at 01/02/13 0615  Gross per 24 hour  Intake  81.67 ml  Output    100 ml  Net -18.33 ml   Filed Weights   01/01/13 2037  Weight: 74.707 kg (164 lb 11.2 oz)    Exam:   General:  Alert afebrile comfortable  Cardiovascular: s1s2  Respiratory: ctab  Abdomen: soft NT ND BS+  Musculoskeletal: right LE swollen, red and tender. Cellulitic changes.   Data Reviewed: Basic Metabolic Panel:  Recent Labs Lab 01/01/13 1620  NA 136  K 4.5  CL 98  CO2 28  GLUCOSE 113*  BUN 22  CREATININE 1.23  CALCIUM 10.8*   Liver Function Tests: No results found for this basename: AST, ALT, ALKPHOS, BILITOT, PROT, ALBUMIN,  in the last 168 hours No results found for this basename: LIPASE, AMYLASE,  in the last 168 hours No results  found for this basename: AMMONIA,  in the last 168 hours CBC:  Recent Labs Lab 01/01/13 1620  WBC 8.5  NEUTROABS 6.0  HGB 14.4  HCT 43.7  MCV 96.3  PLT 160   Cardiac Enzymes: No results found for this basename: CKTOTAL, CKMB, CKMBINDEX, TROPONINI,  in the last 168 hours BNP (last 3 results) No results found for this basename: PROBNP,  in the last 8760 hours CBG: No results found for this basename: GLUCAP,  in the last 168 hours  Recent Results (from the past 240 hour(s))  MRSA PCR SCREENING     Status: None   Collection Time    01/01/13 10:35 PM      Result Value Range Status   MRSA by PCR NEGATIVE  NEGATIVE Final   Comment:            The GeneXpert MRSA Assay (FDA     approved for NASAL specimens     only), is one component of a     comprehensive MRSA colonization     surveillance program. It is not     intended to diagnose MRSA     infection nor to guide or     monitor treatment for     MRSA infections.     Studies: Mr Foot Right W Wo Contrast  01/02/2013   CLINICAL DATA:  Cellulitis.  Possible osteomyelitis.  EXAM: MRI OF THE RIGHT FOREFOOT WITHOUT AND WITH CONTRAST  TECHNIQUE: Multiplanar, multisequence MR imaging was performed both before and after administration of intravenous contrast.  CONTRAST:  15mL MULTIHANCE GADOBENATE DIMEGLUMINE 529 MG/ML IV SOLN  COMPARISON:  01/01/2013 ; 10/09/2007  FINDINGS: The middle and distal phalanges of the small toe are fused. There is osteomyelitis involving both the proximal phalanx and the fused middle/ distal phalanges of the small toe, with associated abnormal edema and enhancement in the marrow. Surrounding cellulitis in the 5th toe appears to extend into the 4th toe and into the dorsum of the foot. There is subcutaneous edema and low-grade enhancement diffusely in the soft tissues including the plantar musculature.  Lisfranc joint alignment normal. No other regions of osteomyelitis observed.  IMPRESSION: 1. Osteomyelitis of the  phalanges of the small toe. Suspected diffuse forefoot cellulitis most concentrated in the small toe. Potential myositis along the plantar musculature of the forefoot. No drainable abscess.   Electronically Signed   By: Herbie Baltimore M.D.   On: 01/02/2013 11:01   Dg Foot Complete Right  01/01/2013   CLINICAL DATA:  Right foot swelling and redness, and increased warmth, sore and pain at little toe  EXAM: RIGHT FOOT COMPLETE - 3+ VIEW  COMPARISON:  10/05/2007  FINDINGS: Marked osseous demineralization.  Diffuse soft tissue swelling.  Extensive small vessel vascular calcification.  Scattered joint space narrowing of interphalangeal joints.  Moderate-sized plantar calcaneal spur.  No acute fracture, dislocation or bone destruction.  Specifically no definite bone destruction is identified at the little toe to suggest osteomyelitis.  IMPRESSION: Marked osseous demineralization with scattered degenerative changes and extensive soft tissue swelling.  No definite acute bony findings.  If there is persistent clinical concern for osteomyelitis involving the right 5th toe, recommend MR imaging, with and without contrast if patient's renal function permits. .   Electronically Signed   By: Ulyses Southward M.D.   On: 01/01/2013 16:43    Scheduled Meds: . acetaminophen  650 mg Oral BID  . aspirin EC  81 mg Oral Daily  . atorvastatin  20 mg Oral q1800  . cholecalciferol  1,000 Units Oral Daily  . ciprofloxacin  400 mg Intravenous Q24H  . famotidine  10 mg Oral QHS  . FLORA-Q  1 capsule Oral Daily  . fluticasone  1 spray Each Nare BID  . mometasone-formoterol  2 puff Inhalation BID  . potassium chloride SA  20 mEq Oral Daily  . [START ON 01/03/2013] predniSONE  5 mg Oral Q48H  . sodium chloride  3 mL Intravenous Q12H  . sodium chloride  3 mL Intravenous Q12H  . vancomycin  750 mg Intravenous Q12H  . warfarin  4 mg Oral q1800  . Warfarin - Pharmacist Dosing Inpatient   Does not apply q1800   Continuous  Infusions:   Principal Problem:   Cellulitis Active Problems:   PROSTATE CANCER   HYPERLIPIDEMIA   HYPERTENSION   CONGESTIVE HEART FAILURE   CEREBROVASCULAR DISEASE   Edema   Aneurysm of artery of lower extremity   Atrial fibrillation   Cellulitis and abscess    Time spent: 30 min    Joel Cowin  [facility] Pager [phone]. If 7PM-7AM, please contact night-coverage at [email or web address], password [facility] 01/02/2013, 11:49 AM  LOS: 1 day

## 2013-01-02 NOTE — Consult Note (Signed)
Reason for Consult:79 yo male infected 5th toe Referring Physician: hospitalists  Glenn Barron is an 79 y.o. male.  HPI: 79 yo male with poor circulation with diffuse aneurysmal disease who hit toe about 2 weeks ago and has drainage and open wound over 5th MTP jt.  We are consulted for management.  Pt has had previous staph infections treated with ABx.  Past Medical History  Diagnosis Date  . Allergic rhinitis, cause unspecified   . Unspecified chronic bronchitis   . Other pulmonary embolism and infarction   . Paroxysmal a-fib   . Cerebrovascular disease, unspecified 10/2002    right side weakness  . Peripheral vascular disease, unspecified   . Unspecified venous (peripheral) insufficiency   . Acute venous embolism and thrombosis of unspecified deep vessels of lower extremity   . Hyperlipidemia   . GERD (gastroesophageal reflux disease)   . Diverticulosis of colon (without mention of hemorrhage)   . Irritable bowel syndrome   . Benign neoplasm of colon   . Unspecified hereditary and idiopathic peripheral neuropathy   . Depression   . Right bundle branch block   . History of scarlet fever 1944  . Aortic stenosis, mild     mild by echo 04/2011  . Heart murmur   . Hypertension     Dr. Eden Emms cardiologist ; "haven't taken anything for a couple years since losing weight" (01/01/2013)  . DVT (deep venous thrombosis)     "put filter in to prevent" (01/01/2013)  . Asthma     "mild" (01/01/2013)  . Pneumonia     "a couple times" (01/01/2013)  . Exertional shortness of breath     "as I get older" (01/01/2013)  . Unspecified cerebral artery occlusion with cerebral infarction   . Stroke 09/2002    "right foot still drags" (01/01/2013)  . Osteoarthrosis, unspecified whether generalized or localized, unspecified site   . Arthritis     "both ankles" (01/01/2013)  . DDD (degenerative disc disease)   . Malignant neoplasm of prostate   . Skin cancer     "off my face" (01/01/2013)     Past Surgical History  Procedure Laterality Date  . Prostatectomy    . Cataract extraction w/ intraocular lens  implant, bilateral Bilateral   . Carotid endarterectomy Right   . Abdominal aortic aneurysm repair    . Skin cancer excision      "left face and arms" (01/01/2013)  . Tonsillectomy and adenoidectomy    . Inguinal hernia repair Left 1991  . Inguinal hernia repair Right 1930's  . Soft tissue mass excision  1957    "off left arm and back" (01/01/2013)  . Vena cava filter placement Right     Family History  Problem Relation Age of Onset  . Anesthesia problems Neg Hx   . Heart attack Father   . Hyperlipidemia Mother   . Hypertension Mother   . Hypertension Sister     Social History:  reports that he has never smoked. He has never used smokeless tobacco. He reports that he drinks alcohol. He reports that he does not use illicit drugs.  Allergies:  Allergies  Allergen Reactions  . Doxycycline     Rash  . Amoxicillin Rash    Yeast infection     Medications: I have reviewed the patient's current medications.  Results for orders placed during the hospital encounter of 01/01/13 (from the past 48 hour(s))  CBC WITH DIFFERENTIAL     Status: None   Collection  Time    01/01/13  4:20 PM      Result Value Range   WBC 8.5  4.0 - 10.5 K/uL   RBC 4.54  4.22 - 5.81 MIL/uL   Hemoglobin 14.4  13.0 - 17.0 g/dL   HCT 40.9  81.1 - 91.4 %   MCV 96.3  78.0 - 100.0 fL   MCH 31.7  26.0 - 34.0 pg   MCHC 33.0  30.0 - 36.0 g/dL   RDW 78.2  95.6 - 21.3 %   Platelets 160  150 - 400 K/uL   Neutrophils Relative % 70  43 - 77 %   Neutro Abs 6.0  1.7 - 7.7 K/uL   Lymphocytes Relative 22  12 - 46 %   Lymphs Abs 1.9  0.7 - 4.0 K/uL   Monocytes Relative 7  3 - 12 %   Monocytes Absolute 0.6  0.1 - 1.0 K/uL   Eosinophils Relative 0  0 - 5 %   Eosinophils Absolute 0.0  0.0 - 0.7 K/uL   Basophils Relative 0  0 - 1 %   Basophils Absolute 0.0  0.0 - 0.1 K/uL  BASIC METABOLIC PANEL      Status: Abnormal   Collection Time    01/01/13  4:20 PM      Result Value Range   Sodium 136  135 - 145 mEq/L   Potassium 4.5  3.5 - 5.1 mEq/L   Chloride 98  96 - 112 mEq/L   CO2 28  19 - 32 mEq/L   Glucose, Bld 113 (*) 70 - 99 mg/dL   BUN 22  6 - 23 mg/dL   Creatinine, Ser 0.86  0.50 - 1.35 mg/dL   Calcium 57.8 (*) 8.4 - 10.5 mg/dL   GFR calc non Af Amer 51 (*) >90 mL/min   GFR calc Af Amer 59 (*) >90 mL/min   Comment: (NOTE)     The eGFR has been calculated using the CKD EPI equation.     This calculation has not been validated in all clinical situations.     eGFR's persistently <90 mL/min signify possible Chronic Kidney     Disease.  PROTIME-INR     Status: Abnormal   Collection Time    01/01/13  4:20 PM      Result Value Range   Prothrombin Time 22.4 (*) 11.6 - 15.2 seconds   INR 2.04 (*) 0.00 - 1.49  MRSA PCR SCREENING     Status: None   Collection Time    01/01/13 10:35 PM      Result Value Range   MRSA by PCR NEGATIVE  NEGATIVE   Comment:            The GeneXpert MRSA Assay (FDA     approved for NASAL specimens     only), is one component of a     comprehensive MRSA colonization     surveillance program. It is not     intended to diagnose MRSA     infection nor to guide or     monitor treatment for     MRSA infections.  PROTIME-INR     Status: Abnormal   Collection Time    01/02/13  5:58 AM      Result Value Range   Prothrombin Time 22.2 (*) 11.6 - 15.2 seconds   INR 2.02 (*) 0.00 - 1.49  TSH     Status: None   Collection Time    01/02/13  5:58 AM  Result Value Range   TSH 2.221  0.350 - 4.500 uIU/mL   Comment: Performed at [facility]    Mr Foot Right W Wo Contrast  01/02/2013   CLINICAL DATA:  Cellulitis.  Possible osteomyelitis.  EXAM: MRI OF THE RIGHT FOREFOOT WITHOUT AND WITH CONTRAST  TECHNIQUE: Multiplanar, multisequence MR imaging was performed both before and after administration of intravenous contrast.  CONTRAST:  15mL MULTIHANCE  GADOBENATE DIMEGLUMINE 529 MG/ML IV SOLN  COMPARISON:  01/01/2013 ; 10/09/2007  FINDINGS: The middle and distal phalanges of the small toe are fused. There is osteomyelitis involving both the proximal phalanx and the fused middle/ distal phalanges of the small toe, with associated abnormal edema and enhancement in the marrow. Surrounding cellulitis in the 5th toe appears to extend into the 4th toe and into the dorsum of the foot. There is subcutaneous edema and low-grade enhancement diffusely in the soft tissues including the plantar musculature.  Lisfranc joint alignment normal. No other regions of osteomyelitis observed.  IMPRESSION: 1. Osteomyelitis of the phalanges of the small toe. Suspected diffuse forefoot cellulitis most concentrated in the small toe. Potential myositis along the plantar musculature of the forefoot. No drainable abscess.   Electronically Signed   By: Herbie Baltimore M.D.   On: 01/02/2013 11:01   Dg Foot Complete Right  01/01/2013   CLINICAL DATA:  Right foot swelling and redness, and increased warmth, sore and pain at little toe  EXAM: RIGHT FOOT COMPLETE - 3+ VIEW  COMPARISON:  10/05/2007  FINDINGS: Marked osseous demineralization.  Diffuse soft tissue swelling.  Extensive small vessel vascular calcification.  Scattered joint space narrowing of interphalangeal joints.  Moderate-sized plantar calcaneal spur.  No acute fracture, dislocation or bone destruction.  Specifically no definite bone destruction is identified at the little toe to suggest osteomyelitis.  IMPRESSION: Marked osseous demineralization with scattered degenerative changes and extensive soft tissue swelling.  No definite acute bony findings.  If there is persistent clinical concern for osteomyelitis involving the right 5th toe, recommend MR imaging, with and without contrast if patient's renal function permits. .   Electronically Signed   By: Ulyses Southward M.D.   On: 01/01/2013 16:43    ROS ROS: I have reviewed the  patient's review of systems thoroughly and there are no positive responses as relates to the HPI. EXAM: Blood pressure 107/71, pulse 80, temperature 97.6 F (36.4 C), temperature source Oral, resp. rate 18, height 6\' 1"  (1.854 m), weight 74.707 kg (164 lb 11.2 oz), SpO2 97.00%. Physical Exam Well-developed well-nourished patient in no acute distress. Alert and oriented x3 HEENT:within normal limits Cardiac: Regular rate and rhythm Pulmonary: Lungs clear to auscultation Abdomen: Soft and nontender.  Normal active bowel sounds  Musculoskeletal: palp pulsatile mass post popliteal space r leg.  Open draining wound over 5th MTP joint r foot Assessment/Plan: 79 yo male with open wound over 5th mtp jt r foot with known aneurysmal disease and poor circulation r. Side ABI--@0 .47 in June.//  Had a long discussion with patient about RX options.  He could leave it and try IV abx for 6-8 weeks. He could have amputation and accept risk of poor wound healing.  I will talk to his med service and get him cleared by his cardiologist and will discuss case with vascular to make sure there is nothing they would do differently with his diffuse aneurysmal disease.  Will put on schedule for Friday as tentative only.  Will follow in hospital  Nadege Carriger L 01/02/2013,  2:29 PM

## 2013-01-02 NOTE — ED Provider Notes (Signed)
History/physical exam/procedure(s) were performed by non-physician practitioner and as supervising physician I was immediately available for consultation/collaboration. I have reviewed all notes and am in agreement with care and plan.   Hilario Quarry, MD 01/02/13 [phone]

## 2013-01-02 NOTE — Progress Notes (Signed)
PT Cancellation Note  Patient Details Name: Glenn Barron MRN: [id number] DOB: [birth year]   Cancelled Treatment:    Reason Eval/Treat Not Completed: Patient at procedure or test/unavailable (Dopplers per RN)  Will follow up for PT eval 11/28   [facility] 01/02/2013, 3:32 PM

## 2013-01-03 ENCOUNTER — Encounter (HOSPITAL_COMMUNITY): Payer: Self-pay | Admitting: Radiology

## 2013-01-03 ENCOUNTER — Inpatient Hospital Stay (HOSPITAL_COMMUNITY): Payer: Medicare Other

## 2013-01-03 DIAGNOSIS — M7989 Other specified soft tissue disorders: Secondary | ICD-10-CM

## 2013-01-03 LAB — BASIC METABOLIC PANEL
BUN: 23 mg/dL (ref 6–23)
CO2: 26 mEq/L (ref 19–32)
Calcium: 9 mg/dL (ref 8.4–10.5)
Creatinine, Ser: 1.07 mg/dL (ref 0.50–1.35)
GFR calc Af Amer: 69 mL/min — ABNORMAL LOW (ref >90)
GFR calc non Af Amer: 60 mL/min — ABNORMAL LOW (ref >90)
Sodium: 138 mEq/L (ref 135–145)

## 2013-01-03 LAB — PROTIME-INR
INR: 2.32 — ABNORMAL HIGH (ref 0.00–1.49)
Prothrombin Time: 24.7 seconds — ABNORMAL HIGH (ref 11.6–15.2)

## 2013-01-03 MED ORDER — OXYCODONE HCL 5 MG PO TABS
5.0000 mg | ORAL_TABLET | Freq: Four times a day (QID) | ORAL | Status: DC | PRN
  Administered 2013-01-03 – 2013-01-09 (×7): 5 mg via ORAL
  Filled 2013-01-03 (×7): qty 1

## 2013-01-03 MED ORDER — IOHEXOL 350 MG/ML SOLN
100.0000 mL | Freq: Once | INTRAVENOUS | Status: AC | PRN
  Administered 2013-01-03: 100 mL via INTRAVENOUS

## 2013-01-03 NOTE — Progress Notes (Signed)
Patient ID: Glenn Barron, male   DOB: 1922/08/07, 79 y.o.   MRN: [id number] Comfortable. I had a very long discussion with the patient. Very complex issues regarding his right foot. I reviewed his films and MRI suggesting possible osteomyelitis in the fifth digit.  Physical exam standpoint there is no tenderness and no erythema in this whole area appears to be healing well. All status reveals a right femoral pulse and no popliteal or distal pulse. He does have a popliteal pulse on the left.  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  Last ankle arm index review of the ankle arm index of 0.47 on the right. Repeat ankle arm index is pending.  Impression and plan: Clinically improved with antibiotics regarding his right foot. Minimal tenderness and erythema currently. Feel that he is extremely high risk for a nonhealing and there was crepitation. Since he is having no tenderness I certainly would recommend conservative treatment with prolonged antibiotics. I will obtain a CT angiogram of his abdomen pelvis and runoff to determine his tibial runoff on the right. It would be at significant risk for surgical bypass. Continue to follow

## 2013-01-03 NOTE — Progress Notes (Signed)
[facility] PROGRESS NOTE  Glenn Barron ZOX:[id number] DOB: [birth year] DOA: 01/01/2013 PCP: Michele Mcalpine, MD  Assessment/Plan: 1. 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  - MRI Showed OM of the small toe.  - pain control .  - venous duplex showed chronic DVT. - vascular consulted. .   2. Cellulitis and myositis of the RLE: - IV antibiotics on b oard.  - elevate leg.   3. Atrial fibrillation: - rate controlled.  - on chronic anticoagulation with therapeutic INR.   4. Hypertension:  Better controlled.   DVT prophylaxis.   Code Status: full code Family Communication: none at bedside, discussed the plan of care with pt, Disposition Plan: pending PT eval.    Consultants:  Orthopedics   Procedures:  MRI of the RIGHT leg  Venous duplex of the Lower extremity pending.   Antibiotics:  Vancomycin 11/25  Rocephin 11 25 one dose  Ciprofloxacin 11/26  HPI/Subjective: Pain improved. comfortable  Objective: Filed Vitals:   01/03/13 0614  BP: 121/77  Pulse: 65  Temp: 97.4 F (36.3 C)  Resp: 17    Intake/Output Summary (Last 24 hours) at 01/03/13 1540 Last data filed at 01/03/13 0615  Gross per 24 hour  Intake      0 ml  Output   1225 ml  Net  -1225 ml   Filed Weights   01/01/13 2037  Weight: 74.707 kg (164 lb 11.2 oz)    Exam:   General:  Alert afebrile comfortable  Cardiovascular: s1s2  Respiratory: ctab  Abdomen: soft NT ND BS+  Musculoskeletal: right LE swollen, red and tender. Cellulitic changes.   Data Reviewed: Basic Metabolic Panel:  Recent Labs Lab 01/01/13 1620 01/03/13 0355  NA 136 138  K 4.5 3.9  CL 98 105  CO2 28 26  GLUCOSE 113* 93  BUN 22 23  CREATININE 1.23 1.07  CALCIUM 10.8* 9.0   Liver Function Tests: No results found for this basename: AST, ALT, ALKPHOS, BILITOT, PROT, ALBUMIN,  in the last 168 hours No  results found for this basename: LIPASE, AMYLASE,  in the last 168 hours No results found for this basename: AMMONIA,  in the last 168 hours CBC:  Recent Labs Lab 01/01/13 1620  WBC 8.5  NEUTROABS 6.0  HGB 14.4  HCT 43.7  MCV 96.3  PLT 160   Cardiac Enzymes: No results found for this basename: CKTOTAL, CKMB, CKMBINDEX, TROPONINI,  in the last 168 hours BNP (last 3 results) No results found for this basename: PROBNP,  in the last 8760 hours CBG: No results found for this basename: GLUCAP,  in the last 168 hours  Recent Results (from the past 240 hour(s))  MRSA PCR SCREENING     Status: None   Collection Time    01/01/13 10:35 PM      Result Value Range Status   MRSA by PCR NEGATIVE  NEGATIVE Final   Comment:            The GeneXpert MRSA Assay (FDA     approved for NASAL specimens     only), is one component of a     comprehensive MRSA colonization     surveillance program. It is not     intended to diagnose MRSA     infection nor to guide or     monitor treatment for     MRSA infections.  WOUND  CULTURE     Status: None   Collection Time    01/02/13  2:42 PM      Result Value Range Status   Specimen Description WOUND RIGHT TOE   Final   Special Requests RT BABY TOE   Final   Gram Stain     Final   Value: RARE WBC PRESENT, PREDOMINANTLY PMN     NO SQUAMOUS EPITHELIAL CELLS SEEN     NO ORGANISMS SEEN     Performed at [facility]   Culture     Final   Value: NO GROWTH 1 DAY     Performed at [facility]   Report Status PENDING   Incomplete     Studies: Mr Foot Right W Wo Contrast  01/02/2013   CLINICAL DATA:  Cellulitis.  Possible osteomyelitis.  EXAM: MRI OF THE RIGHT FOREFOOT WITHOUT AND WITH CONTRAST  TECHNIQUE: Multiplanar, multisequence MR imaging was performed both before and after administration of intravenous contrast.  CONTRAST:  15mL MULTIHANCE GADOBENATE DIMEGLUMINE 529 MG/ML IV SOLN  COMPARISON:  01/01/2013 ; 10/09/2007  FINDINGS: The  middle and distal phalanges of the small toe are fused. There is osteomyelitis involving both the proximal phalanx and the fused middle/ distal phalanges of the small toe, with associated abnormal edema and enhancement in the marrow. Surrounding cellulitis in the 5th toe appears to extend into the 4th toe and into the dorsum of the foot. There is subcutaneous edema and low-grade enhancement diffusely in the soft tissues including the plantar musculature.  Lisfranc joint alignment normal. No other regions of osteomyelitis observed.  IMPRESSION: 1. Osteomyelitis of the phalanges of the small toe. Suspected diffuse forefoot cellulitis most concentrated in the small toe. Potential myositis along the plantar musculature of the forefoot. No drainable abscess.   Electronically Signed   By: Herbie Baltimore M.D.   On: 01/02/2013 11:01   Dg Foot Complete Right  01/01/2013   CLINICAL DATA:  Right foot swelling and redness, and increased warmth, sore and pain at little toe  EXAM: RIGHT FOOT COMPLETE - 3+ VIEW  COMPARISON:  10/05/2007  FINDINGS: Marked osseous demineralization.  Diffuse soft tissue swelling.  Extensive small vessel vascular calcification.  Scattered joint space narrowing of interphalangeal joints.  Moderate-sized plantar calcaneal spur.  No acute fracture, dislocation or bone destruction.  Specifically no definite bone destruction is identified at the little toe to suggest osteomyelitis.  IMPRESSION: Marked osseous demineralization with scattered degenerative changes and extensive soft tissue swelling.  No definite acute bony findings.  If there is persistent clinical concern for osteomyelitis involving the right 5th toe, recommend MR imaging, with and without contrast if patient's renal function permits. .   Electronically Signed   By: Ulyses Southward M.D.   On: 01/01/2013 16:43    Scheduled Meds: . acetaminophen  650 mg Oral BID  . aspirin EC  81 mg Oral Daily  . atorvastatin  20 mg Oral q1800  .  cholecalciferol  1,000 Units Oral Daily  . ciprofloxacin  400 mg Intravenous Q24H  . famotidine  10 mg Oral QHS  . FLORA-Q  1 capsule Oral Daily  . fluticasone  1 spray Each Nare BID  . furosemide  40 mg Oral BID  . mometasone-formoterol  2 puff Inhalation BID  . potassium chloride SA  20 mEq Oral Daily  . predniSONE  5 mg Oral Q48H  . sodium chloride  3 mL Intravenous Q12H  . sodium chloride  3 mL Intravenous  Q12H  . vancomycin  750 mg Intravenous Q12H  . warfarin  4 mg Oral q1800  . Warfarin - Pharmacist Dosing Inpatient   Does not apply q1800   Continuous Infusions:   Principal Problem:   Cellulitis Active Problems:   PROSTATE CANCER   HYPERLIPIDEMIA   HYPERTENSION   CONGESTIVE HEART FAILURE   CEREBROVASCULAR DISEASE   Edema   Aneurysm of artery of lower extremity   Atrial fibrillation   Cellulitis and abscess    Time spent: 30 min    Braden Deloach  [facility] Pager [phone]. If 7PM-7AM, please contact night-coverage at [email or web address], password [facility] 01/03/2013, 3:40 PM  LOS: 2 days

## 2013-01-03 NOTE — Progress Notes (Signed)
ANTICOAGULATION CONSULT NOTE - Follow Up Consult  Pharmacy Consult for Coumadin Indication: h/o DVT and PE, afib  Allergies  Allergen Reactions  . Doxycycline     Rash  . Amoxicillin Rash    Yeast infection     Patient Measurements: Height: 6\' 1"  (185.4 cm) Weight: 164 lb 11.2 oz (74.707 kg) IBW/kg (Calculated) : 79.9  Vital Signs: Temp: 97.4 F (36.3 C) (11/27 0614) BP: 121/77 mmHg (11/27 0614) Pulse Rate: 65 (11/27 0614)  Labs:  Recent Labs  01/01/13 1620 01/02/13 0558 01/03/13 0355  HGB 14.4  --   --   HCT 43.7  --   --   PLT 160  --   --   LABPROT 22.4* 22.2* 24.7*  INR 2.04* 2.02* 2.32*  CREATININE 1.23  --  1.07    Estimated Creatinine Clearance: 50.4 ml/min (by C-G formula based on Cr of 1.07).   Medications:  Prescriptions prior to admission  Medication Sig Dispense Refill  . acetaminophen (TYLENOL) 650 MG CR tablet Take 650 mg by mouth 2 (two) times daily.       Marland Kitchen albuterol (PROVENTIL HFA;VENTOLIN HFA) 108 (90 BASE) MCG/ACT inhaler Inhale 1-2 puffs into the lungs every 6 (six) hours as needed for wheezing or shortness of breath.      Marland Kitchen aspirin 81 MG tablet Take 81 mg by mouth daily.        Marland Kitchen atorvastatin (LIPITOR) 20 MG tablet take 1 tablet by mouth once daily  30 tablet  11  . calcium carbonate (TUMS - DOSED IN MG ELEMENTAL CALCIUM) 500 MG chewable tablet Chew 1-2 tablets by mouth daily as needed for indigestion or heartburn. As needed      . cholecalciferol (VITAMIN D) 1000 UNITS tablet Take 1,000 Units by mouth daily.        . famotidine (PEPCID) 10 MG tablet Take 10 mg by mouth at bedtime.        . fluticasone (FLONASE) 50 MCG/ACT nasal spray instill 2 sprays into each nostril once daily  16 g  11  . Fluticasone-Salmeterol (ADVAIR) 250-50 MCG/DOSE AEPB Inhale 1 puff into the lungs 2 (two) times daily. Rinse mouth well after use      . furosemide (LASIX) 40 MG tablet 1 AM 1 PM      . loratadine (CLARITIN) 10 MG tablet Take 10 mg by mouth daily.       . polyethylene glycol (MIRALAX / GLYCOLAX) packet Take 17 g by mouth as needed.      . potassium chloride SA (KLOR-CON M20) 20 MEQ tablet Take 20 mEq by mouth daily.       . predniSONE (DELTASONE) 10 MG tablet Take 5 mg by mouth every other day.       . Probiotic Product (ALIGN PO) Take 1 capsule by mouth daily.      Marland Kitchen warfarin (COUMADIN) 4 MG tablet Take 4 mg by mouth daily.        Assessment: 79 y.o. male on coumadin for h/o DVT and PE, afib. INR remains stable on home dose. No bleeding noted. Cipro will potentiate the effects of warfarin.  Goal of Therapy:  INR 2-3 Monitor platelets by anticoagulation protocol: Yes   Plan:  1. Continue Coumadin 4mg  daily 2. Daily INR  Christoper Fabian, PharmD, BCPS Clinical pharmacist, pager [phone] 01/03/2013,12:14 PM

## 2013-01-03 NOTE — Progress Notes (Signed)
Subjective: No C/o's. Dr Early evaluated the pt and is concerned about healing potential if has amputation. No right 5 th toe pain.  Objective: Vital signs in last 24 hours: Temp:  [97.4 F (36.3 C)-97.7 F (36.5 C)] 97.4 F (36.3 C) (11/27 8295) Pulse Rate:  [65-77] 65 (11/27 0614) Resp:  [17-18] 17 (11/27 0614) BP: (121-135)/(73-82) 121/77 mmHg (11/27 0614) SpO2:  [95 %-100 %] 95 % (11/27 0614)  Intake/Output from previous day: 11/26 0701 - 11/27 0700 In: 150 [IV Piggyback:150] Out: 1225 [Urine:1225] Intake/Output this shift:     Recent Labs  01/01/13 1620  HGB 14.4    Recent Labs  01/01/13 1620  WBC 8.5  RBC 4.54  HCT 43.7  PLT 160    Recent Labs  01/01/13 1620 01/03/13 0355  NA 136 138  K 4.5 3.9  CL 98 105  CO2 28 26  BUN 22 23  CREATININE 1.23 1.07  GLUCOSE 113* 93  CALCIUM 10.8* 9.0    Recent Labs  01/02/13 0558 01/03/13 0355  INR 2.02* 2.32*  Right 5th toe exam: No tenderness. Mild swelling. Redness improving. Distal pulses absent.   Assessment/Plan: Right 5th toe infection with poor circulation. Plan: Will cancel 5th toe amp for tomorrow. Dr Arbie Cookey will work up vascular issues. Cont on IV antibiotics. Will follow pt Discussed with pt and he is happy with the tx plan   Glenn Barron 01/03/2013, 11:05 AM

## 2013-01-04 ENCOUNTER — Encounter (HOSPITAL_COMMUNITY)
Admission: EM | Disposition: A | Discharge status: Nursing Home (Includes ICF) | Payer: Self-pay | Source: Home / Self Care | Attending: Internal Medicine

## 2013-01-04 DIAGNOSIS — I82409 Acute embolism and thrombosis of unspecified deep veins of unspecified lower extremity: Secondary | ICD-10-CM

## 2013-01-04 DIAGNOSIS — I739 Peripheral vascular disease, unspecified: Secondary | ICD-10-CM

## 2013-01-04 LAB — PROTIME-INR: INR: 2.05 — ABNORMAL HIGH (ref 0.00–1.49)

## 2013-01-04 LAB — WOUND CULTURE

## 2013-01-04 LAB — VANCOMYCIN, TROUGH: Vancomycin Tr: 19.8 ug/mL (ref 10.0–20.0)

## 2013-01-04 SURGERY — AMPUTATION, FOOT, RAY
Anesthesia: Choice | Laterality: Right

## 2013-01-04 SURGERY — AMPUTATION, FOOT, RAY
Anesthesia: General | Laterality: Right

## 2013-01-04 MED ORDER — BISACODYL 10 MG RE SUPP
10.0000 mg | Freq: Every day | RECTAL | Status: DC | PRN
  Administered 2013-01-04: 10 mg via RECTAL
  Filled 2013-01-04: qty 1

## 2013-01-04 MED ORDER — SENNOSIDES-DOCUSATE SODIUM 8.6-50 MG PO TABS
2.0000 | ORAL_TABLET | Freq: Two times a day (BID) | ORAL | Status: DC
  Administered 2013-01-04 – 2013-01-07 (×4): 2 via ORAL
  Administered 2013-01-08: 1 via ORAL
  Administered 2013-01-09: 2 via ORAL
  Filled 2013-01-04 (×9): qty 2

## 2013-01-04 NOTE — Progress Notes (Signed)
ANTIBIOTIC CONSULT NOTE - FOLLOW UP  Pharmacy Consult for Vancomycin/Cipro Indication: Osteomyelitis of R-toe, cellulitis/myositis RLE  Allergies  Allergen Reactions  . Doxycycline     Rash  . Amoxicillin Rash    Yeast infection    Estimated Creatinine Clearance: 50.4 ml/min (by C-G formula based on Cr of 1.07).  Recent Labs  01/04/13 2120  VANCOTROUGH 19.8    Assessment: 79 y/o M on vanco/cipro for osteo of the R-5th toe. Vancomycin trough is 19.8  Goal of Therapy:  Vancomycin trough level 15-20 mcg/ml  Plan:  -Continue vancomycin 750 mg IV q12h -Cipro 400 mg IV q12h -Trend WBC, temp, renal function  -Repeat drug levels PRN  Thank you for allowing me to take part in this patient's care,  Abran Duke, PharmD Clinical Pharmacist Phone: [phone] Pager: [phone] 01/04/2013 11:27 PM

## 2013-01-04 NOTE — Evaluation (Signed)
Physical Therapy Evaluation Patient Details Name: Glenn Barron MRN: [id number] DOB: 12/12/1922 Today's Date: 01/04/2013 Time: 4098-1191 PT Time Calculation (min): 28 min  PT Assessment / Plan / Recommendation History of Present Illness  [name] an 79 y/o male admitted for infection of right 5th toe, which is being treated non-operatively as there is concern for healing potential after amputation.  Clinical Impression  This patient presents with acute pain and decreased functional independence following the above mentioned procedure. At the time of PT eval, pt required mod assist to come to full stand at EOB. This patient is appropriate for skilled PT interventions in the acute care setting to address functional limitations, improve safety and independence with functional mobility, and return to PLOF. Pt reports that he has sustained a decline in function as a result of this episode with his toe. Although pt has a good system in place at home to get ADL's done, he lives alone and only has aides available for 8 hours a day. Continued PT at a SNF is recommended for pt to return to baseline.     PT Assessment  Patient needs continued PT services    Follow Up Recommendations  SNF    Does the patient have the potential to tolerate intense rehabilitation      Barriers to Discharge        Equipment Recommendations  None recommended by PT    Recommendations for Other Services     Frequency Min 3X/week    Precautions / Restrictions Precautions Precautions: Fall Restrictions Weight Bearing Restrictions: No   Pertinent Vitals/Pain Pt reports minimal pain during ambulation.       Mobility  Bed Mobility Bed Mobility: Supine to Sit;Sitting - Scoot to Edge of Bed Supine to Sit: 4: Min assist;HOB elevated;With rails Sitting - Scoot to Edge of Bed: 4: Min guard Details for Bed Mobility Assistance: Assist to come to full sitting. Heavy use of bed rails to transition to  EOB. Transfers Transfers: Sit to Stand;Stand to Sit Sit to Stand: 3: Mod assist;From bed;With upper extremity assist Stand to Sit: 4: Min assist;To chair/3-in-1;With upper extremity assist Details for Transfer Assistance: VC's for hand placement and mod assist to come to full standing as pt was losing balance posteriorly. Ambulation/Gait Ambulation/Gait Assistance: 4: Min assist Ambulation Distance (Feet): 12 Feet Assistive device: Rolling walker Ambulation/Gait Assistance Details: Some assist for walker placement. Pt with little warning before beginning to sit down. Close chair follow required.  Gait Pattern: Step-through pattern;Shuffle;Decreased stride length;Trunk flexed Gait velocity: Decreased General Gait Details: Condom cath falls off easily during ambulation    Exercises     PT Diagnosis: Difficulty walking  PT Problem List: Decreased strength;Decreased range of motion;Decreased activity tolerance;Decreased balance;Decreased mobility;Decreased knowledge of use of DME;Decreased safety awareness PT Treatment Interventions: DME instruction;Gait training;Stair training;Functional mobility training;Therapeutic activities;Therapeutic exercise;Neuromuscular re-education;Patient/family education     PT Goals(Current goals can be found in the care plan section) Acute Rehab PT Goals Patient Stated Goal: To return home PT Goal Formulation: With patient Time For Goal Achievement: 01/11/13 Potential to Achieve Goals: Good  Visit Information  Last PT Received On: 01/04/13 Assistance Needed: +2 (chair follow) History of Present Illness: [name] an 79 y/o male admitted for infection of right 5th toe, which is being treated non-operatively as there is concern for healing after amputation.       Prior Functioning  Home Living Family/patient expects to be discharged to:: Private residence Living Arrangements: Alone Available Help at Discharge:  Personal care attendant (8 hours a day) Type  of Home: Apartment Home Access: Stairs to enter;Ramped entrance Entrance Stairs-Number of Steps: 1 Home Layout: One level Home Equipment: Walker - 2 wheels;Wheelchair - manual;Grab bars - toilet;Grab bars - tub/shower (Spring on commode to assist with standing ) Prior Function Level of Independence: Needs assistance Gait / Transfers Assistance Needed: Dan Humphreys all the time ADL's / Homemaking Assistance Needed: Assist from aides Communication Communication: No difficulties Dominant Hand: Right    Cognition  Cognition Arousal/Alertness: Awake/alert Behavior During Therapy: WFL for tasks assessed/performed Overall Cognitive Status: Within Functional Limits for tasks assessed    Extremity/Trunk Assessment Upper Extremity Assessment Upper Extremity Assessment: Defer to OT evaluation Lower Extremity Assessment Lower Extremity Assessment: Generalized weakness Cervical / Trunk Assessment Cervical / Trunk Assessment: Kyphotic   Balance Balance Balance Assessed: Yes Static Sitting Balance Static Sitting - Balance Support: Feet supported;Bilateral upper extremity supported Static Sitting - Level of Assistance: 4: Min Oncologist Standing - Balance Support: Bilateral upper extremity supported Static Standing - Level of Assistance: 4: Min assist;3: Mod assist  End of Session PT - End of Session Equipment Utilized During Treatment: Gait belt Activity Tolerance: Patient limited by fatigue Patient left: in chair;with call bell/phone within reach Nurse Communication: Mobility status  GP     Ruthann Cancer 01/04/2013, 3:01 PM  Ruthann Cancer, PT, DPT [phone]

## 2013-01-04 NOTE — Progress Notes (Addendum)
Subjective: No c/o's.  "I don't want my toe amputated unless I have no other choice" Denies foot /toe pain.  Objective: Vital signs in last 24 hours: Temp:  [97.3 F (36.3 C)-97.6 F (36.4 C)] 97.6 F (36.4 C) (11/28 0523) Pulse Rate:  [64-71] 64 (11/28 0523) Resp:  [18] 18 (11/28 0523) BP: (110-138)/(79-81) 138/79 mmHg (11/28 0523) SpO2:  [97 %-98 %] 98 % (11/28 0523)  Intake/Output from previous day: 11/27 0701 - 11/28 0700 In: -  Out: 800 [Urine:800] Intake/Output this shift:     Recent Labs  01/01/13 1620  HGB 14.4    Recent Labs  01/01/13 1620  WBC 8.5  RBC 4.54  HCT 43.7  PLT 160    Recent Labs  01/01/13 1620 01/03/13 0355  NA 136 138  K 4.5 3.9  CL 98 105  CO2 28 26  BUN 22 23  CREATININE 1.23 1.07  GLUCOSE 113* 93  CALCIUM 10.8* 9.0    Recent Labs  01/03/13 0355 01/04/13 0435  INR 2.32* 2.05*  Right 5th toe exam: Mild redness/swelling. No tenderness.  Assessment/Plan: Osteomyelitis right 5th toe Plan: Will try to heal toe with IV antibiotics/non op care at this point. Further workup per vascular service. They were concerned about healing potential of wound if has 5th toe amp.   Kristiann Noyce G 01/04/2013, 11:17 AM

## 2013-01-04 NOTE — Progress Notes (Signed)
[facility] PROGRESS NOTE  Glenn Barron:[id number] DOB: [birth year] DOA: 01/01/2013 PCP: Michele Mcalpine, MD  Assessment/Plan: 1. 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  - MRI Showed OM of the small toe.  - pain control .  - venous duplex showed chronic DVT. - vascular consulted and recommendations given .   2. Cellulitis and myositis of the RLE: - IV antibiotics on b oard.  - elevate leg.   3. Atrial fibrillation: - rate controlled.  - on chronic anticoagulation with therapeutic INR.   4. Hypertension:  Better controlled.   DVT prophylaxis.   Code Status: full code Family Communication: none at bedside, discussed the plan of care with pt, Disposition Plan: pending PT eval.    Consultants:  Orthopedics   Procedures:  MRI of the RIGHT leg  Venous duplex of the Lower extremity pending.   Antibiotics:  Vancomycin 11/25  Rocephin 11 25 one dose  Ciprofloxacin 11/26  HPI/Subjective: Pain improved. comfortable  Objective: Filed Vitals:   01/04/13 1302  BP: 138/64  Pulse: 70  Temp: 98 F (36.7 C)  Resp: 17    Intake/Output Summary (Last 24 hours) at 01/04/13 1829 Last data filed at 01/04/13 1348  Gross per 24 hour  Intake      0 ml  Output   1100 ml  Net  -1100 ml   Filed Weights   01/01/13 2037  Weight: 74.707 kg (164 lb 11.2 oz)    Exam:   General:  Alert afebrile comfortable  Cardiovascular: s1s2  Respiratory: ctab  Abdomen: soft NT ND BS+  Musculoskeletal: right LE swollen, red and tender. Cellulitic changes.   Data Reviewed: Basic Metabolic Panel:  Recent Labs Lab 01/01/13 1620 01/03/13 0355  NA 136 138  K 4.5 3.9  CL 98 105  CO2 28 26  GLUCOSE 113* 93  BUN 22 23  CREATININE 1.23 1.07  CALCIUM 10.8* 9.0   Liver Function Tests: No results found for this basename: AST, ALT, ALKPHOS, BILITOT, PROT, ALBUMIN,  in the  last 168 hours No results found for this basename: LIPASE, AMYLASE,  in the last 168 hours No results found for this basename: AMMONIA,  in the last 168 hours CBC:  Recent Labs Lab 01/01/13 1620  WBC 8.5  NEUTROABS 6.0  HGB 14.4  HCT 43.7  MCV 96.3  PLT 160   Cardiac Enzymes: No results found for this basename: CKTOTAL, CKMB, CKMBINDEX, TROPONINI,  in the last 168 hours BNP (last 3 results) No results found for this basename: PROBNP,  in the last 8760 hours CBG: No results found for this basename: GLUCAP,  in the last 168 hours  Recent Results (from the past 240 hour(s))  MRSA PCR SCREENING     Status: None   Collection Time    01/01/13 10:35 PM      Result Value Range Status   MRSA by PCR NEGATIVE  NEGATIVE Final   Comment:            The GeneXpert MRSA Assay (FDA     approved for NASAL specimens     only), is one component of a     comprehensive MRSA colonization     surveillance program. It is not     intended to diagnose MRSA     infection nor to guide or     monitor treatment for     MRSA  infections.  WOUND CULTURE     Status: None   Collection Time    01/02/13  2:42 PM      Result Value Range Status   Specimen Description WOUND RIGHT TOE   Final   Special Requests RT BABY TOE   Final   Gram Stain     Final   Value: RARE WBC PRESENT, PREDOMINANTLY PMN     NO SQUAMOUS EPITHELIAL CELLS SEEN     NO ORGANISMS SEEN     Performed at [facility]   Culture     Final   Value: FEW STAPHYLOCOCCUS SPECIES (COAGULASE NEGATIVE)     Performed at [facility]   Report Status 01/04/2013 FINAL   Final     Studies: Ct Angio Ao+bifem W/cm &/or Wo/cm  01/03/2013   CLINICAL DATA:  Right foot ischemia.  EXAM: CT ANGIOGRAPHY OF ABDOMINAL AORTA WITH ILIOFEMORAL RUNOFF  TECHNIQUE: Multidetector CT imaging of the abdomen, pelvis and lower extremities was performed using the standard protocol during bolus administration of intravenous contrast. Multiplanar CT image  reconstructions including MIPs were obtained to evaluate the vascular anatomy.  CONTRAST:  OMNIPAQUE IOHEXOL 350 MG/ML SOLN  COMPARISON:  CTA of July 19, 2011.  FINDINGS: Aorta: Celiac artery is patent with mild atheromatous disease noted at its origin. Superior mesenteric artery is patent with mild calcified plaque at its origin. Moderate calcified atheromatous plaque is noted at the origin of the right renal artery, with similar findings seen on the left. Super renal abdominal aorta has a maximum measured AP diameter 3.4 cm consistent with mild aneurysmal dilatation. Atheromatous disease is noted in this area. The patient is status post aortobifemoral graft placement, with right limb tied in to right common iliac artery. The left limb is attached to the left external iliac artery. The distal portion of the right common iliac artery is dilated at 3.2 cm which is not significantly changed compared to prior exam. There is retrograde filling of the left internal iliac artery with aneurysmal dilatation measuring 3.8 cm which is not significantly changed compared to prior exam.  Right Lower Extremity: Right common femoral artery is mildly dilated at 1.8 cm. CONTRAST filling is seen involving the right superficial femoral artery, which demonstrates atherosclerotic calcifications. Poor CONTRAST filling of the distal right superficial from artery is noted which may represent bolus timing issues. 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 This appears to be connected to smaller aneurysm more distally measuring 4.6 x 4.2 cm. There does not appear to be any significant contrast filling in the right popliteal or tibial arteries which may represent bolus timing issue. Subcutaneous edema is noted in the right calf.  Left Lower Extremity: 4.0 x 3.1 cm aneurysm of  the left common iliac artery is noted with substantial mural thrombus. There is poor contrast filling of the vessels distal to this suggesting bolus timing issue. Atherosclerotic calcifications of the left superficial femoral artery noted with stent graft seen distally passing through a large bilobed pseudoaneurysm, which is again noted involving the distal left superficial femoral artery measuring 7.6 x 6.6 cm, which is significantly improved compared to prior exam. The left popliteal and tibial arteries cannot be evaluated due to poor contrast opacification. There is a substantial amount of subcutaneous edema present in the left calf.  Review of the MIP  images confirms the above findings.  IMPRESSION: 3.4 cm suprarenal abdominal aortic aneurysmal dilatation is noted. Status post aortobifemoral bypass graft placement ; the graft and its limbs are widely patent.  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  3.8 cm left common iliac artery aneurysm is noted which fills in retrograde manner from left external iliac artery from the bypass.  4.0 cm left common iliac artery aneurysm is noted.  There is poor contrast opacification beyond the distal right superficial femoral artery and left common femoral artery most likely due to contrast timing issue. Therefore, the patency of the vessels distal to these areas cannot be assessed. However, there is a large bilobed pseudoaneurysm measuring 5.5 x 3.9 cm involving the distal right superficial femoral artery which does demonstrate some degree of mural thrombus and appears to be patent. It is increased in size compared to prior exam. There is also noted a very large bilobed pseudoaneurysm arising from the distal left superficial femoral artery which measures 7.6 x 6.6 cm, which is smaller compared to prior exam ; there is seen a stent graft passing through this aneurysm extending to the proximal left popliteal artery. Doppler evaluation is recommended to evaluate  the patency of these distal superficial femoral artery pseudoaneurysms due to the poor or lack of contrast opacification.   Electronically Signed   By: Roque Lias M.D.   On: 01/03/2013 16:18    Scheduled Meds: . acetaminophen  650 mg Oral BID  . aspirin EC  81 mg Oral Daily  . atorvastatin  20 mg Oral q1800  . cholecalciferol  1,000 Units Oral Daily  . ciprofloxacin  400 mg Intravenous Q24H  . famotidine  10 mg Oral QHS  . FLORA-Q  1 capsule Oral Daily  . fluticasone  1 spray Each Nare BID  . furosemide  40 mg Oral BID  . mometasone-formoterol  2 puff Inhalation BID  . potassium chloride SA  20 mEq Oral Daily  . predniSONE  5 mg Oral Q48H  . senna-docusate  2 tablet Oral BID  . sodium chloride  3 mL Intravenous Q12H  . sodium chloride  3 mL Intravenous Q12H  . vancomycin  750 mg Intravenous Q12H  . warfarin  4 mg Oral q1800  . Warfarin - Pharmacist Dosing Inpatient   Does not apply q1800   Continuous Infusions:   Principal Problem:   Cellulitis Active Problems:   PROSTATE CANCER   HYPERLIPIDEMIA   HYPERTENSION   CONGESTIVE HEART FAILURE   CEREBROVASCULAR DISEASE   Edema   Aneurysm of artery of lower extremity   Atrial fibrillation   Cellulitis and abscess    Time spent: 30 min    Blasa Raisch  [facility] Pager [phone]. If 7PM-7AM, please contact night-coverage at [email or web address], password [facility] 01/04/2013, 6:29 PM  LOS: 3 days

## 2013-01-04 NOTE — Progress Notes (Signed)
Patient ID: Glenn Barron, male   DOB: Nov 04, 1922, 79 y.o.   MRN: [id number] He is comfortable with minimal pain in his foot. I reviewed his CT scan. Unfortunately there was very poor timing of the contrast and therefore no information was obtained from runoff from his mid size distally. Right foot shows some drainage today on the lateral aspect of the metatarsal head. There is no erythema and this is serous.  Again a long discussion with the patient. I feel that he has very little chance for healing of a toe amputation without revascularization. Revascularization would be expected very ask extensive procedure due to his known occluded popliteal artery aneurysm. He would require a bypass to the tibial vessels. Did explain that he certainly is at risk for a nonhealing amputation that would certainly recommend conservative treatment with long-term antibiotics and suppression. Will defer appropriate antibiotics to the medical team. He also is having significant deconditioning while being in the hospital. He certainly was very borderline at living independently with nearly around-the-clock aides.  We'll see again in the office in about 2 weeks. I am happy to take care of her care regarding his toe amputation if required and below knee amputation if required. Appreciate orthopedics assistance as well. We'll see again Monday if he is still in the patient in the hospital

## 2013-01-04 NOTE — Progress Notes (Signed)
ANTIBIOTIC  / ANTI-COAGULATION CONSULT NOTE - follow up Pharmacy Consult for Vancomycin/ciprofloxacin and coumadin Indication:osteomyelitis of small toe R foot / hx DVT/PE and Afib  Allergies  Allergen Reactions  . Doxycycline     Rash  . Amoxicillin Rash    Yeast infection     Patient Measurements: Weight = 78.1 kg  Labs:  Recent Labs  01/01/13 1620 01/03/13 0355  WBC 8.5  --   HGB 14.4  --   PLT 160  --   CREATININE 1.23 1.07     Assessment: 79 y.o. male on coumadin for h/o DVT and PE, afib. INR (2.05) remains stable on home dose of 4 mg daily. No bleeding noted. Cipro will potentiate the effects of warfarin.  He is on day # 4 of vanc/cipro for OM of small toe on R foot as well as cellulitis/myositis of RLE.  Ortho cancelled amputation of R 5th toe due to high risk of nonhealing from poor circulation.  PVD being worked up by [facility].  AF  vanc 11/25>> cipro 11/25>>  11/26 R toe: few coag neg staph F   Goal of Therapy:  Vancomycin trough level 15-20 mcg/ml INR = 2 to 3  Plan:  1) continue Vancomycin 750 mg iv Q 12 hours and check VT tonight prior to 6th maintenance dose at 2030 2) continue cipro 400 mg IV q24 2) Coumadin 4 mg po daily at 1800 pm 3) change daily INR to MWF INR 4) Follow up Scr, cultures, fever curve, plan Herby Abraham, Pharm.D. [phone] 01/04/2013 8:39 AM

## 2013-01-05 DIAGNOSIS — M869 Osteomyelitis, unspecified: Principal | ICD-10-CM | POA: Diagnosis present

## 2013-01-05 DIAGNOSIS — I724 Aneurysm of artery of lower extremity: Secondary | ICD-10-CM

## 2013-01-05 NOTE — Progress Notes (Signed)
VASCULAR LAB PRELIMINARY  ARTERIAL  ABI completed:  ABI is not ascertained due to the posterior tibial artery being non compressible bilaterally.     RIGHT    LEFT    PRESSURE WAVEFORM  PRESSURE WAVEFORM  BRACHIAL 123 triphasic BRACHIAL 122 triphasic  DP   DP    AT 149 Severely dampened monophasic AT 137 triphasic  PT >297 Dampened monophasic PT >295 Dampened monophasic  PER   PER    GREAT TOE 126 NA GREAT TOE 170 NA    RIGHT LEFT  ABI N/A N/A     Keyshon Stein, RVT 01/05/2013, 9:29 AM

## 2013-01-05 NOTE — Progress Notes (Signed)
[facility] PROGRESS NOTE  JAVARES KAUFHOLD XLK:[id number] DOB: 1922/10/08 DOA: 01/01/2013 PCP: Michele Mcalpine, MD  Assessment/Plan: 1. 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 Will get ID involved for the duration of the antibiotic.  - orthopedics consulted.  - MRI Showed OM of the small toe.  - pain control .  - venous duplex showed chronic DVT. - vascular consulted and recommendations given . Will continue with IV antibiotics and conservative management, as healing of the toe amputation without revascularization will be very difficult, in view of his occluded popliteal artery. Discussed witht he patient and he agrees to a conservative approach and IV antibiotics.   2. Cellulitis and myositis of the RLE: - IV antibiotics on b oard.  - elevate leg.   3. Atrial fibrillation: - rate controlled.  - on chronic anticoagulation with therapeutic INR.   4. Hypertension:  Better controlled.   DVT prophylaxis.   Code Status: full code Family Communication: none at bedside, discussed the plan of care with pt, Disposition Plan: SNF placement.    Consultants:  Orthopedics Vascular surgery.   Procedures:  MRI of the RIGHT leg  Venous duplex of the Lower extremity pending.   Antibiotics:  Vancomycin 11/25  Rocephin 11 25 one dose  Ciprofloxacin 11/26  HPI/Subjective: Pain improved. Comfortable, constipation resolved.   Objective: Filed Vitals:   01/05/13 0525  BP: 131/62  Pulse: 72  Temp: 97.6 F (36.4 C)  Resp: 18    Intake/Output Summary (Last 24 hours) at 01/05/13 1059 Last data filed at 01/05/13 0525  Gross per 24 hour  Intake      0 ml  Output   1550 ml  Net  -1550 ml   Filed Weights   01/01/13 2037  Weight: 74.707 kg (164 lb 11.2 oz)    Exam:   General:  Alert afebrile comfortable  Cardiovascular: s1s2  Respiratory: ctab  Abdomen:  soft NT ND BS+  Musculoskeletal: improved Cellulitic changes.   Data Reviewed: Basic Metabolic Panel:  Recent Labs Lab 01/01/13 1620 01/03/13 0355  NA 136 138  K 4.5 3.9  CL 98 105  CO2 28 26  GLUCOSE 113* 93  BUN 22 23  CREATININE 1.23 1.07  CALCIUM 10.8* 9.0   Liver Function Tests: No results found for this basename: AST, ALT, ALKPHOS, BILITOT, PROT, ALBUMIN,  in the last 168 hours No results found for this basename: LIPASE, AMYLASE,  in the last 168 hours No results found for this basename: AMMONIA,  in the last 168 hours CBC:  Recent Labs Lab 01/01/13 1620  WBC 8.5  NEUTROABS 6.0  HGB 14.4  HCT 43.7  MCV 96.3  PLT 160   Cardiac Enzymes: No results found for this basename: CKTOTAL, CKMB, CKMBINDEX, TROPONINI,  in the last 168 hours BNP (last 3 results) No results found for this basename: PROBNP,  in the last 8760 hours CBG: No results found for this basename: GLUCAP,  in the last 168 hours  Recent Results (from the past 240 hour(s))  MRSA PCR SCREENING     Status: None   Collection Time    01/01/13 10:35 PM      Result Value Range Status   MRSA by PCR NEGATIVE  NEGATIVE Final   Comment:            The GeneXpert MRSA Assay (FDA     approved for  NASAL specimens     only), is one component of a     comprehensive MRSA colonization     surveillance program. It is not     intended to diagnose MRSA     infection nor to guide or     monitor treatment for     MRSA infections.  WOUND CULTURE     Status: None   Collection Time    01/02/13  2:42 PM      Result Value Range Status   Specimen Description WOUND RIGHT TOE   Final   Special Requests RT BABY TOE   Final   Gram Stain     Final   Value: RARE WBC PRESENT, PREDOMINANTLY PMN     NO SQUAMOUS EPITHELIAL CELLS SEEN     NO ORGANISMS SEEN     Performed at [facility]   Culture     Final   Value: FEW STAPHYLOCOCCUS SPECIES (COAGULASE NEGATIVE)     Performed at [facility]   Report  Status 01/04/2013 FINAL   Final     Studies: Ct Angio Ao+bifem W/cm &/or Wo/cm  01/03/2013   CLINICAL DATA:  Right foot ischemia.  EXAM: CT ANGIOGRAPHY OF ABDOMINAL AORTA WITH ILIOFEMORAL RUNOFF  TECHNIQUE: Multidetector CT imaging of the abdomen, pelvis and lower extremities was performed using the standard protocol during bolus administration of intravenous contrast. Multiplanar CT image reconstructions including MIPs were obtained to evaluate the vascular anatomy.  CONTRAST:  OMNIPAQUE IOHEXOL 350 MG/ML SOLN  COMPARISON:  CTA of July 19, 2011.  FINDINGS: Aorta: Celiac artery is patent with mild atheromatous disease noted at its origin. Superior mesenteric artery is patent with mild calcified plaque at its origin. Moderate calcified atheromatous plaque is noted at the origin of the right renal artery, with similar findings seen on the left. Super renal abdominal aorta has a maximum measured AP diameter 3.4 cm consistent with mild aneurysmal dilatation. Atheromatous disease is noted in this area. The patient is status post aortobifemoral graft placement, with right limb tied in to right common iliac artery. The left limb is attached to the left external iliac artery. The distal portion of the right common iliac artery is dilated at 3.2 cm which is not significantly changed compared to prior exam. There is retrograde filling of the left internal iliac artery with aneurysmal dilatation measuring 3.8 cm which is not significantly changed compared to prior exam.  Right Lower Extremity: Right common femoral artery is mildly dilated at 1.8 cm. CONTRAST filling is seen involving the right superficial femoral artery, which demonstrates atherosclerotic calcifications. Poor CONTRAST filling of the distal right superficial from artery is noted which may represent bolus timing issues. 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 This appears to be connected to smaller aneurysm more distally measuring 4.6 x 4.2 cm. There does not appear to be any significant contrast filling in the right popliteal or tibial arteries which may represent bolus timing issue. Subcutaneous edema is noted in the right calf.  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 There is poor contrast filling of the vessels distal to this suggesting bolus timing issue. Atherosclerotic calcifications of the left superficial femoral artery noted with stent graft seen distally passing through a large bilobed  pseudoaneurysm, which is again noted involving the distal left superficial femoral artery measuring 7.6 x 6.6 cm, which is significantly improved compared to prior exam. The left popliteal and tibial arteries cannot be evaluated due to poor contrast opacification. There is a substantial amount of subcutaneous edema present in the left calf.  Review of the MIP images confirms the above findings.  IMPRESSION: 3.4 cm suprarenal abdominal aortic aneurysmal dilatation is noted. Status post aortobifemoral bypass graft placement ; the graft and its limbs are widely patent.  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  3.8 cm left common iliac artery aneurysm is noted which fills in retrograde manner from left external iliac artery from the bypass.  4.0 cm left common iliac artery aneurysm is noted.  There is poor contrast opacification beyond the distal right superficial femoral artery and left common femoral artery most likely due to contrast timing issue. Therefore, the patency of the vessels distal to these areas cannot be assessed. 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 It is increased in size compared to prior exam. There is also noted a very large bilobed pseudoaneurysm arising from the distal left superficial femoral artery which measures 7.6 x 6.6 cm, which is smaller compared to prior exam ; there is seen a stent graft passing through this aneurysm extending to the proximal left popliteal artery. Doppler evaluation is recommended to evaluate the patency of these distal superficial femoral artery pseudoaneurysms due to the poor or lack of contrast opacification.   Electronically Signed   By: Roque Lias M.D.   On: 01/03/2013 16:18    Scheduled Meds: . acetaminophen  650 mg Oral BID  . aspirin EC  81 mg Oral Daily  . atorvastatin  20 mg Oral q1800  . cholecalciferol  1,000 Units Oral Daily  . ciprofloxacin  400 mg Intravenous Q24H  . famotidine  10 mg Oral QHS  . FLORA-Q  1 capsule Oral Daily  . fluticasone  1 spray Each Nare BID  . furosemide  40 mg Oral BID  . mometasone-formoterol  2 puff Inhalation BID  . potassium chloride SA  20 mEq Oral Daily  . predniSONE  5 mg Oral Q48H  . senna-docusate  2 tablet Oral BID  . sodium chloride  3 mL Intravenous Q12H  . sodium chloride  3 mL Intravenous Q12H  . vancomycin  750 mg Intravenous Q12H  . warfarin  4 mg Oral q1800  . Warfarin - Pharmacist Dosing Inpatient   Does not apply q1800   Continuous Infusions:   Principal Problem:   Cellulitis Active Problems:   PROSTATE CANCER   HYPERLIPIDEMIA   HYPERTENSION   CONGESTIVE HEART FAILURE   CEREBROVASCULAR DISEASE   Edema   Aneurysm of artery of lower extremity   Atrial fibrillation   Cellulitis and abscess    Time spent: 20 min    Shanikka Wonders  [facility] Pager [phone]. If 7PM-7AM, please contact night-coverage at [email or web address], password [facility] 01/05/2013, 10:59 AM  LOS: 4 days

## 2013-01-05 NOTE — Progress Notes (Signed)
Subjective: No complaints related to his right fifth toe.He was seen by Dr. Arbie Cookey who recommendedconservative treatment with long-term antibiotics.  He again was concerned about healing potential with an amputation.  Objective: Vital signs in last 24 hours: Temp:  [97.6 F (36.4 C)-98.1 F (36.7 C)] 97.6 F (36.4 C) (11/29 0525) Pulse Rate:  [68-72] 72 (11/29 0525) Resp:  [18] 18 (11/29 0525) BP: (113-131)/(62-73) 131/62 mmHg (11/29 0525) SpO2:  [96 %-98 %] 96 % (11/29 0940)  Intake/Output from previous day: 11/28 0701 - 11/29 0700 In: -  Out: 1550 [Urine:1550] Intake/Output this shift:    Recent Labs  01/03/13 0355  NA 138  K 3.9  CL 105  CO2 26  BUN 23  CREATININE 1.07  GLUCOSE 93  CALCIUM 9.0    Recent Labs  01/03/13 0355 01/04/13 0435  INR 2.32* 2.05*   Right foot exam: Mild redness of the fifth toe with some mild serous drainage around the base of the toe/fifth metatarsal head area.  Assessment/Plan: Osteomyelitis right fifth toe Plan: We agree with Dr. Arbie Cookey about the foot/toe.  We suggest suppressive antibiotic treatment and close followup.  It appears that Dr. Arbie Cookey will manage the case and will be seeing the patient in the office in 2 weeks if he goes home before Monday.I discussed his case with Dr. Luiz Blare in detail. We will sign off.please call us if needed.  Dr. Luiz Blare cell phone number is [phone].   Tysheem Accardo G 01/05/2013, 1:10 PM

## 2013-01-06 ENCOUNTER — Inpatient Hospital Stay (HOSPITAL_COMMUNITY): Payer: Medicare Other

## 2013-01-06 DIAGNOSIS — J9600 Acute respiratory failure, unspecified whether with hypoxia or hypercapnia: Secondary | ICD-10-CM | POA: Diagnosis not present

## 2013-01-06 LAB — BLOOD GAS, ARTERIAL
Acid-base deficit: 1.4 mmol/L (ref 0.0–2.0)
Drawn by: 27733
FIO2: 1 %
O2 Saturation: 99.4 %
Patient temperature: 98.2

## 2013-01-06 LAB — URINALYSIS, ROUTINE W REFLEX MICROSCOPIC
Bilirubin Urine: NEGATIVE
Glucose, UA: NEGATIVE mg/dL
Hgb urine dipstick: NEGATIVE
Leukocytes, UA: NEGATIVE
Specific Gravity, Urine: 1.005 (ref 1.005–1.030)
Urobilinogen, UA: 0.2 mg/dL (ref 0.0–1.0)
pH: 6.5 (ref 5.0–8.0)

## 2013-01-06 LAB — CBC WITH DIFFERENTIAL/PLATELET
Basophils Relative: 0 % (ref 0–1)
Eosinophils Absolute: 0 10*3/uL (ref 0.0–0.7)
Eosinophils Relative: 0 % (ref 0–5)
HCT: 46.2 % (ref 39.0–52.0)
HCT: 43.1 % (ref 39.0–52.0)
Hemoglobin: 14.2 g/dL (ref 13.0–17.0)
Lymphocytes Relative: 8 % — ABNORMAL LOW (ref 12–46)
Lymphocytes Relative: 12 % (ref 12–46)
Lymphs Abs: 1.6 10*3/uL (ref 0.7–4.0)
Lymphs Abs: 2.5 10*3/uL (ref 0.7–4.0)
MCHC: 32.9 g/dL (ref 30.0–36.0)
MCV: 96.7 fL (ref 78.0–100.0)
Monocytes Relative: 12 % (ref 3–12)
Monocytes Relative: 12 % (ref 3–12)
Neutro Abs: 15.2 10*3/uL — ABNORMAL HIGH (ref 1.7–7.7)
Neutro Abs: 15.5 10*3/uL — ABNORMAL HIGH (ref 1.7–7.7)
Neutrophils Relative %: 76 % (ref 43–77)
Platelets: 152 10*3/uL (ref 150–400)
Platelets: 143 10*3/uL — ABNORMAL LOW (ref 150–400)
RBC: 4.78 MIL/uL (ref 4.22–5.81)
RBC: 4.46 MIL/uL (ref 4.22–5.81)
RDW: 14 % (ref 11.5–15.5)
WBC: 19.1 10*3/uL — ABNORMAL HIGH (ref 4.0–10.5)
WBC: 20.6 10*3/uL — ABNORMAL HIGH (ref 4.0–10.5)

## 2013-01-06 LAB — BASIC METABOLIC PANEL
CO2: 27 mEq/L (ref 19–32)
Calcium: 9.1 mg/dL (ref 8.4–10.5)
Chloride: 101 mEq/L (ref 96–112)
GFR calc Af Amer: 56 mL/min — ABNORMAL LOW (ref >90)
GFR calc non Af Amer: 49 mL/min — ABNORMAL LOW (ref >90)
Glucose, Bld: 100 mg/dL — ABNORMAL HIGH (ref 70–99)
Potassium: 3.5 mEq/L (ref 3.5–5.1)
Sodium: 138 mEq/L (ref 135–145)

## 2013-01-06 LAB — EXPECTORATED SPUTUM ASSESSMENT W GRAM STAIN, RFLX TO RESP C

## 2013-01-06 LAB — PROTIME-INR
INR: 1.77 — ABNORMAL HIGH (ref 0.00–1.49)
Prothrombin Time: 20.1 seconds — ABNORMAL HIGH (ref 11.6–15.2)

## 2013-01-06 MED ORDER — WARFARIN SODIUM 6 MG PO TABS
6.0000 mg | ORAL_TABLET | Freq: Once | ORAL | Status: AC
  Administered 2013-01-06: 6 mg via ORAL
  Filled 2013-01-06 (×2): qty 1

## 2013-01-06 MED ORDER — ALBUTEROL SULFATE (5 MG/ML) 0.5% IN NEBU
2.5000 mg | INHALATION_SOLUTION | RESPIRATORY_TRACT | Status: DC | PRN
Start: 2013-01-06 — End: 2013-01-09
  Administered 2013-01-08: 2.5 mg via RESPIRATORY_TRACT
  Filled 2013-01-06: qty 0.5

## 2013-01-06 MED ORDER — FUROSEMIDE 10 MG/ML IJ SOLN
INTRAMUSCULAR | Status: AC
  Filled 2013-01-06: qty 4

## 2013-01-06 MED ORDER — DEXTROSE 5 % IV SOLN
1.0000 g | Freq: Three times a day (TID) | INTRAVENOUS | Status: DC
  Administered 2013-01-06 – 2013-01-08 (×6): 1 g via INTRAVENOUS
  Filled 2013-01-06 (×12): qty 1

## 2013-01-06 MED ORDER — ACETAMINOPHEN 650 MG RE SUPP
650.0000 mg | RECTAL | Status: DC | PRN
Start: 2013-01-06 — End: 2013-01-09
  Administered 2013-01-06: 650 mg via RECTAL
  Filled 2013-01-06: qty 1

## 2013-01-06 MED ORDER — FUROSEMIDE 10 MG/ML IJ SOLN
40.0000 mg | Freq: Every day | INTRAMUSCULAR | Status: DC
  Administered 2013-01-07 – 2013-01-09 (×3): 40 mg via INTRAVENOUS
  Filled 2013-01-06 (×3): qty 4

## 2013-01-06 MED ORDER — FUROSEMIDE 10 MG/ML IJ SOLN
40.0000 mg | Freq: Once | INTRAMUSCULAR | Status: AC
  Administered 2013-01-06: 40 mg via INTRAVENOUS

## 2013-01-06 NOTE — Progress Notes (Signed)
Received patient very confused, oriented to self , disoriented to place, time and place, trying to get out of bed ,very anxious. Assisted back to bed, vital signs taken and recorded, O2 started at 4 LPM then to 100% non-rebreather. Md paged.

## 2013-01-06 NOTE — Evaluation (Signed)
Clinical/Bedside Swallow Evaluation Patient Details  Name: Glenn Barron MRN: [id number] Date of Birth: 02-02-1923  Today's Date: 01/06/2013 Time: 1530-1620 SLP Time Calculation (min): 50 min  Past Medical History:  Past Medical History  Diagnosis Date  . Allergic rhinitis, cause unspecified   . Unspecified chronic bronchitis   . Other pulmonary embolism and infarction   . Paroxysmal a-fib   . Cerebrovascular disease, unspecified 10/2002    right side weakness  . Peripheral vascular disease, unspecified   . Unspecified venous (peripheral) insufficiency   . Acute venous embolism and thrombosis of unspecified deep vessels of lower extremity   . Hyperlipidemia   . GERD (gastroesophageal reflux disease)   . Diverticulosis of colon (without mention of hemorrhage)   . Irritable bowel syndrome   . Benign neoplasm of colon   . Unspecified hereditary and idiopathic peripheral neuropathy   . Depression   . Right bundle branch block   . History of scarlet fever 1944  . Aortic stenosis, mild     mild by echo 04/2011  . Heart murmur   . Hypertension     Dr. Eden Emms cardiologist ; "haven't taken anything for a couple years since losing weight" (01/01/2013)  . DVT (deep venous thrombosis)     "put filter in to prevent" (01/01/2013)  . Asthma     "mild" (01/01/2013)  . Pneumonia     "a couple times" (01/01/2013)  . Exertional shortness of breath     "as I get older" (01/01/2013)  . Unspecified cerebral artery occlusion with cerebral infarction   . Stroke 09/2002    "right foot still drags" (01/01/2013)  . Osteoarthrosis, unspecified whether generalized or localized, unspecified site   . Arthritis     "both ankles" (01/01/2013)  . DDD (degenerative disc disease)   . Malignant neoplasm of prostate   . Skin cancer     "off my face" (01/01/2013)   Past Surgical History:  Past Surgical History  Procedure Laterality Date  . Prostatectomy    . Cataract extraction w/ intraocular lens   implant, bilateral Bilateral   . Carotid endarterectomy Right   . Abdominal aortic aneurysm repair    . Skin cancer excision      "left face and arms" (01/01/2013)  . Tonsillectomy and adenoidectomy    . Inguinal hernia repair Left 1991  . Inguinal hernia repair Right 1930's  . Soft tissue mass excision  1957    "off left arm and back" (01/01/2013)  . Vena cava filter placement Right    HPI:  79 yo male adm to [facility] for toe infection, during hospital course pt demonstrated respiratory issues requiring increased respiratory support and transfer to step down unit.  Pt has h/o allergies, pulmonary fibrosis per CXRs (pt denies), CVA with dysphagia.  Pt was disoriented earlier during hospital stay- ? contributing factor to pna, ? due to morphine and family reports speech was difficult to understand.  BSE ordered.  Pt CXR indicated multi-lobar pna.  Pt admits to issues with coughing up secretions for months which he attributes to sinus issues.  He does admit to issues with swallowing pills with them "coming back up" approx 2- 5 minutes after he takes them - occuring frequently over the last five months.  He also had pna in 1999 or 2000 when he had his aortic aneurysm.  In additiion pt has required to be patted on the back previously when choking during intake.  Suspect acute on chronic multifactorial dysphagia with oropharyngeal and  cervical esophageal issues.     Assessment / Plan / Recommendation Clinical Impression  Suspect acute on chronic multifactorial dysphagia with oropharyngeal and suspected cervical esophageal issues.  Pt reports coughing up V8 juice and whole pills over the last five months.  Complex med hx makes him increased aspiration risk including chronic sinus issues, pulmonary fibrosis, reflux issues, oropharyngeal dysphagia from 2004 CVA and Bell's palsy 2012 (CT head at that time negative-no MRI done).  Clinical s/s of aspiration across consistencies- even with chin tuck and multiple  swallows.  Wet vocal quality noted that pt was not consistently aware of-- indicating suspected silent aspiration.  Pt coughed up secretions included portion of orange juice consumed.  Pt current pna may be due to AMS with pain treatment- ? aspiration of secretions.  Highly suspect pt is experiencing chronic aspiration at least for the last five months with adequate tolerance when he is acutely ill.   Skilled intervention included long discussion with family/pt re concern for chronicity of dysphagia/aspiration and decreased tolerance when acutely ill.  SLP role may be do provide strategies to only mitigate aspiration - not prevent it in this pt with chronic dysphagia.      Aspiration Risk  Severe    Diet Recommendation NPO (except ice/needed medications)   Medication Administration: Crushed with puree    Other  Recommendations Recommended Consults: MBS   Follow Up Recommendations    TBD   Frequency and Duration min 1 x/week  1 week   Pertinent Vitals/Pain Afebrile, decreased    SLP Swallow Goals     Swallow Study Prior Functional Status   n/a    General Date of Onset: 01/06/13 HPI: 79 yo male adm to [facility] for toe infection, during hospital course pt demonstrated respiratory issues requiring increased respiratory support and transfer to step down unit.  Pt has h/o allergies, pulmonary fibrosis per CXRs (pt denies), CVA with dysphagia.  Pt was disoriented earlier during hospital stay- ? contributing factor to pna, ? due to morphine and family reports speech was difficult to understand.  BSE ordered.  Pt CXR indicated multi-lobar pna.  Pt admits to issues with coughing up secretions for months which he attributes to sinus issues.  He does admit to issues with swallowing pills with them "coming back up" approx 2- 5 minutes after he takes them - occuring frequently over the last five months.  He also had pna in 1999 or 2000 when he had his aortic aneurysm.  In additiion pt has required to be  patted on the back previously when choking during intake.  Suspect acute on chronic multifactorial dysphagia with oropharyngeal and cervical esophageal issues.   Type of Study: Bedside swallow evaluation Diet Prior to this Study: Regular;Thin liquids Temperature Spikes Noted: No Respiratory Status: Nasal cannula History of Recent Intubation: No Behavior/Cognition: Alert;Cooperative;Pleasant mood Oral Cavity - Dentition: Adequate natural dentition (xerostomic) Self-Feeding Abilities: Able to feed self Patient Positioning: Upright in bed Baseline Vocal Quality: Hoarse;Low vocal intensity;Breathy Volitional Cough: Congested (pt coughing intermittently even on secretions) Volitional Swallow: Able to elicit    Oral/Motor/Sensory Function Overall Oral Motor/Sensory Function: Appears within functional limits for tasks assessed (pt reports episode of Bell's Palsy impacting left face)   Ice Chips Ice chips: Impaired Presentation: Self Fed;Spoon Pharyngeal Phase Impairments: Wet Vocal Quality;Decreased hyoid-laryngeal movement;Throat Clearing - Immediate   Thin Liquid Thin Liquid: Impaired Presentation: Self Fed;Straw;Spoon Pharyngeal  Phase Impairments: Decreased hyoid-laryngeal movement;Multiple swallows;Wet Vocal Quality;Cough - Immediate    Nectar Thick Nectar Thick  Liquid: Impaired Pharyngeal Phase Impairments: Throat Clearing - Delayed;Cough - Delayed;Decreased hyoid-laryngeal movement;Unable to trigger swallow;Wet Vocal Quality   Honey Thick Honey Thick Liquid: Not tested   Puree Puree: Impaired Presentation: Self Fed;Spoon Pharyngeal Phase Impairments: Suspected delayed Swallow;Multiple swallows Other Comments: with chin tuck posture pt without symptoms with 75% of presentations   Solid   GO    Solid: Not tested      Donavan Burnet, MS [facility] SLP [phone]

## 2013-01-06 NOTE — Progress Notes (Signed)
[facility] PROGRESS NOTE  Glenn Barron NWG:[id number] DOB: December 02, 1922 DOA: 01/01/2013 PCP: Michele Mcalpine, MD  Assessment/Plan: 1. Acute respiratory failure with hypoxia: - pt had respiratory distress this am, with confusion. He was put on NRB, his ABG revealed respiratory alkalosis, . His CXR revealed multifocal pneumonia. His IV cipro was discontinued and IV cefepime added. ID consulted and will see the patient tomorrow. Blood cultures drawn as he was found to have low grade temp, his labs revealed leukocytosis. Sputum cultures ordered and urine for legionella and strepto ordered. He is currently on 6 lit [place] oxygen and he was transferred to step down for closer monitoring.    1. 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 Discontinued IV cipro to IV cefepime today when he was found to have multifocal pneumonia.  - orthopedics consulted.  - MRI Showed OM of the small toe.  - venous duplex showed chronic DVT. - vascular consulted and recommendations given . Will continue with IV antibiotics and conservative management, as healing of the toe amputation without revascularization will be very difficult, in view of his occluded popliteal artery. Discussed witht he patient and he agrees to a conservative approach and IV antibiotics.   2. Cellulitis and myositis of the RLE: - almost resolved.  - IV antibiotics on b oard.  - elevate leg.   3. Atrial fibrillation: - rate controlled.  - on chronic anticoagulation with therapeutic INR.   4. Hypertension:  Better controlled.   DVT prophylaxis.   Code Status: full code Family Communication: none at bedside, . Disposition Plan: SNF placement.    Consultants:  Orthopedics Vascular surgery.   Procedures:  MRI of the RIGHT leg  Venous duplex of the Lower extremity pending.   Antibiotics:  Vancomycin 11/25  Rocephin 11 25  one dose  Ciprofloxacin 11/26  HPI/Subjective: Pain improved. Comfortable, constipation resolved.   Objective: Filed Vitals:   01/06/13 1400  BP: 108/72  Pulse: 81  Temp:   Resp: 22    Intake/Output Summary (Last 24 hours) at 01/06/13 1549 Last data filed at 01/06/13 1021  Gross per 24 hour  Intake      3 ml  Output    900 ml  Net   -897 ml   Filed Weights   01/01/13 2037  Weight: 74.707 kg (164 lb 11.2 oz)    Exam:   General:  Alert afebrile comfortable, now.   Cardiovascular: s1s2  Respiratory: bilateral rhonchi.   Abdomen: soft NT ND BS+  Musculoskeletal: improved Cellulitic changes int he rightl ower extremity.   Data Reviewed: Basic Metabolic Panel:  Recent Labs Lab 01/01/13 1620 01/03/13 0355 01/06/13 1300  NA 136 138 138  K 4.5 3.9 3.5  CL 98 105 101  CO2 28 26 27   GLUCOSE 113* 93 100*  BUN 22 23 33*  CREATININE 1.23 1.07 1.27  CALCIUM 10.8* 9.0 9.1   Liver Function Tests: No results found for this basename: AST, ALT, ALKPHOS, BILITOT, PROT, ALBUMIN,  in the last 168 hours No results found for this basename: LIPASE, AMYLASE,  in the last 168 hours No results found for this basename: AMMONIA,  in the last 168 hours CBC:  Recent Labs Lab 01/01/13 1620 01/06/13 1300  WBC 8.5 19.1*  NEUTROABS 6.0 15.2*  HGB 14.4 15.4  HCT 43.7 46.2  MCV 96.3 96.7  PLT 160 152   Cardiac  Enzymes: No results found for this basename: CKTOTAL, CKMB, CKMBINDEX, TROPONINI,  in the last 168 hours BNP (last 3 results) No results found for this basename: PROBNP,  in the last 8760 hours CBG: No results found for this basename: GLUCAP,  in the last 168 hours  Recent Results (from the past 240 hour(s))  MRSA PCR SCREENING     Status: None   Collection Time    01/01/13 10:35 PM      Result Value Range Status   MRSA by PCR NEGATIVE  NEGATIVE Final   Comment:            The GeneXpert MRSA Assay (FDA     approved for NASAL specimens     only), is one  component of a     comprehensive MRSA colonization     surveillance program. It is not     intended to diagnose MRSA     infection nor to guide or     monitor treatment for     MRSA infections.  WOUND CULTURE     Status: None   Collection Time    01/02/13  2:42 PM      Result Value Range Status   Specimen Description WOUND RIGHT TOE   Final   Special Requests RT BABY TOE   Final   Gram Stain     Final   Value: RARE WBC PRESENT, PREDOMINANTLY PMN     NO SQUAMOUS EPITHELIAL CELLS SEEN     NO ORGANISMS SEEN     Performed at [facility]   Culture     Final   Value: FEW STAPHYLOCOCCUS SPECIES (COAGULASE NEGATIVE)     Performed at [facility]   Report Status 01/04/2013 FINAL   Final     Studies: Dg Chest Port 1 View  01/06/2013   CLINICAL DATA:  Respiratory distress  EXAM: PORTABLE CHEST - 1 VIEW  COMPARISON:  07/03/2012  FINDINGS: Multifocal patchy opacities, predominantly in the left upper and bilateral lower lobes, suspicious for pneumonia. Interstitial edema is considered less likely.  Linear scarring in the right lower lobe, chronic. No pleural effusion or pneumothorax.  The heart is normal in size.  IMPRESSION: Suspected multifocal pneumonia, left upper lobe predominant, as described above.   Electronically Signed   By: Charline Bills M.D.   On: 01/06/2013 08:20    Scheduled Meds: . acetaminophen  650 mg Oral BID  . aspirin EC  81 mg Oral Daily  . ceFEPime (MAXIPIME) IV  1 g Intravenous Q8H  . FLORA-Q  1 capsule Oral Daily  . fluticasone  1 spray Each Nare BID  . [START ON 01/07/2013] furosemide  40 mg Intravenous Daily  . mometasone-formoterol  2 puff Inhalation BID  . predniSONE  5 mg Oral Q48H  . senna-docusate  2 tablet Oral BID  . sodium chloride  3 mL Intravenous Q12H  . sodium chloride  3 mL Intravenous Q12H  . vancomycin  750 mg Intravenous Q12H  . warfarin  6 mg Oral ONCE-1800  . Warfarin - Pharmacist Dosing Inpatient   Does not apply q1800    Continuous Infusions:   Principal Problem:   Cellulitis Active Problems:   PROSTATE CANCER   HYPERLIPIDEMIA   HYPERTENSION   CONGESTIVE HEART FAILURE   CEREBROVASCULAR DISEASE   Edema   Aneurysm of artery of lower extremity   Atrial fibrillation   Cellulitis and abscess   Osteomyelitis of toe    Time spent: 20 min  [facility]  [facility] Pager [phone]. If 7PM-7AM, please contact night-coverage at [email or web address], password [facility] 01/06/2013, 3:49 PM  LOS: 5 days

## 2013-01-06 NOTE — Progress Notes (Signed)
Report given to Sam, RN [id number].

## 2013-01-06 NOTE — Progress Notes (Addendum)
ANTICOAGULATION/ANTIBIOTIC CONSULT NOTE - Follow Up Consult  Pharmacy Consult for Coumadin and Cefepime and Vancomycin Indication: h/o DVT and PE, afib and HCAP  Allergies  Allergen Reactions  . Doxycycline     Rash  . Amoxicillin Rash    Yeast infection     Patient Measurements: Height: 6\' 1"  (185.4 cm) Weight: 164 lb 11.2 oz (74.707 kg) IBW/kg (Calculated) : 79.9  Vital Signs: Temp: 98.2 F (36.8 C) (11/30 0627) Temp src: Oral (11/30 0627) BP: 132/84 mmHg (11/30 0627) Pulse Rate: 102 (11/30 0627)  Labs:  Recent Labs  01/04/13 0435 01/06/13 0700  LABPROT 22.5* 20.1*  INR 2.05* 1.77*    Estimated Creatinine Clearance: 50.4 ml/min (by C-G formula based on Cr of 1.07).   Assessment: AC: 79 y.o. male on coumadin for h/o DVT and PE, afib. INR trending down on home dose. No bleeding noted. Pt continues on Cipro which will potentiate the effects of warfarin.  ID: Pt on Vancomycin and Cipro Day #6 for R foot cellulitis. MRI shows osteo of the phalanges of the small toe. OM of small toe on R foot as well as cellulitis/myositis of RLE. Ortho cancelled amputation of R 5th toe due to high risk of nonhealing from poor circulation. MD wishes to change Cipro to Cefepime for HCAP coverage. CXR shows multifocal PNA. Afeb. Wbc wnl.  Vancomycin 11/25>     11/28 VT 19.8 on 750mg  IV q12h Cipro 11/25>11/30 Cefepime 11/30>  11/26 R toe wound>>few coag neg staph  Goal of Therapy:  Vancomycin trough 15-20 mcg/ml INR 2-3 Monitor platelets by anticoagulation protocol: Yes   Plan:  1. D/c scheduled coumadin. Coumadin 6mg  today 2. Daily INR 3. Cefepime 1gm IV q8h. 4. Continue Vancomycin 750mg  IV q12h. Weekly vancomycin trough 5. Will f/u micro data, renal function (BMET in a.m.)  Christoper Fabian, PharmD, BCPS Clinical pharmacist, pager [phone] 01/06/2013,8:24 AM

## 2013-01-06 NOTE — Progress Notes (Signed)
Son made aware of the transfer.

## 2013-01-06 NOTE — Consult Note (Signed)
[facility]  Total days of antibiotics 6        Day 1 cefepime        Day 6 vanco        (5 days of cipro)       Reason for Consult: osteo plus aspiration pneumonia    Referring Physician: Blake Divine  Principal Problem:   Cellulitis Active Problems:   PROSTATE CANCER   HYPERLIPIDEMIA   HYPERTENSION   CONGESTIVE HEART FAILURE   CEREBROVASCULAR DISEASE   Edema   Aneurysm of artery of lower extremity   Atrial fibrillation   Cellulitis and abscess   Osteomyelitis of toe   Acute respiratory failure    HPI: Glenn Barron is a 79 y.o. male  lives at home with round the clock help, hx of stroke with right sided weakness in 2004, PAF, prior PE and DVT on chronic anticoagulation, hx of PVD, mild AS, HTN, chronic lower extremity swelling, presents to the ER on 11/25 with increase swelling, redness, and discharge from his right foot. He has purulent discharge over the 5th toe along with increase pain. He has no fever, chills, nausea, vomiting or any acute systemic symptoms. Evalaution in the ER showed normal WBC, no fever, and normal renal fx. He was admitted for cellulitis, possible osteomyelitis. He was empirically on vancomycin and cipro. He states that his right foot and leg look much better since admit. Last night had episode of difficulty breathing, found to have acute worsening of respiratory status, cxr shows bilateral patchy infiltrates, temp to 100.3 and leukocytosis increased to 19. He was placed on NRB but then transitioned back to room air within the day. Respiratory insult that to be due to aspiration event, possible pneumonia. Antibiotics changed to vanco and cefepime. He also had MRI Osteomyelitis of the phalanges of the small toe. Suspected diffuse forefoot cellulitis most concentrated in the small toe. Potential myositis along the plantar musculature of the forefoot. No drainable abscess. ORtho suggested amputation but dr. Arbie Cookey wanted to do conservative  measures due to the patient's peripheral vascular disease. Interestingly sed rate 9 on admit.  From his remote history of his stroke, he did have to undergo speech therapy to help with swallowing function    Past Medical History  Diagnosis Date  . Allergic rhinitis, cause unspecified   . Unspecified chronic bronchitis   . Other pulmonary embolism and infarction   . Paroxysmal a-fib   . Cerebrovascular disease, unspecified 10/2002    right side weakness  . Peripheral vascular disease, unspecified   . Unspecified venous (peripheral) insufficiency   . Acute venous embolism and thrombosis of unspecified deep vessels of lower extremity   . Hyperlipidemia   . GERD (gastroesophageal reflux disease)   . Diverticulosis of colon (without mention of hemorrhage)   . Irritable bowel syndrome   . Benign neoplasm of colon   . Unspecified hereditary and idiopathic peripheral neuropathy   . Depression   . Right bundle branch block   . History of scarlet fever 1944  . Aortic stenosis, mild     mild by echo 04/2011  . Heart murmur   . Hypertension     Dr. Eden Emms cardiologist ; "haven't taken anything for a couple years since losing weight" (01/01/2013)  . DVT (deep venous thrombosis)     "put filter in to prevent" (01/01/2013)  . Asthma     "mild" (01/01/2013)  . Pneumonia     "a couple times" (01/01/2013)  .  Exertional shortness of breath     "as I get older" (01/01/2013)  . Unspecified cerebral artery occlusion with cerebral infarction   . Stroke 09/2002    "right foot still drags" (01/01/2013)  . Osteoarthrosis, unspecified whether generalized or localized, unspecified site   . Arthritis     "both ankles" (01/01/2013)  . DDD (degenerative disc disease)   . Malignant neoplasm of prostate   . Skin cancer     "off my face" (01/01/2013)    Allergies:  Allergies  Allergen Reactions  . Doxycycline     Rash  . Amoxicillin Rash    Yeast infection     MEDICATIONS: . acetaminophen   650 mg Oral BID  . aspirin EC  81 mg Oral Daily  . ceFEPime (MAXIPIME) IV  1 g Intravenous Q8H  . FLORA-Q  1 capsule Oral Daily  . fluticasone  1 spray Each Nare BID  . [START ON 01/07/2013] furosemide  40 mg Intravenous Daily  . mometasone-formoterol  2 puff Inhalation BID  . predniSONE  5 mg Oral Q48H  . senna-docusate  2 tablet Oral BID  . sodium chloride  3 mL Intravenous Q12H  . sodium chloride  3 mL Intravenous Q12H  . vancomycin  750 mg Intravenous Q12H  . warfarin  6 mg Oral ONCE-1800  . Warfarin - Pharmacist Dosing Inpatient   Does not apply q1800    History  Substance Use Topics  . Smoking status: Never Smoker   . Smokeless tobacco: Never Used  . Alcohol Use: Yes     Comment: 01/01/2013 "haven't had any alcohol since ~ 1999; never had a problem w/alcohol"    Family History  Problem Relation Age of Onset  . Anesthesia problems Neg Hx   . Heart attack Father   . Hyperlipidemia Mother   . Hypertension Mother   . Hypertension Sister      Review of Systems  Constitutional: positive for chills this morning. Negative for fever, chills, diaphoresis, activity change, appetite change, fatigue and unexpected weight change.  HENT: Negative for congestion, sore throat, rhinorrhea, sneezing, trouble swallowing and sinus pressure.  Eyes: Negative for photophobia and visual disturbance.  Respiratory: positive for dry cough, but chest tightness, shortness of breath, wheezing and stridor.  Cardiovascular: Negative for chest pain, palpitations and leg swelling.  Gastrointestinal: Negative for nausea, vomiting, abdominal pain, diarrhea, constipation, blood in stool, abdominal distention and anal bleeding.  Genitourinary: Negative for dysuria, hematuria, flank pain and difficulty urinating.  Musculoskeletal: Negative for myalgias, back pain, joint swelling, arthralgias and gait problem.  Skin: per hpi Neurological: Negative for dizziness, tremors, weakness and light-headedness.    Hematological: Negative for adenopathy. Does not bruise/bleed easily.  Psychiatric/Behavioral: Negative for behavioral problems, confusion, sleep disturbance, dysphoric mood, decreased concentration and agitation.    OBJECTIVE: Temp:  [97.8 F (36.6 C)-100.3 F (37.9 C)] 97.9 F (36.6 C) (11/30 1600) Pulse Rate:  [30-112] 75 (11/30 1600) Resp:  [18-24] 21 (11/30 1600) BP: (100-154)/(64-88) 121/88 mmHg (11/30 1600) SpO2:  [86 %-98 %] 98 % (11/30 1600) FiO2 (%):  [50 %-100 %] 50 % (11/30 0831) GEN:  alert and oriented x4; in AND; affect is appropriate.  HEENT: Mucous membranes pink and anicteric; PERRLA; EOM intact; no cervical lymphadenopathy nor thyromegaly or carotid bruit; no JVD; There were no stridor. Neck is very supple.  CHEST: normal respiration but course rhonchi bilaterally throughout HEART: Regular rate and rhythm. There are no murmur, rub, or gallops.   ABDOMEN: soft and non-tender;  no masses, no organomegaly, normal abdominal bowel sounds; no pannus; no intertriginous candida. There is no rebound and no distention.  EXTREMITIES: No bone or joint deformity; age-appropriate arthropathy of the hands and knees; 2+ edema; no ulcerations. There is no calf tenderness.  PULSES: 2+ and symmetric  SKIN: right foot  5th toe lateral shallow ulcer that is red. Blanching but limited to toe, not dorsum CNS: Cranial nerves 2-12 grossly intact  LABS: Results for orders placed during the hospital encounter of 01/01/13 (from the past 48 hour(s))  VANCOMYCIN, TROUGH     Status: None   Collection Time    01/04/13  9:20 PM      Result Value Range   Vancomycin Tr 19.8  10.0 - 20.0 ug/mL  PROTIME-INR     Status: Abnormal   Collection Time    01/06/13  7:00 AM      Result Value Range   Prothrombin Time 20.1 (*) 11.6 - 15.2 seconds   INR 1.77 (*) 0.00 - 1.49  BLOOD GAS, ARTERIAL     Status: Abnormal   Collection Time    01/06/13  8:25 AM      Result Value Range   FIO2 1.00     Delivery  systems OXYGEN MASK     pH, Arterial 7.491 (*) 7.350 - 7.450   pCO2 arterial 28.4 (*) 35.0 - 45.0 mmHg   pO2, Arterial 139.0 (*) 80.0 - 100.0 mmHg   Bicarbonate 21.6  20.0 - 24.0 mEq/L   TCO2 22.4  0 - 100 mmol/L   Acid-base deficit 1.4  0.0 - 2.0 mmol/L   O2 Saturation 99.4     Patient temperature 98.2     Collection site RIGHT RADIAL     Drawn by [phone]     Sample type ARTERIAL DRAW     Allens test (pass/fail) PASS  PASS  LEGIONELLA ANTIGEN, URINE     Status: None   Collection Time    01/06/13  8:55 AM      Result Value Range   Specimen Description URINE, CLEAN CATCH     Special Requests NONE     Legionella Antigen, Urine       Value: Negative for Legionella pneumophilia serogroup 1     Performed at [facility]   Report Status 01/06/2013 FINAL    URINALYSIS, ROUTINE W REFLEX MICROSCOPIC     Status: None   Collection Time    01/06/13  8:55 AM      Result Value Range   Color, Urine YELLOW  YELLOW   APPearance CLEAR  CLEAR   Specific Gravity, Urine 1.005  1.005 - 1.030   pH 6.5  5.0 - 8.0   Glucose, UA NEGATIVE  NEGATIVE mg/dL   Hgb urine dipstick NEGATIVE  NEGATIVE   Bilirubin Urine NEGATIVE  NEGATIVE   Ketones, ur NEGATIVE  NEGATIVE mg/dL   Protein, ur NEGATIVE  NEGATIVE mg/dL   Urobilinogen, UA 0.2  0.0 - 1.0 mg/dL   Nitrite NEGATIVE  NEGATIVE   Leukocytes, UA NEGATIVE  NEGATIVE   Comment: MICROSCOPIC NOT DONE ON URINES WITH NEGATIVE PROTEIN, BLOOD, LEUKOCYTES, NITRITE, OR GLUCOSE <1000 mg/dL.  CBC WITH DIFFERENTIAL     Status: Abnormal   Collection Time    01/06/13  1:00 PM      Result Value Range   WBC 19.1 (*) 4.0 - 10.5 K/uL   RBC 4.78  4.22 - 5.81 MIL/uL   Hemoglobin 15.4  13.0 - 17.0 g/dL  HCT 46.2  39.0 - 52.0 %   MCV 96.7  78.0 - 100.0 fL   MCH 32.2  26.0 - 34.0 pg   MCHC 33.3  30.0 - 36.0 g/dL   RDW 16.1  09.6 - 04.5 %   Platelets 152  150 - 400 K/uL   Neutrophils Relative % 80 (*) 43 - 77 %   Neutro Abs 15.2 (*) 1.7 - 7.7 K/uL   Lymphocytes  Relative 8 (*) 12 - 46 %   Lymphs Abs 1.6  0.7 - 4.0 K/uL   Monocytes Relative 12  3 - 12 %   Monocytes Absolute 2.3 (*) 0.1 - 1.0 K/uL   Eosinophils Relative 0  0 - 5 %   Eosinophils Absolute 0.0  0.0 - 0.7 K/uL   Basophils Relative 0  0 - 1 %   Basophils Absolute 0.0  0.0 - 0.1 K/uL  BASIC METABOLIC PANEL     Status: Abnormal   Collection Time    01/06/13  1:00 PM      Result Value Range   Sodium 138  135 - 145 mEq/L   Potassium 3.5  3.5 - 5.1 mEq/L   Chloride 101  96 - 112 mEq/L   CO2 27  19 - 32 mEq/L   Glucose, Bld 100 (*) 70 - 99 mg/dL   BUN 33 (*) 6 - 23 mg/dL   Creatinine, Ser 4.09  0.50 - 1.35 mg/dL   Calcium 9.1  8.4 - 81.1 mg/dL   GFR calc non Af Amer 49 (*) >90 mL/min   GFR calc Af Amer 56 (*) >90 mL/min   Comment: (NOTE)     The eGFR has been calculated using the CKD EPI equation.     This calculation has not been validated in all clinical situations.     eGFR's persistently <90 mL/min signify possible Chronic Kidney     Disease.  CULTURE, EXPECTORATED SPUTUM-ASSESSMENT     Status: None   Collection Time    01/06/13  3:44 PM      Result Value Range   Specimen Description SPUTUM     Special Requests NONE     Sputum evaluation       Value: THIS SPECIMEN IS ACCEPTABLE. RESPIRATORY CULTURE REPORT TO FOLLOW.   Report Status 01/06/2013 FINAL      MICRO: 11/30 blood cx PENDING 11/30 respiratory cx PENDING 11/26 wound cx few CoNS  IMAGING: Dg Chest Port 1 View  01/06/2013   CLINICAL DATA:  Respiratory distress  EXAM: PORTABLE CHEST - 1 VIEW  COMPARISON:  07/03/2012  FINDINGS: Multifocal patchy opacities, predominantly in the left upper and bilateral lower lobes, suspicious for pneumonia. Interstitial edema is considered less likely.  Linear scarring in the right lower lobe, chronic. No pleural effusion or pneumothorax.  The heart is normal in size.  IMPRESSION: Suspected multifocal pneumonia, left upper lobe predominant, as described above.   Electronically Signed    By: Charline Bills M.D.   On: 01/06/2013 08:20    Assessment/Plan:  79yo M with history of stroke, chairbound, has 5th digit pressure ulcer concerning for osteomyelitis on MRI. Hospitalization complicated by aspiration pneumonitis. Currently on vanco and cefepime.   - continue on vancomycin and cefepime for now for deep tissue infection/osteomyelitis. Can likely find an easier antibiotic regimen to give for discharge  - aspiration pneumonitis - patient appears much improved that reported this morning. Agree with dr. Darci Needle plan for swallow evaluation.   Dr. Ninetta Lights to provider further  recs on Monday  Glenn Valdivia B. Drue Second MD MPH [facility] [phone]

## 2013-01-07 ENCOUNTER — Inpatient Hospital Stay (HOSPITAL_COMMUNITY): Payer: Medicare Other

## 2013-01-07 DIAGNOSIS — Z87820 Personal history of traumatic brain injury: Secondary | ICD-10-CM

## 2013-01-07 DIAGNOSIS — J189 Pneumonia, unspecified organism: Secondary | ICD-10-CM

## 2013-01-07 DIAGNOSIS — J690 Pneumonitis due to inhalation of food and vomit: Secondary | ICD-10-CM | POA: Diagnosis not present

## 2013-01-07 DIAGNOSIS — B954 Other streptococcus as the cause of diseases classified elsewhere: Secondary | ICD-10-CM

## 2013-01-07 DIAGNOSIS — I69959 Hemiplegia and hemiparesis following unspecified cerebrovascular disease affecting unspecified side: Secondary | ICD-10-CM

## 2013-01-07 LAB — BASIC METABOLIC PANEL
CO2: 28 mEq/L (ref 19–32)
Chloride: 104 mEq/L (ref 96–112)
Creatinine, Ser: 1.11 mg/dL (ref 0.50–1.35)
GFR calc Af Amer: 66 mL/min — ABNORMAL LOW (ref >90)
Potassium: 3.5 mEq/L (ref 3.5–5.1)
Sodium: 139 mEq/L (ref 135–145)

## 2013-01-07 LAB — PROTIME-INR
INR: 1.94 — ABNORMAL HIGH (ref 0.00–1.49)
Prothrombin Time: 21.6 seconds — ABNORMAL HIGH (ref 11.6–15.2)

## 2013-01-07 MED ORDER — WARFARIN SODIUM 6 MG PO TABS
6.0000 mg | ORAL_TABLET | Freq: Once | ORAL | Status: AC
  Administered 2013-01-07: 6 mg via ORAL
  Filled 2013-01-07: qty 1

## 2013-01-07 MED ORDER — SODIUM CHLORIDE 0.9 % IV SOLN
INTRAVENOUS | Status: DC
  Administered 2013-01-08: 16:00:00 via INTRAVENOUS

## 2013-01-07 MED ORDER — GELATIN ABSORBABLE 12-7 MM EX MISC
1.0000 | Freq: Once | CUTANEOUS | Status: AC
  Administered 2013-01-07: 1 via TOPICAL

## 2013-01-07 MED ORDER — STARCH (THICKENING) PO POWD
ORAL | Status: DC | PRN
  Filled 2013-01-07: qty 227

## 2013-01-07 MED ORDER — SODIUM CHLORIDE 0.9 % IJ SOLN
10.0000 mL | INTRAMUSCULAR | Status: DC | PRN
  Administered 2013-01-09 (×2): 10 mL

## 2013-01-07 MED ORDER — SODIUM CHLORIDE 0.9 % IJ SOLN
10.0000 mL | Freq: Two times a day (BID) | INTRAMUSCULAR | Status: DC
  Administered 2013-01-07: 10 mL

## 2013-01-07 NOTE — Progress Notes (Signed)
[facility] PROGRESS NOTE  Glenn Barron YNW:[id number] DOB: 04/17/1922 DOA: 01/01/2013 PCP: Michele Mcalpine, MD  Assessment/Plan: 1. Acute respiratory failure with hypoxia: - pt had respiratory distress this am, with confusion. He was put on NRB, his ABG revealed respiratory alkalosis, . His CXR revealed multifocal pneumonia. His IV cipro was discontinued and IV cefepime added. ID consulted and will see the patient tomorrow. Blood cultures drawn as he was found to have low grade temp, his labs revealed leukocytosis. Sputum cultures ordered and urine for legionella and strepto ordered. He is currently on 6 lit [place] oxygen and he was transferred to step down for closer monitoring on 11/30. He is currently on 3 lit [place] oxygen, SLP evaluation was done and MBS Done on 12/1 showed moderate pharyngeal phase dysphagia, recommended dysphagia 3 diet.  ID consulted and recommendations given.    1. 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 Discontinued IV cipro to IV cefepime was added on 11/30 when he was found to have multifocal pneumonia.  - orthopedics consulted and recommendations given.  - venous duplex showed chronic DVT. - vascular consulted and recommendations given . Will continue with IV antibiotics and conservative management, as healing of the toe amputation without revascularization will be very difficult, in view of his occluded popliteal artery. Discussed witht he patient and he agrees to a conservative approach and IV antibiotics.   2. Cellulitis and myositis of the RLE: - almost resolved.  - IV antibiotics on b oard.  - elevate leg.   3. Atrial fibrillation: - rate controlled.  - on chronic anticoagulation with subtherapeutic INR.   4. Hypertension:  Better controlled.   DVT prophylaxis.   Code Status: full code Family Communication: none at bedside, . Disposition Plan:  SNF placement.    Consultants:  Orthopedics Vascular surgery.   Procedures:  MRI of the RIGHT leg  Venous duplex of the Lower extremity pending.   Antibiotics:  Vancomycin 11/25  Rocephin 11 25 one dose  Ciprofloxacin 11/26  HPI/Subjective: Pain improved. Comfortable, no new complaints. In good spirits.   Objective: Filed Vitals:   01/07/13 1257  BP: 128/71  Pulse: 76  Temp: 98.2 F (36.8 C)  Resp: 19    Intake/Output Summary (Last 24 hours) at 01/07/13 1435 Last data filed at 01/07/13 0900  Gross per 24 hour  Intake    490 ml  Output    500 ml  Net    -10 ml   Filed Weights   01/01/13 2037  Weight: 74.707 kg (164 lb 11.2 oz)    Exam:   General:  Alert afebrile comfortable, now.   Cardiovascular: s1s2  Respiratory: bilateral rhonchi.   Abdomen: soft NT ND BS+  Musculoskeletal: improved Cellulitic changes int he rightl ower extremity.   Data Reviewed: Basic Metabolic Panel:  Recent Labs Lab 01/01/13 1620 01/03/13 0355 01/06/13 1300 01/07/13 0335  NA 136 138 138 139  K 4.5 3.9 3.5 3.5  CL 98 105 101 104  CO2 28 26 27 28   GLUCOSE 113* 93 100* 101*  BUN 22 23 33* 37*  CREATININE 1.23 1.07 1.27 1.11  CALCIUM 10.8* 9.0 9.1 8.9   Liver Function Tests: No results found for this basename: AST, ALT, ALKPHOS, BILITOT, PROT, ALBUMIN,  in the last 168 hours No results found for this basename: LIPASE, AMYLASE,  in the last 168 hours No results  found for this basename: AMMONIA,  in the last 168 hours CBC:  Recent Labs Lab 01/01/13 1620 01/06/13 1300 01/06/13 1800  WBC 8.5 19.1* 20.6*  NEUTROABS 6.0 15.2* 15.5*  HGB 14.4 15.4 14.2  HCT 43.7 46.2 43.1  MCV 96.3 96.7 96.6  PLT 160 152 143*   Cardiac Enzymes: No results found for this basename: CKTOTAL, CKMB, CKMBINDEX, TROPONINI,  in the last 168 hours BNP (last 3 results) No results found for this basename: PROBNP,  in the last 8760 hours CBG: No results found for this basename:  GLUCAP,  in the last 168 hours  Recent Results (from the past 240 hour(s))  MRSA PCR SCREENING     Status: None   Collection Time    01/01/13 10:35 PM      Result Value Range Status   MRSA by PCR NEGATIVE  NEGATIVE Final   Comment:            The GeneXpert MRSA Assay (FDA     approved for NASAL specimens     only), is one component of a     comprehensive MRSA colonization     surveillance program. It is not     intended to diagnose MRSA     infection nor to guide or     monitor treatment for     MRSA infections.  WOUND CULTURE     Status: None   Collection Time    01/02/13  2:42 PM      Result Value Range Status   Specimen Description WOUND RIGHT TOE   Final   Special Requests RT BABY TOE   Final   Gram Stain     Final   Value: RARE WBC PRESENT, PREDOMINANTLY PMN     NO SQUAMOUS EPITHELIAL CELLS SEEN     NO ORGANISMS SEEN     Performed at [facility]   Culture     Final   Value: FEW STAPHYLOCOCCUS SPECIES (COAGULASE NEGATIVE)     Performed at [facility]   Report Status 01/04/2013 FINAL   Final  CULTURE, BLOOD (ROUTINE X 2)     Status: None   Collection Time    01/06/13  1:00 PM      Result Value Range Status   Specimen Description BLOOD LEFT ANTECUBITAL   Final   Special Requests BOTTLES DRAWN AEROBIC AND ANAEROBIC [facility]   Final   Culture  Setup Time     Final   Value: 01/06/2013 17:01     Performed at [facility]   Culture     Final   Value:        BLOOD CULTURE RECEIVED NO GROWTH TO DATE CULTURE WILL BE HELD FOR 5 DAYS BEFORE ISSUING A FINAL NEGATIVE REPORT     Performed at [facility]   Report Status PENDING   Incomplete  CULTURE, BLOOD (ROUTINE X 2)     Status: None   Collection Time    01/06/13  1:09 PM      Result Value Range Status   Specimen Description BLOOD LEFT HAND   Final   Special Requests BOTTLES DRAWN AEROBIC AND ANAEROBIC 5CC   Final   Culture  Setup Time     Final   Value: 01/06/2013 17:00     Performed at  [facility]   Culture     Final   Value:        BLOOD CULTURE RECEIVED NO GROWTH TO DATE CULTURE  WILL BE HELD FOR 5 DAYS BEFORE ISSUING A FINAL NEGATIVE REPORT     Performed at [facility]   Report Status PENDING   Incomplete  CULTURE, EXPECTORATED SPUTUM-ASSESSMENT     Status: None   Collection Time    01/06/13  3:44 PM      Result Value Range Status   Specimen Description SPUTUM   Final   Special Requests NONE   Final   Sputum evaluation     Final   Value: THIS SPECIMEN IS ACCEPTABLE. RESPIRATORY CULTURE REPORT TO FOLLOW.   Report Status 01/06/2013 FINAL   Final  CULTURE, RESPIRATORY (NON-EXPECTORATED)     Status: None   Collection Time    01/06/13  3:44 PM      Result Value Range Status   Specimen Description SPUTUM   Final   Special Requests NONE   Final   Gram Stain     Final   Value: MODERATE WBC PRESENT, PREDOMINANTLY PMN     FEW SQUAMOUS EPITHELIAL CELLS PRESENT     MODERATE GRAM VARIABLE ROD     MODERATE GRAM POSITIVE COCCI IN PAIRS     Performed at [facility]   Culture PENDING   Incomplete   Report Status PENDING   Incomplete     Studies: Dg Chest 2 View  01/07/2013   CLINICAL DATA:  Cough, congestion.  EXAM: CHEST  2 VIEW  COMPARISON:  01/06/2013.  FINDINGS: There is left lower lobe hazy airspace opacity. There is right lower lobe focal airspace disease. There may be a trace right pleural effusion. There is no pneumothorax. Stable cardiomediastinal silhouette. There are bilateral glenohumeral osteoarthritic changes.  IMPRESSION: Bilateral lower lobe airspace disease concerning for multifocal pneumonia.   Electronically Signed   By: Elige Ko   On: 01/07/2013 13:56   Dg Chest Port 1 View  01/06/2013   CLINICAL DATA:  Respiratory distress  EXAM: PORTABLE CHEST - 1 VIEW  COMPARISON:  07/03/2012  FINDINGS: Multifocal patchy opacities, predominantly in the left upper and bilateral lower lobes, suspicious for pneumonia. Interstitial edema is  considered less likely.  Linear scarring in the right lower lobe, chronic. No pleural effusion or pneumothorax.  The heart is normal in size.  IMPRESSION: Suspected multifocal pneumonia, left upper lobe predominant, as described above.   Electronically Signed   By: Charline Bills M.D.   On: 01/06/2013 08:20   Dg Swallowing Func-speech Pathology  01/07/2013   Vivi Ferns McCoy, CCC-SLP     01/07/2013  1:06 PM Objective Swallowing Evaluation: Modified Barium Swallowing Study   Patient Details  Name: Glenn Barron MRN: [id number] Date of Birth: 1922-07-12  Today's Date: 01/07/2013 Time: 1051-1130 SLP Time Calculation (min): 39 min  Past Medical History:  Past Medical History  Diagnosis Date  . Allergic rhinitis, cause unspecified   . Unspecified chronic bronchitis   . Other pulmonary embolism and infarction   . Paroxysmal a-fib   . Cerebrovascular disease, unspecified 10/2002    right side weakness  . Peripheral vascular disease, unspecified   . Unspecified venous (peripheral) insufficiency   . Acute venous embolism and thrombosis of unspecified deep  vessels of lower extremity   . Hyperlipidemia   . GERD (gastroesophageal reflux disease)   . Diverticulosis of colon (without mention of hemorrhage)   . Irritable bowel syndrome   . Benign neoplasm of colon   . Unspecified hereditary and idiopathic peripheral neuropathy   . Depression   . Right bundle branch block   .  History of scarlet fever 1944  . Aortic stenosis, mild     mild by echo 04/2011  . Heart murmur   . Hypertension     Dr. Eden Emms cardiologist ; "haven't taken anything for a couple  years since losing weight" (01/01/2013)  . DVT (deep venous thrombosis)     "put filter in to prevent" (01/01/2013)  . Asthma     "mild" (01/01/2013)  . Pneumonia     "a couple times" (01/01/2013)  . Exertional shortness of breath     "as I get older" (01/01/2013)  . Unspecified cerebral artery occlusion with cerebral infarction    . Stroke 09/2002    "right foot still drags"  (01/01/2013)  . Osteoarthrosis, unspecified whether generalized or localized,  unspecified site   . Arthritis     "both ankles" (01/01/2013)  . DDD (degenerative disc disease)   . Malignant neoplasm of prostate   . Skin cancer     "off my face" (01/01/2013)   Past Surgical History:  Past Surgical History  Procedure Laterality Date  . Prostatectomy    . Cataract extraction w/ intraocular lens  implant, bilateral  Bilateral   . Carotid endarterectomy Right   . Abdominal aortic aneurysm repair    . Skin cancer excision      "left face and arms" (01/01/2013)  . Tonsillectomy and adenoidectomy    . Inguinal hernia repair Left 1991  . Inguinal hernia repair Right 1930's  . Soft tissue mass excision  1957    "off left arm and back" (01/01/2013)  . Vena cava filter placement Right    HPI:  78 yo male adm to [facility] for toe infection, during hospital course  pt demonstrated respiratory issues requiring increased  respiratory support and transfer to step down unit.  Pt has h/o  allergies, pulmonary fibrosis per CXRs (pt denies), CVA with  dysphagia.  Pt was disoriented earlier during hospital stay- ?  contributing factor to pna, ? due to morphine and family reports  speech was difficult to understand.  BSE ordered.  Pt CXR  indicated multi-lobar pna.  Pt admits to issues with coughing up  secretions for months which he attributes to sinus issues.  He  does admit to issues with swallowing pills with them "coming back  up" approx 2- 5 minutes after he takes them - occuring frequently  over the last five months.  He also had pna in 1999 or 2000 when  he had his aortic aneurysm.  In additiion pt has required to be  patted on the back previously when choking during intake.   Suspect acute on chronic multifactorial dysphagia with  oropharyngeal and cervical esophageal issues.       Assessment / Plan / Recommendation Clinical Impression  Dysphagia Diagnosis: Moderate pharyngeal phase dysphagia;Severe  pharyngeal phase dysphagia;Mild  cervical esophageal phase  dysphagia;Moderate cervical esophageal phase dysphagia Clinical impression: Patient presents with a moderate-severe  pharyngeal and cervical esophageal dysphagia characterized by  decreased laryngeal and pharyngeal strength combined with  prominent appearing CP and questionable lower esophageal deficits  resulting in decreased airway closure during the swallow and  moderate-severe pharyngeal residuals post swallowing leading to  eventual aspiration of all liquids consistencies tested.  Clinician cueing for chin tuck successful at decreasing degree of  residuals however does not fully prevent aspiration. Cues for  hard cough and dry swallow moderately successful at clearing the  airway. Suspect that dysphagia chronic in nature based on  history. Discussed with patient in  length who wishes to continue  on a po diet with known risk. Dysphagia 3 (mechanical soft) diet  with nectar thick liquids and use of compensatory strategies  recommended as diet which will mitigate risk of aspiration at  this time. SLP will f/u for education on use of strategies.     Treatment Recommendation  Therapy as outlined in treatment plan below    Diet Recommendation Dysphagia 3 (Mechanical Soft);Nectar-thick  liquid   Liquid Administration via: Cup;No straw Medication Administration: Whole meds with puree Supervision: Patient able to self feed;Full supervision/cueing  for compensatory strategies Compensations: Slow rate;Small sips/bites;Hard cough after  swallow;Multiple dry swallows after each bite/sip Postural Changes and/or Swallow Maneuvers: Seated upright 90  degrees;Upright 30-60 min after meal    Other  Recommendations Oral Care Recommendations: Oral care BID Other Recommendations: Order thickener from pharmacy;Remove water  pitcher;Prohibited food (jello, ice cream, thin soups)   Follow Up Recommendations  Home health SLP    Frequency and Duration min 2x/week  1 week        General Date of Onset: 01/06/13  HPI: 79 yo male adm to [facility] for toe infection, during hospital  course pt demonstrated respiratory issues requiring increased  respiratory support and transfer to step down unit.  Pt has h/o  allergies, pulmonary fibrosis per CXRs (pt denies), CVA with  dysphagia.  Pt was disoriented earlier during hospital stay- ?  contributing factor to pna, ? due to morphine and family reports  speech was difficult to understand.  BSE ordered.  Pt CXR  indicated multi-lobar pna.  Pt admits to issues with coughing up  secretions for months which he attributes to sinus issues.  He  does admit to issues with swallowing pills with them "coming back  up" approx 2- 5 minutes after he takes them - occuring frequently  over the last five months.  He also had pna in 1999 or 2000 when  he had his aortic aneurysm.  In additiion pt has required to be  patted on the back previously when choking during intake.   Suspect acute on chronic multifactorial dysphagia with  oropharyngeal and cervical esophageal issues.   Type of Study: Modified Barium Swallowing Study Reason for Referral: Objectively evaluate swallowing function Previous Swallow Assessment: see HPI Diet Prior to this Study: NPO Temperature Spikes Noted: No Respiratory Status: Nasal cannula History of Recent Intubation: No Behavior/Cognition: Alert;Cooperative;Pleasant mood Oral Cavity - Dentition: Adequate natural dentition Oral Motor / Sensory Function: Within functional limits Self-Feeding Abilities: Able to feed self Patient Positioning: Upright in chair Baseline Vocal Quality: Hoarse Volitional Cough: Congested Volitional Swallow: Able to elicit Anatomy: Other (Comment) (anterior curvature of cervical spine) Pharyngeal Secretions: Not observed secondary MBS    Reason for Referral Objectively evaluate swallowing function   Oral Phase Oral Preparation/Oral Phase Oral Phase: WFL   Pharyngeal Phase Pharyngeal Phase Pharyngeal Phase: Impaired Pharyngeal - Honey Pharyngeal - Honey Cup:  Delayed swallow initiation;Premature  spillage to valleculae;Reduced epiglottic inversion;Reduced  pharyngeal peristalsis;Reduced anterior laryngeal  mobility;Reduced laryngeal elevation;Reduced airway/laryngeal  closure;Reduced tongue base retraction;Penetration/Aspiration  during swallow;Penetration/Aspiration after swallow;Moderate  aspiration;Pharyngeal residue - valleculae;Pharyngeal residue -  pyriform sinuses Penetration/Aspiration details (honey cup): Material enters  airway, passes BELOW cords without attempt by patient to eject  out (silent aspiration) Pharyngeal - Nectar Pharyngeal - Nectar Cup: Delayed swallow initiation;Premature  spillage to valleculae;Reduced epiglottic inversion;Reduced  pharyngeal peristalsis;Reduced anterior laryngeal  mobility;Reduced laryngeal elevation;Reduced airway/laryngeal  closure;Reduced tongue base retraction;Penetration/Aspiration  during swallow;Penetration/Aspiration after swallow;Moderate  aspiration;Pharyngeal residue -  valleculae;Pharyngeal residue -  pyriform sinuses Penetration/Aspiration details (nectar cup): Material enters  airway, passes BELOW cords without attempt by patient to eject  out (silent aspiration) Pharyngeal - Thin Pharyngeal - Thin Cup: Delayed swallow initiation;Premature  spillage to valleculae;Reduced epiglottic inversion;Reduced  pharyngeal peristalsis;Reduced anterior laryngeal  mobility;Reduced laryngeal elevation;Reduced airway/laryngeal  closure;Reduced tongue base retraction;Penetration/Aspiration  during swallow;Penetration/Aspiration after swallow;Moderate  aspiration;Pharyngeal residue - valleculae;Pharyngeal residue -  pyriform sinuses Penetration/Aspiration details (thin cup): Material enters  airway, passes BELOW cords without attempt by patient to eject  out (silent aspiration) Pharyngeal - Solids Pharyngeal - Puree: Delayed swallow initiation;Premature spillage  to valleculae;Reduced epiglottic inversion;Reduced pharyngeal   peristalsis;Reduced anterior laryngeal mobility;Reduced laryngeal  elevation;Reduced airway/laryngeal closure;Reduced tongue base  retraction;Penetration/Aspiration after swallow;Pharyngeal  residue - valleculae;Pharyngeal residue - pyriform sinuses Penetration/Aspiration details (puree): Material enters airway,  remains ABOVE vocal cords and not ejected out Pharyngeal - Mechanical Soft: Delayed swallow  initiation;Premature spillage to valleculae;Reduced epiglottic  inversion;Reduced pharyngeal peristalsis;Reduced anterior  laryngeal mobility;Reduced laryngeal elevation;Reduced  airway/laryngeal closure;Reduced tongue base  retraction;Pharyngeal residue - valleculae;Pharyngeal residue -  pyriform sinuses Penetration/Aspiration details (mechanical soft): Material does  not enter airway Pharyngeal - Pill: Delayed swallow initiation;Premature spillage  to valleculae;Reduced anterior laryngeal mobility;Reduced  epiglottic inversion;Reduced pharyngeal peristalsis;Reduced  laryngeal elevation;Reduced airway/laryngeal closure;Reduced  tongue base retraction;Pharyngeal residue - valleculae;Pharyngeal  residue - pyriform sinuses Pharyngeal Phase - Comment Pharyngeal Comment: provided whole in pureed solids  Cervical Esophageal Phase    GO   Leah McCoy MA, CCC-SLP [phone]  Cervical Esophageal Phase Cervical Esophageal Phase: Impaired (prominent appearing CP)         McCoy Leah Meryl 01/07/2013, 1:06 PM     Scheduled Meds: . acetaminophen  650 mg Oral BID  . aspirin EC  81 mg Oral Daily  . ceFEPime (MAXIPIME) IV  1 g Intravenous Q8H  . FLORA-Q  1 capsule Oral Daily  . fluticasone  1 spray Each Nare BID  . furosemide  40 mg Intravenous Daily  . mometasone-formoterol  2 puff Inhalation BID  . predniSONE  5 mg Oral Q48H  . senna-docusate  2 tablet Oral BID  . sodium chloride  3 mL Intravenous Q12H  . sodium chloride  3 mL Intravenous Q12H  . vancomycin  750 mg Intravenous Q12H  . warfarin  6 mg Oral  ONCE-1800  . Warfarin - Pharmacist Dosing Inpatient   Does not apply q1800   Continuous Infusions: . sodium chloride      Principal Problem:   Cellulitis Active Problems:   PROSTATE CANCER   HYPERLIPIDEMIA   HYPERTENSION   CONGESTIVE HEART FAILURE   CEREBROVASCULAR DISEASE   Edema   Aneurysm of artery of lower extremity   Atrial fibrillation   Cellulitis and abscess   Osteomyelitis of toe   Acute respiratory failure    Time spent: 20 min    Javed Cotto  [facility] Pager [phone]. If 7PM-7AM, please contact night-coverage at [email or web address], password [facility] 01/07/2013, 2:35 PM  LOS: 6 days

## 2013-01-07 NOTE — Progress Notes (Signed)
ANTICOAGULATION CONSULT NOTE - Follow Up Consult  Pharmacy Consult for Coumadin and Cefepime and Vancomycin Indication: h/o DVT and PE, afib   Allergies  Allergen Reactions  . Doxycycline     Rash  . Amoxicillin Rash    Yeast infection     Labs:  Recent Labs  01/06/13 0700 01/06/13 1300 01/06/13 1800 01/07/13 0335  HGB  --  15.4 14.2  --   HCT  --  46.2 43.1  --   PLT  --  152 143*  --   LABPROT 20.1*  --   --  21.6*  INR 1.77*  --   --  1.94*  CREATININE  --  1.27  --  1.11    Estimated Creatinine Clearance: 48.6 ml/min (by C-G formula based on Cr of 1.11).   Assessment: AC: 79 y.o. male on coumadin for h/o DVT and PE, afib. INR = 1.94 today  Goal of Therapy: INR 2-3 Monitor platelets by anticoagulation protocol: Yes   Plan:  1. Coumadin 6 mg po x 1 dose at 1800 pm 2. Daily INR  Thank you. Okey Regal, PharmD [phone]  01/07/2013,9:10 AM

## 2013-01-07 NOTE — Progress Notes (Signed)
Peripherally Inserted Central Catheter/Midline Placement  The IV Nurse has discussed with the patient and/or persons authorized to consent for the patient, the purpose of this procedure and the potential benefits and risks involved with this procedure.  The benefits include less needle sticks, lab draws from the catheter and patient may be discharged home with the catheter.  Risks include, but not limited to, infection, bleeding, blood clot (thrombus formation), and puncture of an artery; nerve damage and irregular heat beat.  Alternatives to this procedure were also discussed. Placed by Gasper Lloyd RN  PICC/Midline Placement Documentation  PICC / Midline Double Lumen 01/07/13 PICC Right Basilic 39 cm 0 cm (Active)       Netta Corrigan L 01/07/2013, 7:45 PM

## 2013-01-07 NOTE — Progress Notes (Signed)
INFECTIOUS DISEASE PROGRESS NOTE  ID: Glenn Barron is a 79 y.o. male with  Principal Problem:   Cellulitis Active Problems:   PROSTATE CANCER   HYPERLIPIDEMIA   HYPERTENSION   CONGESTIVE HEART FAILURE   CEREBROVASCULAR DISEASE   Edema   Aneurysm of artery of lower extremity   Atrial fibrillation   Cellulitis and abscess   Osteomyelitis of toe   Acute respiratory failure  Subjective: C/o gluteal pain.  No SOB.  RLE is better- less erythema, swelling.   Abtx:  Anti-infectives   Start     Dose/Rate Route Frequency Ordered Stop   01/06/13 1000  ceFEPIme (MAXIPIME) 1 g in dextrose 5 % 50 mL IVPB     1 g 100 mL/hr over 30 Minutes Intravenous 3 times per day 01/06/13 0842     01/02/13 2030  ciprofloxacin (CIPRO) IVPB 400 mg  Status:  Discontinued     400 mg 200 mL/hr over 60 Minutes Intravenous Every 24 hours 01/01/13 2038 01/06/13 0831   01/02/13 0900  vancomycin (VANCOCIN) IVPB 750 mg/150 ml premix     750 mg 150 mL/hr over 60 Minutes Intravenous Every 12 hours 01/01/13 2038     01/01/13 2030  ciprofloxacin (CIPRO) IVPB 400 mg  Status:  Discontinued     400 mg 200 mL/hr over 60 Minutes Intravenous Every 12 hours 01/01/13 2021 01/01/13 2038   01/01/13 1915  cefTRIAXone (ROCEPHIN) 1 g in dextrose 5 % 50 mL IVPB     1 g 100 mL/hr over 30 Minutes Intravenous  Once 01/01/13 1905 01/01/13 2000   01/01/13 1915  vancomycin (VANCOCIN) IVPB 1000 mg/200 mL premix     1,000 mg 200 mL/hr over 60 Minutes Intravenous  Once 01/01/13 1905 01/01/13 2107      Medications:  Scheduled: . acetaminophen  650 mg Oral BID  . aspirin EC  81 mg Oral Daily  . ceFEPime (MAXIPIME) IV  1 g Intravenous Q8H  . FLORA-Q  1 capsule Oral Daily  . fluticasone  1 spray Each Nare BID  . furosemide  40 mg Intravenous Daily  . mometasone-formoterol  2 puff Inhalation BID  . predniSONE  5 mg Oral Q48H  . senna-docusate  2 tablet Oral BID  . sodium chloride  3 mL Intravenous Q12H  . sodium chloride   3 mL Intravenous Q12H  . vancomycin  750 mg Intravenous Q12H  . warfarin  6 mg Oral ONCE-1800  . Warfarin - Pharmacist Dosing Inpatient   Does not apply q1800    Objective: Vital signs in last 24 hours: Temp:  [97.1 F (36.2 C)-98.3 F (36.8 C)] 97.5 F (36.4 C) (12/01 0753) Pulse Rate:  [30-99] 72 (12/01 0753) Resp:  [15-22] 21 (12/01 0753) BP: (89-121)/(42-88) 110/85 mmHg (12/01 0753) SpO2:  [91 %-100 %] 100 % (12/01 0912)   General appearance: alert, cooperative and no distress Resp: clear to auscultation bilaterally Cardio: regular rate and rhythm GI: normal findings: bowel sounds normal and soft, non-tender Extremities: edema trace RLE and R 5th toe has thickened callus, no d/c. no increase in heat.   Lab Results  Recent Labs  01/06/13 1300 01/06/13 1800 01/07/13 0335  WBC 19.1* 20.6*  --   HGB 15.4 14.2  --   HCT 46.2 43.1  --   NA 138  --  139  K 3.5  --  3.5  CL 101  --  104  CO2 27  --  28  BUN 33*  --  37*  CREATININE 1.27  --  1.11   Liver Panel No results found for this basename: PROT, ALBUMIN, AST, ALT, ALKPHOS, BILITOT, BILIDIR, IBILI,  in the last 72 hours Sedimentation Rate No results found for this basename: ESRSEDRATE,  in the last 72 hours C-Reactive Protein No results found for this basename: CRP,  in the last 72 hours  Microbiology: Recent Results (from the past 240 hour(s))  MRSA PCR SCREENING     Status: None   Collection Time    01/01/13 10:35 PM      Result Value Range Status   MRSA by PCR NEGATIVE  NEGATIVE Final   Comment:            The GeneXpert MRSA Assay (FDA     approved for NASAL specimens     only), is one component of a     comprehensive MRSA colonization     surveillance program. It is not     intended to diagnose MRSA     infection nor to guide or     monitor treatment for     MRSA infections.  WOUND CULTURE     Status: None   Collection Time    01/02/13  2:42 PM      Result Value Range Status   Specimen  Description WOUND RIGHT TOE   Final   Special Requests RT BABY TOE   Final   Gram Stain     Final   Value: RARE WBC PRESENT, PREDOMINANTLY PMN     NO SQUAMOUS EPITHELIAL CELLS SEEN     NO ORGANISMS SEEN     Performed at [facility]   Culture     Final   Value: FEW STAPHYLOCOCCUS SPECIES (COAGULASE NEGATIVE)     Performed at [facility]   Report Status 01/04/2013 FINAL   Final  CULTURE, BLOOD (ROUTINE X 2)     Status: None   Collection Time    01/06/13  1:00 PM      Result Value Range Status   Specimen Description BLOOD LEFT ANTECUBITAL   Final   Special Requests BOTTLES DRAWN AEROBIC AND ANAEROBIC [facility]   Final   Culture  Setup Time     Final   Value: 01/06/2013 17:01     Performed at [facility]   Culture     Final   Value:        BLOOD CULTURE RECEIVED NO GROWTH TO DATE CULTURE WILL BE HELD FOR 5 DAYS BEFORE ISSUING A FINAL NEGATIVE REPORT     Performed at [facility]   Report Status PENDING   Incomplete  CULTURE, BLOOD (ROUTINE X 2)     Status: None   Collection Time    01/06/13  1:09 PM      Result Value Range Status   Specimen Description BLOOD LEFT HAND   Final   Special Requests BOTTLES DRAWN AEROBIC AND ANAEROBIC 5CC   Final   Culture  Setup Time     Final   Value: 01/06/2013 17:00     Performed at [facility]   Culture     Final   Value:        BLOOD CULTURE RECEIVED NO GROWTH TO DATE CULTURE WILL BE HELD FOR 5 DAYS BEFORE ISSUING A FINAL NEGATIVE REPORT     Performed at [facility]   Report Status PENDING   Incomplete  CULTURE, EXPECTORATED SPUTUM-ASSESSMENT     Status: None  Collection Time    01/06/13  3:44 PM      Result Value Range Status   Specimen Description SPUTUM   Final   Special Requests NONE   Final   Sputum evaluation     Final   Value: THIS SPECIMEN IS ACCEPTABLE. RESPIRATORY CULTURE REPORT TO FOLLOW.   Report Status 01/06/2013 FINAL   Final  CULTURE, RESPIRATORY (NON-EXPECTORATED)     Status:  None   Collection Time    01/06/13  3:44 PM      Result Value Range Status   Specimen Description SPUTUM   Final   Special Requests NONE   Final   Gram Stain     Final   Value: MODERATE WBC PRESENT, PREDOMINANTLY PMN     FEW SQUAMOUS EPITHELIAL CELLS PRESENT     MODERATE GRAM VARIABLE ROD     MODERATE GRAM POSITIVE COCCI IN PAIRS     Performed at [facility]   Culture PENDING   Incomplete   Report Status PENDING   Incomplete    Studies/Results: Dg Chest Port 1 View  01/06/2013   CLINICAL DATA:  Respiratory distress  EXAM: PORTABLE CHEST - 1 VIEW  COMPARISON:  07/03/2012  FINDINGS: Multifocal patchy opacities, predominantly in the left upper and bilateral lower lobes, suspicious for pneumonia. Interstitial edema is considered less likely.  Linear scarring in the right lower lobe, chronic. No pleural effusion or pneumothorax.  The heart is normal in size.  IMPRESSION: Suspected multifocal pneumonia, left upper lobe predominant, as described above.   Electronically Signed   By: Charline Bills M.D.   On: 01/06/2013 08:20     Assessment/Plan: Osteomyelitis of R 5th toe  Wound Cx CNS Multifocal pneumonia, suspected aspiration PVD- occluded popliteal artery Multifocal PNA  Prior CVA with R hemiparesis  Total days of antibiotics: 7 (vanco/cefepime)  Would plan for 8 days of anbx from aspiration event 11-29 Respiratory Cx appears to be upper respiratory flora. His toe is improving.   Could consider prolonged bactrim or doxy at d/c.  He, his daughter and I spoke at length about amputation pros and cons.          Johny Sax Infectious Diseases (pager) [phone] [email or web address] 01/07/2013, 10:19 AM  LOS: 6 days

## 2013-01-07 NOTE — Procedures (Signed)
Objective Swallowing Evaluation: Modified Barium Swallowing Study  Patient Details  Name: Glenn Barron MRN: [id number] Date of Birth: [birth year]  Today's Date: 01/07/2013 Time: 1051-1130 SLP Time Calculation (min): 39 min  Past Medical History:  Past Medical History  Diagnosis Date  . Allergic rhinitis, cause unspecified   . Unspecified chronic bronchitis   . Other pulmonary embolism and infarction   . Paroxysmal a-fib   . Cerebrovascular disease, unspecified 10/2002    right side weakness  . Peripheral vascular disease, unspecified   . Unspecified venous (peripheral) insufficiency   . Acute venous embolism and thrombosis of unspecified deep vessels of lower extremity   . Hyperlipidemia   . GERD (gastroesophageal reflux disease)   . Diverticulosis of colon (without mention of hemorrhage)   . Irritable bowel syndrome   . Benign neoplasm of colon   . Unspecified hereditary and idiopathic peripheral neuropathy   . Depression   . Right bundle branch block   . History of scarlet fever 1944  . Aortic stenosis, mild     mild by echo 04/2011  . Heart murmur   . Hypertension     Dr. Eden Emms cardiologist ; "haven't taken anything for a couple years since losing weight" (01/01/2013)  . DVT (deep venous thrombosis)     "put filter in to prevent" (01/01/2013)  . Asthma     "mild" (01/01/2013)  . Pneumonia     "a couple times" (01/01/2013)  . Exertional shortness of breath     "as I get older" (01/01/2013)  . Unspecified cerebral artery occlusion with cerebral infarction   . Stroke 09/2002    "right foot still drags" (01/01/2013)  . Osteoarthrosis, unspecified whether generalized or localized, unspecified site   . Arthritis     "both ankles" (01/01/2013)  . DDD (degenerative disc disease)   . Malignant neoplasm of prostate   . Skin cancer     "off my face" (01/01/2013)   Past Surgical History:  Past Surgical History  Procedure Laterality Date  . Prostatectomy    . Cataract  extraction w/ intraocular lens  implant, bilateral Bilateral   . Carotid endarterectomy Right   . Abdominal aortic aneurysm repair    . Skin cancer excision      "left face and arms" (01/01/2013)  . Tonsillectomy and adenoidectomy    . Inguinal hernia repair Left 1991  . Inguinal hernia repair Right 1930's  . Soft tissue mass excision  1957    "off left arm and back" (01/01/2013)  . Vena cava filter placement Right    HPI:  79 yo male adm to [facility] for toe infection, during hospital course pt demonstrated respiratory issues requiring increased respiratory support and transfer to step down unit.  Pt has h/o allergies, pulmonary fibrosis per CXRs (pt denies), CVA with dysphagia.  Pt was disoriented earlier during hospital stay- ? contributing factor to pna, ? due to morphine and family reports speech was difficult to understand.  BSE ordered.  Pt CXR indicated multi-lobar pna.  Pt admits to issues with coughing up secretions for months which he attributes to sinus issues.  He does admit to issues with swallowing pills with them "coming back up" approx 2- 5 minutes after he takes them - occuring frequently over the last five months.  He also had pna in 1999 or 2000 when he had his aortic aneurysm.  In additiion pt has required to be patted on the back previously when choking during intake.  Suspect acute on chronic  multifactorial dysphagia with oropharyngeal and cervical esophageal issues.       Assessment / Plan / Recommendation Clinical Impression  Dysphagia Diagnosis: Moderate pharyngeal phase dysphagia;Severe pharyngeal phase dysphagia;Mild cervical esophageal phase dysphagia;Moderate cervical esophageal phase dysphagia Clinical impression: Patient presents with a moderate-severe pharyngeal and cervical esophageal dysphagia characterized by decreased laryngeal and pharyngeal strength combined with prominent appearing CP and questionable lower esophageal deficits resulting in decreased airway closure  during the swallow and moderate-severe pharyngeal residuals post swallowing leading to eventual aspiration of all liquids consistencies tested. Clinician cueing for chin tuck successful at decreasing degree of residuals however does not fully prevent aspiration. Cues for hard cough and dry swallow moderately successful at clearing the airway. Suspect that dysphagia chronic in nature based on history. Discussed with patient in length who wishes to continue on a po diet with known risk. Dysphagia 3 (mechanical soft) diet with nectar thick liquids and use of compensatory strategies recommended as diet which will mitigate risk of aspiration at this time. SLP will f/u for education on use of strategies.     Treatment Recommendation  Therapy as outlined in treatment plan below    Diet Recommendation Dysphagia 3 (Mechanical Soft);Nectar-thick liquid   Liquid Administration via: Cup;No straw Medication Administration: Whole meds with puree Supervision: Patient able to self feed;Full supervision/cueing for compensatory strategies Compensations: Slow rate;Small sips/bites;Hard cough after swallow;Multiple dry swallows after each bite/sip Postural Changes and/or Swallow Maneuvers: Seated upright 90 degrees;Upright 30-60 min after meal    Other  Recommendations Oral Care Recommendations: Oral care BID Other Recommendations: Order thickener from pharmacy;Remove water pitcher;Prohibited food (jello, ice cream, thin soups)   Follow Up Recommendations  Home health SLP    Frequency and Duration min 2x/week  1 week        General Date of Onset: 01/06/13 HPI: 79 yo male adm to [facility] for toe infection, during hospital course pt demonstrated respiratory issues requiring increased respiratory support and transfer to step down unit.  Pt has h/o allergies, pulmonary fibrosis per CXRs (pt denies), CVA with dysphagia.  Pt was disoriented earlier during hospital stay- ? contributing factor to pna, ? due to morphine and  family reports speech was difficult to understand.  BSE ordered.  Pt CXR indicated multi-lobar pna.  Pt admits to issues with coughing up secretions for months which he attributes to sinus issues.  He does admit to issues with swallowing pills with them "coming back up" approx 2- 5 minutes after he takes them - occuring frequently over the last five months.  He also had pna in 1999 or 2000 when he had his aortic aneurysm.  In additiion pt has required to be patted on the back previously when choking during intake.  Suspect acute on chronic multifactorial dysphagia with oropharyngeal and cervical esophageal issues.   Type of Study: Modified Barium Swallowing Study Reason for Referral: Objectively evaluate swallowing function Previous Swallow Assessment: see HPI Diet Prior to this Study: NPO Temperature Spikes Noted: No Respiratory Status: Nasal cannula History of Recent Intubation: No Behavior/Cognition: Alert;Cooperative;Pleasant mood Oral Cavity - Dentition: Adequate natural dentition Oral Motor / Sensory Function: Within functional limits Self-Feeding Abilities: Able to feed self Patient Positioning: Upright in chair Baseline Vocal Quality: Hoarse Volitional Cough: Congested Volitional Swallow: Able to elicit Anatomy: Other (Comment) (anterior curvature of cervical spine) Pharyngeal Secretions: Not observed secondary MBS    Reason for Referral Objectively evaluate swallowing function   Oral Phase Oral Preparation/Oral Phase Oral Phase: WFL   Pharyngeal Phase Pharyngeal  Phase Pharyngeal Phase: Impaired Pharyngeal - Honey Pharyngeal - Honey Cup: Delayed swallow initiation;Premature spillage to valleculae;Reduced epiglottic inversion;Reduced pharyngeal peristalsis;Reduced anterior laryngeal mobility;Reduced laryngeal elevation;Reduced airway/laryngeal closure;Reduced tongue base retraction;Penetration/Aspiration during swallow;Penetration/Aspiration after swallow;Moderate  aspiration;Pharyngeal residue - valleculae;Pharyngeal residue - pyriform sinuses Penetration/Aspiration details (honey cup): Material enters airway, passes BELOW cords without attempt by patient to eject out (silent aspiration) Pharyngeal - Nectar Pharyngeal - Nectar Cup: Delayed swallow initiation;Premature spillage to valleculae;Reduced epiglottic inversion;Reduced pharyngeal peristalsis;Reduced anterior laryngeal mobility;Reduced laryngeal elevation;Reduced airway/laryngeal closure;Reduced tongue base retraction;Penetration/Aspiration during swallow;Penetration/Aspiration after swallow;Moderate aspiration;Pharyngeal residue - valleculae;Pharyngeal residue - pyriform sinuses Penetration/Aspiration details (nectar cup): Material enters airway, passes BELOW cords without attempt by patient to eject out (silent aspiration) Pharyngeal - Thin Pharyngeal - Thin Cup: Delayed swallow initiation;Premature spillage to valleculae;Reduced epiglottic inversion;Reduced pharyngeal peristalsis;Reduced anterior laryngeal mobility;Reduced laryngeal elevation;Reduced airway/laryngeal closure;Reduced tongue base retraction;Penetration/Aspiration during swallow;Penetration/Aspiration after swallow;Moderate aspiration;Pharyngeal residue - valleculae;Pharyngeal residue - pyriform sinuses Penetration/Aspiration details (thin cup): Material enters airway, passes BELOW cords without attempt by patient to eject out (silent aspiration) Pharyngeal - Solids Pharyngeal - Puree: Delayed swallow initiation;Premature spillage to valleculae;Reduced epiglottic inversion;Reduced pharyngeal peristalsis;Reduced anterior laryngeal mobility;Reduced laryngeal elevation;Reduced airway/laryngeal closure;Reduced tongue base retraction;Penetration/Aspiration after swallow;Pharyngeal residue - valleculae;Pharyngeal residue - pyriform sinuses Penetration/Aspiration details (puree): Material enters airway, remains ABOVE vocal cords and not ejected  out Pharyngeal - Mechanical Soft: Delayed swallow initiation;Premature spillage to valleculae;Reduced epiglottic inversion;Reduced pharyngeal peristalsis;Reduced anterior laryngeal mobility;Reduced laryngeal elevation;Reduced airway/laryngeal closure;Reduced tongue base retraction;Pharyngeal residue - valleculae;Pharyngeal residue - pyriform sinuses Penetration/Aspiration details (mechanical soft): Material does not enter airway Pharyngeal - Pill: Delayed swallow initiation;Premature spillage to valleculae;Reduced anterior laryngeal mobility;Reduced epiglottic inversion;Reduced pharyngeal peristalsis;Reduced laryngeal elevation;Reduced airway/laryngeal closure;Reduced tongue base retraction;Pharyngeal residue - valleculae;Pharyngeal residue - pyriform sinuses Pharyngeal Phase - Comment Pharyngeal Comment: provided whole in pureed solids  Cervical Esophageal Phase    GO   Ferdinand Lango MA, CCC-SLP [phone]  Cervical Esophageal Phase Cervical Esophageal Phase: Impaired (prominent appearing CP)         Glenn Barron 01/07/2013, 1:06 PM

## 2013-01-07 NOTE — Progress Notes (Signed)
Paged MD for IV team about placing order for gelfoam dressing. Called pharmacy to send up gelfoam dressing without written order due to PICC site bleeding. Order was obtained from MD. Will continue to monitor patient.

## 2013-01-08 DIAGNOSIS — J9600 Acute respiratory failure, unspecified whether with hypoxia or hypercapnia: Secondary | ICD-10-CM

## 2013-01-08 DIAGNOSIS — J209 Acute bronchitis, unspecified: Secondary | ICD-10-CM

## 2013-01-08 LAB — CBC WITH DIFFERENTIAL/PLATELET
Basophils Absolute: 0 10*3/uL (ref 0.0–0.1)
Eosinophils Absolute: 0.1 10*3/uL (ref 0.0–0.7)
Eosinophils Relative: 1 % (ref 0–5)
HCT: 37.4 % — ABNORMAL LOW (ref 39.0–52.0)
Hemoglobin: 12.7 g/dL — ABNORMAL LOW (ref 13.0–17.0)
Lymphocytes Relative: 16 % (ref 12–46)
Lymphs Abs: 1.7 10*3/uL (ref 0.7–4.0)
MCH: 33.3 pg (ref 26.0–34.0)
MCV: 98.2 fL (ref 78.0–100.0)
Monocytes Absolute: 1.1 10*3/uL — ABNORMAL HIGH (ref 0.1–1.0)
Neutro Abs: 7.6 10*3/uL (ref 1.7–7.7)
RDW: 14.1 % (ref 11.5–15.5)
WBC: 10.5 10*3/uL (ref 4.0–10.5)

## 2013-01-08 MED ORDER — DEXTROSE 5 % IV SOLN
1.0000 g | Freq: Two times a day (BID) | INTRAVENOUS | Status: DC
  Administered 2013-01-08 – 2013-01-09 (×2): 1 g via INTRAVENOUS
  Filled 2013-01-08 (×3): qty 1

## 2013-01-08 MED ORDER — WARFARIN SODIUM 3 MG PO TABS
3.0000 mg | ORAL_TABLET | Freq: Once | ORAL | Status: AC
  Administered 2013-01-08: 3 mg via ORAL
  Filled 2013-01-08: qty 1

## 2013-01-08 NOTE — Progress Notes (Signed)
Speech Language Pathology Treatment: Dysphagia  Patient Details Name: Glenn Barron MRN: [id number] DOB: 1922/06/12 Today's Date: 01/08/2013 Time: 4098-1191 SLP Time Calculation (min): 16 min  Assessment / Plan / Recommendation Clinical Impression  F/u after 12/1 MBS.  Pt able to describe in vague terms results of yesterday's study, but unclear about chronic aspiration risk and strategies to minimize aspiration.  Reviewed results/recs from test.  Reviewed and practiced basic strategies - chin tuck, immediate cough, followed by dry swallow - pt required mod verbal/tactile cues to execute.  Using techniques will facilitate safer swallow- it is hoped that overall improvements in medical condition/strength will be associated with concomitant improvements in swallow.     HPI HPI: 79 yo male adm to [facility] for toe infection, during hospital course pt demonstrated respiratory issues requiring increased respiratory support and transfer to step down unit.  Pt has h/o allergies, pulmonary fibrosis per CXRs (pt denies), CVA with dysphagia.  Pt was disoriented earlier during hospital stay- ? contributing factor to pna, ? due to morphine and family reports speech was difficult to understand.  BSE ordered.  Pt CXR indicated multi-lobar pna.  Pt admits to issues with coughing up secretions for months which he attributes to sinus issues.  He does admit to issues with swallowing pills with them "coming back up" approx 2- 5 minutes after he takes them - occuring frequently over the last five months.  He also had pna in 1999 or 2000 when he had his aortic aneurysm.  In additiion pt has required to be patted on the back previously when choking during intake.  Suspect acute on chronic multifactorial dysphagia with oropharyngeal and cervical esophageal issues.        SLP Plan  Continue with current plan of care    Recommendations Diet recommendations: Dysphagia 3 (mechanical soft);Nectar-thick liquid Liquids provided via:  Cup Medication Administration: Whole meds with puree Supervision: Patient able to self feed;Full supervision/cueing for compensatory strategies Compensations: Slow rate;Small sips/bites;Hard cough after swallow;Multiple dry swallows after each bite/sip Postural Changes and/or Swallow Maneuvers: Seated upright 90 degrees;Upright 30-60 min after meal              Oral Care Recommendations: Oral care BID Follow up Recommendations: Home health SLP Plan: Continue with current plan of care        Dierks Wach L. Samson Frederic, [place] CCC/SLP Pager [phone]  Blenda Mounts Laurice 01/08/2013, 9:45 AM

## 2013-01-08 NOTE — Progress Notes (Signed)
INFECTIOUS DISEASE PROGRESS NOTE  ID: NEFTALY Barron is a 79 y.o. male with  Principal Problem:   Cellulitis Active Problems:   PROSTATE CANCER   HYPERLIPIDEMIA   HYPERTENSION   CONGESTIVE HEART FAILURE   CEREBROVASCULAR DISEASE   Edema   Aneurysm of artery of lower extremity   Atrial fibrillation   Cellulitis and abscess   Osteomyelitis of toe   Acute respiratory failure   Aspiration pneumonitis  Subjective: Without complaints. Resting quietly, awakens easily.  Sat-ing 100% while asleep with mouth open.   Abtx:  Anti-infectives   Start     Dose/Rate Route Frequency Ordered Stop   01/06/13 1000  ceFEPIme (MAXIPIME) 1 g in dextrose 5 % 50 mL IVPB     1 g 100 mL/hr over 30 Minutes Intravenous 3 times per day 01/06/13 0842     01/02/13 2030  ciprofloxacin (CIPRO) IVPB 400 mg  Status:  Discontinued     400 mg 200 mL/hr over 60 Minutes Intravenous Every 24 hours 01/01/13 2038 01/06/13 0831   01/02/13 0900  vancomycin (VANCOCIN) IVPB 750 mg/150 ml premix     750 mg 150 mL/hr over 60 Minutes Intravenous Every 12 hours 01/01/13 2038     01/01/13 2030  ciprofloxacin (CIPRO) IVPB 400 mg  Status:  Discontinued     400 mg 200 mL/hr over 60 Minutes Intravenous Every 12 hours 01/01/13 2021 01/01/13 2038   01/01/13 1915  cefTRIAXone (ROCEPHIN) 1 g in dextrose 5 % 50 mL IVPB     1 g 100 mL/hr over 30 Minutes Intravenous  Once 01/01/13 1905 01/01/13 2000   01/01/13 1915  vancomycin (VANCOCIN) IVPB 1000 mg/200 mL premix     1,000 mg 200 mL/hr over 60 Minutes Intravenous  Once 01/01/13 1905 01/01/13 2107      Medications:  Scheduled: . acetaminophen  650 mg Oral BID  . aspirin EC  81 mg Oral Daily  . ceFEPime (MAXIPIME) IV  1 g Intravenous Q8H  . FLORA-Q  1 capsule Oral Daily  . fluticasone  1 spray Each Nare BID  . furosemide  40 mg Intravenous Daily  . mometasone-formoterol  2 puff Inhalation BID  . predniSONE  5 mg Oral Q48H  . senna-docusate  2 tablet Oral BID  .  sodium chloride  10-40 mL Intracatheter Q12H  . sodium chloride  3 mL Intravenous Q12H  . sodium chloride  3 mL Intravenous Q12H  . vancomycin  750 mg Intravenous Q12H  . Warfarin - Pharmacist Dosing Inpatient   Does not apply q1800    Objective: Vital signs in last 24 hours: Temp:  [97.6 F (36.4 C)-98.2 F (36.8 C)] 97.6 F (36.4 C) (12/02 0700) Pulse Rate:  [63-88] 66 (12/02 0700) Resp:  [15-25] 17 (12/02 0700) BP: (101-148)/(55-83) 148/55 mmHg (12/02 0700) SpO2:  [94 %-99 %] 94 % (12/02 0700) Weight:  [77.52 kg (170 lb 14.4 oz)] 77.52 kg (170 lb 14.4 oz) (12/02 0501)   General appearance: alert, cooperative and no distress Resp: rhonchi bilaterally Cardio: regular rate and rhythm GI: normal findings: soft, non-tender Extremities: R 5th toe non-painful. there is a loose thickened callus over his toe.   Lab Results  Recent Labs  01/06/13 1300 01/06/13 1800 01/07/13 0335  WBC 19.1* 20.6*  --   HGB 15.4 14.2  --   HCT 46.2 43.1  --   NA 138  --  139  K 3.5  --  3.5  CL 101  --  104  CO2 27  --  28  BUN 33*  --  37*  CREATININE 1.27  --  1.11   Liver Panel No results found for this basename: PROT, ALBUMIN, AST, ALT, ALKPHOS, BILITOT, BILIDIR, IBILI,  in the last 72 hours Sedimentation Rate No results found for this basename: ESRSEDRATE,  in the last 72 hours C-Reactive Protein No results found for this basename: CRP,  in the last 72 hours  Microbiology: Recent Results (from the past 240 hour(s))  MRSA PCR SCREENING     Status: None   Collection Time    01/01/13 10:35 PM      Result Value Range Status   MRSA by PCR NEGATIVE  NEGATIVE Final   Comment:            The GeneXpert MRSA Assay (FDA     approved for NASAL specimens     only), is one component of a     comprehensive MRSA colonization     surveillance program. It is not     intended to diagnose MRSA     infection nor to guide or     monitor treatment for     MRSA infections.  WOUND CULTURE      Status: None   Collection Time    01/02/13  2:42 PM      Result Value Range Status   Specimen Description WOUND RIGHT TOE   Final   Special Requests RT BABY TOE   Final   Gram Stain     Final   Value: RARE WBC PRESENT, PREDOMINANTLY PMN     NO SQUAMOUS EPITHELIAL CELLS SEEN     NO ORGANISMS SEEN     Performed at [facility]   Culture     Final   Value: FEW STAPHYLOCOCCUS SPECIES (COAGULASE NEGATIVE)     Performed at [facility]   Report Status 01/04/2013 FINAL   Final  CULTURE, BLOOD (ROUTINE X 2)     Status: None   Collection Time    01/06/13  1:00 PM      Result Value Range Status   Specimen Description BLOOD LEFT ANTECUBITAL   Final   Special Requests BOTTLES DRAWN AEROBIC AND ANAEROBIC [facility]   Final   Culture  Setup Time     Final   Value: 01/06/2013 17:01     Performed at [facility]   Culture     Final   Value:        BLOOD CULTURE RECEIVED NO GROWTH TO DATE CULTURE WILL BE HELD FOR 5 DAYS BEFORE ISSUING A FINAL NEGATIVE REPORT     Performed at [facility]   Report Status PENDING   Incomplete  CULTURE, BLOOD (ROUTINE X 2)     Status: None   Collection Time    01/06/13  1:09 PM      Result Value Range Status   Specimen Description BLOOD LEFT HAND   Final   Special Requests BOTTLES DRAWN AEROBIC AND ANAEROBIC 5CC   Final   Culture  Setup Time     Final   Value: 01/06/2013 17:00     Performed at [facility]   Culture     Final   Value:        BLOOD CULTURE RECEIVED NO GROWTH TO DATE CULTURE WILL BE HELD FOR 5 DAYS BEFORE ISSUING A FINAL NEGATIVE REPORT     Performed at [facility]   Report Status PENDING   Incomplete  CULTURE, EXPECTORATED SPUTUM-ASSESSMENT     Status: None   Collection Time    01/06/13  3:44 PM      Result Value Range Status   Specimen Description SPUTUM   Final   Special Requests NONE   Final   Sputum evaluation     Final   Value: THIS SPECIMEN IS ACCEPTABLE. RESPIRATORY CULTURE REPORT TO FOLLOW.    Report Status 01/06/2013 FINAL   Final  CULTURE, RESPIRATORY (NON-EXPECTORATED)     Status: None   Collection Time    01/06/13  3:44 PM      Result Value Range Status   Specimen Description SPUTUM   Final   Special Requests NONE   Final   Gram Stain     Final   Value: MODERATE WBC PRESENT, PREDOMINANTLY PMN     FEW SQUAMOUS EPITHELIAL CELLS PRESENT     MODERATE GRAM VARIABLE ROD     MODERATE GRAM POSITIVE COCCI IN PAIRS     Performed at [facility]   Culture     Final   Value: NORMAL OROPHARYNGEAL FLORA     Performed at [facility]   Report Status PENDING   Incomplete    Studies/Results: Dg Chest 2 View  01/07/2013   CLINICAL DATA:  Cough, congestion.  EXAM: CHEST  2 VIEW  COMPARISON:  01/06/2013.  FINDINGS: There is left lower lobe hazy airspace opacity. There is right lower lobe focal airspace disease. There may be a trace right pleural effusion. There is no pneumothorax. Stable cardiomediastinal silhouette. There are bilateral glenohumeral osteoarthritic changes.  IMPRESSION: Bilateral lower lobe airspace disease concerning for multifocal pneumonia.   Electronically Signed   By: Elige Ko   On: 01/07/2013 13:56   Dg Chest Port 1 View  01/07/2013   CLINICAL DATA:  Confirm line placement  EXAM: PORTABLE CHEST - 1 VIEW  COMPARISON:  January 07, 2013 at 12/1937  FINDINGS: Stable cardiac enlargement. Discoid atelectasis right lower lobe stable. Infiltrate left lower lung zones stable. Right PICC line has been placed with no pneumothorax. PICC line projects 3.0 cm into the right atrium.  IMPRESSION: PICC line tip extends into the right atrium about 3.0 cm beyond the cavoatrial junction.   Electronically Signed   By: Esperanza Heir M.D.   On: 01/07/2013 20:43   Dg Swallowing Func-speech Pathology  01/07/2013   Vivi Ferns McCoy, CCC-SLP     01/07/2013  1:06 PM Objective Swallowing Evaluation: Modified Barium Swallowing Study   Patient Details  Name: Glenn Barron MRN:  [id number] Date of Birth: May 12, 1922  Today's Date: 01/07/2013 Time: 1051-1130 SLP Time Calculation (min): 39 min  Past Medical History:  Past Medical History  Diagnosis Date  . Allergic rhinitis, cause unspecified   . Unspecified chronic bronchitis   . Other pulmonary embolism and infarction   . Paroxysmal a-fib   . Cerebrovascular disease, unspecified 10/2002    right side weakness  . Peripheral vascular disease, unspecified   . Unspecified venous (peripheral) insufficiency   . Acute venous embolism and thrombosis of unspecified deep  vessels of lower extremity   . Hyperlipidemia   . GERD (gastroesophageal reflux disease)   . Diverticulosis of colon (without mention of hemorrhage)   . Irritable bowel syndrome   . Benign neoplasm of colon   . Unspecified hereditary and idiopathic peripheral neuropathy   . Depression   . Right bundle branch block   . History of scarlet fever 1944  . Aortic stenosis, mild  mild by echo 04/2011  . Heart murmur   . Hypertension     Dr. Eden Emms cardiologist ; "haven't taken anything for a couple  years since losing weight" (01/01/2013)  . DVT (deep venous thrombosis)     "put filter in to prevent" (01/01/2013)  . Asthma     "mild" (01/01/2013)  . Pneumonia     "a couple times" (01/01/2013)  . Exertional shortness of breath     "as I get older" (01/01/2013)  . Unspecified cerebral artery occlusion with cerebral infarction    . Stroke 09/2002    "right foot still drags" (01/01/2013)  . Osteoarthrosis, unspecified whether generalized or localized,  unspecified site   . Arthritis     "both ankles" (01/01/2013)  . DDD (degenerative disc disease)   . Malignant neoplasm of prostate   . Skin cancer     "off my face" (01/01/2013)   Past Surgical History:  Past Surgical History  Procedure Laterality Date  . Prostatectomy    . Cataract extraction w/ intraocular lens  implant, bilateral  Bilateral   . Carotid endarterectomy Right   . Abdominal aortic aneurysm repair    . Skin cancer excision      "left  face and arms" (01/01/2013)  . Tonsillectomy and adenoidectomy    . Inguinal hernia repair Left 1991  . Inguinal hernia repair Right 1930's  . Soft tissue mass excision  1957    "off left arm and back" (01/01/2013)  . Vena cava filter placement Right    HPI:  79 yo male adm to [facility] for toe infection, during hospital course  pt demonstrated respiratory issues requiring increased  respiratory support and transfer to step down unit.  Pt has h/o  allergies, pulmonary fibrosis per CXRs (pt denies), CVA with  dysphagia.  Pt was disoriented earlier during hospital stay- ?  contributing factor to pna, ? due to morphine and family reports  speech was difficult to understand.  BSE ordered.  Pt CXR  indicated multi-lobar pna.  Pt admits to issues with coughing up  secretions for months which he attributes to sinus issues.  He  does admit to issues with swallowing pills with them "coming back  up" approx 2- 5 minutes after he takes them - occuring frequently  over the last five months.  He also had pna in 1999 or 2000 when  he had his aortic aneurysm.  In additiion pt has required to be  patted on the back previously when choking during intake.   Suspect acute on chronic multifactorial dysphagia with  oropharyngeal and cervical esophageal issues.       Assessment / Plan / Recommendation Clinical Impression  Dysphagia Diagnosis: Moderate pharyngeal phase dysphagia;Severe  pharyngeal phase dysphagia;Mild cervical esophageal phase  dysphagia;Moderate cervical esophageal phase dysphagia Clinical impression: Patient presents with a moderate-severe  pharyngeal and cervical esophageal dysphagia characterized by  decreased laryngeal and pharyngeal strength combined with  prominent appearing CP and questionable lower esophageal deficits  resulting in decreased airway closure during the swallow and  moderate-severe pharyngeal residuals post swallowing leading to  eventual aspiration of all liquids consistencies tested.  Clinician cueing  for chin tuck successful at decreasing degree of  residuals however does not fully prevent aspiration. Cues for  hard cough and dry swallow moderately successful at clearing the  airway. Suspect that dysphagia chronic in nature based on  history. Discussed with patient in length who wishes to continue  on a po diet with known risk. Dysphagia  3 (mechanical soft) diet  with nectar thick liquids and use of compensatory strategies  recommended as diet which will mitigate risk of aspiration at  this time. SLP will f/u for education on use of strategies.     Treatment Recommendation  Therapy as outlined in treatment plan below    Diet Recommendation Dysphagia 3 (Mechanical Soft);Nectar-thick  liquid   Liquid Administration via: Cup;No straw Medication Administration: Whole meds with puree Supervision: Patient able to self feed;Full supervision/cueing  for compensatory strategies Compensations: Slow rate;Small sips/bites;Hard cough after  swallow;Multiple dry swallows after each bite/sip Postural Changes and/or Swallow Maneuvers: Seated upright 90  degrees;Upright 30-60 min after meal    Other  Recommendations Oral Care Recommendations: Oral care BID Other Recommendations: Order thickener from pharmacy;Remove water  pitcher;Prohibited food (jello, ice cream, thin soups)   Follow Up Recommendations  Home health SLP    Frequency and Duration min 2x/week  1 week        General Date of Onset: 01/06/13 HPI: 79 yo male adm to [facility] for toe infection, during hospital  course pt demonstrated respiratory issues requiring increased  respiratory support and transfer to step down unit.  Pt has h/o  allergies, pulmonary fibrosis per CXRs (pt denies), CVA with  dysphagia.  Pt was disoriented earlier during hospital stay- ?  contributing factor to pna, ? due to morphine and family reports  speech was difficult to understand.  BSE ordered.  Pt CXR  indicated multi-lobar pna.  Pt admits to issues with coughing up  secretions for months which  he attributes to sinus issues.  He  does admit to issues with swallowing pills with them "coming back  up" approx 2- 5 minutes after he takes them - occuring frequently  over the last five months.  He also had pna in 1999 or 2000 when  he had his aortic aneurysm.  In additiion pt has required to be  patted on the back previously when choking during intake.   Suspect acute on chronic multifactorial dysphagia with  oropharyngeal and cervical esophageal issues.   Type of Study: Modified Barium Swallowing Study Reason for Referral: Objectively evaluate swallowing function Previous Swallow Assessment: see HPI Diet Prior to this Study: NPO Temperature Spikes Noted: No Respiratory Status: Nasal cannula History of Recent Intubation: No Behavior/Cognition: Alert;Cooperative;Pleasant mood Oral Cavity - Dentition: Adequate natural dentition Oral Motor / Sensory Function: Within functional limits Self-Feeding Abilities: Able to feed self Patient Positioning: Upright in chair Baseline Vocal Quality: Hoarse Volitional Cough: Congested Volitional Swallow: Able to elicit Anatomy: Other (Comment) (anterior curvature of cervical spine) Pharyngeal Secretions: Not observed secondary MBS    Reason for Referral Objectively evaluate swallowing function   Oral Phase Oral Preparation/Oral Phase Oral Phase: WFL   Pharyngeal Phase Pharyngeal Phase Pharyngeal Phase: Impaired Pharyngeal - Honey Pharyngeal - Honey Cup: Delayed swallow initiation;Premature  spillage to valleculae;Reduced epiglottic inversion;Reduced  pharyngeal peristalsis;Reduced anterior laryngeal  mobility;Reduced laryngeal elevation;Reduced airway/laryngeal  closure;Reduced tongue base retraction;Penetration/Aspiration  during swallow;Penetration/Aspiration after swallow;Moderate  aspiration;Pharyngeal residue - valleculae;Pharyngeal residue -  pyriform sinuses Penetration/Aspiration details (honey cup): Material enters  airway, passes BELOW cords without attempt by patient  to eject  out (silent aspiration) Pharyngeal - Nectar Pharyngeal - Nectar Cup: Delayed swallow initiation;Premature  spillage to valleculae;Reduced epiglottic inversion;Reduced  pharyngeal peristalsis;Reduced anterior laryngeal  mobility;Reduced laryngeal elevation;Reduced airway/laryngeal  closure;Reduced tongue base retraction;Penetration/Aspiration  during swallow;Penetration/Aspiration after swallow;Moderate  aspiration;Pharyngeal residue - valleculae;Pharyngeal residue -  pyriform sinuses Penetration/Aspiration details (nectar cup): Material enters  airway, passes BELOW cords without attempt by patient to eject  out (silent aspiration) Pharyngeal - Thin Pharyngeal - Thin Cup: Delayed swallow initiation;Premature  spillage to valleculae;Reduced epiglottic inversion;Reduced  pharyngeal peristalsis;Reduced anterior laryngeal  mobility;Reduced laryngeal elevation;Reduced airway/laryngeal  closure;Reduced tongue base retraction;Penetration/Aspiration  during swallow;Penetration/Aspiration after swallow;Moderate  aspiration;Pharyngeal residue - valleculae;Pharyngeal residue -  pyriform sinuses Penetration/Aspiration details (thin cup): Material enters  airway, passes BELOW cords without attempt by patient to eject  out (silent aspiration) Pharyngeal - Solids Pharyngeal - Puree: Delayed swallow initiation;Premature spillage  to valleculae;Reduced epiglottic inversion;Reduced pharyngeal  peristalsis;Reduced anterior laryngeal mobility;Reduced laryngeal  elevation;Reduced airway/laryngeal closure;Reduced tongue base  retraction;Penetration/Aspiration after swallow;Pharyngeal  residue - valleculae;Pharyngeal residue - pyriform sinuses Penetration/Aspiration details (puree): Material enters airway,  remains ABOVE vocal cords and not ejected out Pharyngeal - Mechanical Soft: Delayed swallow  initiation;Premature spillage to valleculae;Reduced epiglottic  inversion;Reduced pharyngeal peristalsis;Reduced anterior  laryngeal  mobility;Reduced laryngeal elevation;Reduced  airway/laryngeal closure;Reduced tongue base  retraction;Pharyngeal residue - valleculae;Pharyngeal residue -  pyriform sinuses Penetration/Aspiration details (mechanical soft): Material does  not enter airway Pharyngeal - Pill: Delayed swallow initiation;Premature spillage  to valleculae;Reduced anterior laryngeal mobility;Reduced  epiglottic inversion;Reduced pharyngeal peristalsis;Reduced  laryngeal elevation;Reduced airway/laryngeal closure;Reduced  tongue base retraction;Pharyngeal residue - valleculae;Pharyngeal  residue - pyriform sinuses Pharyngeal Phase - Comment Pharyngeal Comment: provided whole in pureed solids  Cervical Esophageal Phase    GO   Ferdinand Lango MA, CCC-SLP [phone]  Cervical Esophageal Phase Cervical Esophageal Phase: Impaired (prominent appearing CP)         McCoy Leah Meryl 01/07/2013, 1:06 PM      Assessment/Plan: Osteomyelitis of R 5th toe  Wound Cx CNS  Multifocal pneumonia, suspected aspiration  PVD- occluded popliteal artery  Multifocal PNA  Prior CVA with R hemiparesis  Total days of antibiotics: 8 (vanco/cefepime)  Can callus be removed from his toe? He has completed his anbx for his aspiration event.  Now the choice is how to complete therapy for his osteo of his 5th toe.  Would consider keeping him on IV anbx til he is ready for d/c then transition to prolonged oral doxy.  Would be glad to see him in ID clinic in f/u.  available if questions.          Johny Sax Infectious Diseases (pager) [phone] [email or web address] 01/08/2013, 9:21 AM  LOS: 7 days

## 2013-01-08 NOTE — Progress Notes (Signed)
Clinical Social Work Department BRIEF PSYCHOSOCIAL ASSESSMENT 01/08/2013  Patient:  Glenn Barron, Glenn Barron     Account Number:  [id number]     Admit date:  01/01/2013  Clinical Social Worker:  Varney Biles  Date/Time:  01/08/2013 03:32 PM  Referred by:  Physician  Date Referred:  01/08/2013 Referred for  SNF Placement   Other Referral:   Interview type:  Patient Other interview type:    PSYCHOSOCIAL DATA Living Status:  ALONE Admitted from facility:   Level of care:   Primary support name:  Bronislaus Verdell ([phone]) Primary support relationship to patient:  CHILD, ADULT Degree of support available:   Good--pt has son in [place] and daughter lives out of town.    CURRENT CONCERNS Current Concerns  Post-Acute Placement   Other Concerns:    SOCIAL WORK ASSESSMENT / PLAN RNCM was speaking with pt when CSW visited to complete assessment. RNCM explained that pt is eligible for specialty hospital and he is trying to decide if he would like to go to one of these in lieu of going to SNF before returning home. CSW explained CSW role in d/c planning to SNF if pt chooses this option. Pt states he is not ready to make a decision yet, and will need to discuss his options with his children. Pt transferred to new unit, and new CSW provided handoff.   Assessment/plan status:  Psychosocial Support/Ongoing Assessment of Needs Other assessment/ plan:   Information/referral to community resources:   SNF, if pt chooses this option. Also eligible for specialty hospital and is considering this.    PATIENT'S/FAMILY'S RESPONSE TO PLAN OF CARE: Pt very friendly, engaged CSW and RNCM in conversation and told stories about his children and extended family members. Pt uncertain concerning his d/c plan at this time. Transferred to new unit, and new CSW provided update on pt case. This CSW signing off.       Maryclare Labrador, MSW, [facility] Clinical Social Worker [phone]

## 2013-01-08 NOTE — Progress Notes (Signed)
Physical Therapy Treatment Patient Details Name: Glenn Barron MRN: [id number] DOB: 05-19-1922 Today's Date: 01/08/2013 Time: 4098-1191 PT Time Calculation (min): 34 min  PT Assessment / Plan / Recommendation  History of Present Illness [name] an 79 y/o male admitted for infection of right 5th toe, which is being treated non-operatively as there is concern for healing after amputation.   PT Comments   Pt able to progress with gait today but limited by fatigue. Pt with sats 96% on 1L during gait and educated to continue mobility and HEP. Pt with condom cath falling off with transfers with assist for pericare before hall ambulation. RN aware of mobility  Follow Up Recommendations  SNF     Does the patient have the potential to tolerate intense rehabilitation     Barriers to Discharge        Equipment Recommendations       Recommendations for Other Services    Frequency     Progress towards PT Goals Progress towards PT goals: Progressing toward goals  Plan Current plan remains appropriate    Precautions / Restrictions Precautions Precautions: Fall Restrictions Weight Bearing Restrictions: No   Pertinent Vitals/Pain No pain, feet are numb    Mobility  Bed Mobility Bed Mobility: Supine to Sit;Sitting - Scoot to Edge of Bed Supine to Sit: 4: Min assist;With rails;HOB flat Sitting - Scoot to [place] of Bed: 4: Min guard Details for Bed Mobility Assistance: cueing for sequence with assist to fuly elevate trunk from surface Transfers Sit to Stand: 4: Min assist;From bed;From chair/3-in-1;With armrests Stand to Sit: 4: Min assist;To chair/3-in-1;With armrests Details for Transfer Assistance: cueing for hand placement, scooting to edge of chair, anterior translation and assist to complete translation with increased assist needed from low chair and max cueing for safety to sit Ambulation/Gait Ambulation/Gait Assistance: 4: Min assist Ambulation Distance (Feet): 30 Feet (3' then  30') Assistive device: Rolling walker Ambulation/Gait Assistance Details: cueing for posture and position in RW with chair followed due to pt needing to sit quickly with fatigue and feeling as if knee would buckle Gait Pattern: Step-through pattern;Decreased stride length;Trunk flexed Gait velocity: Decreased    Exercises General Exercises - Lower Extremity Long Arc Quad: AROM;Seated;Both;15 reps Hip Flexion/Marching: AROM;Seated;Both;15 reps   PT Diagnosis:    PT Problem List:   PT Treatment Interventions:     PT Goals (current goals can now be found in the care plan section)    Visit Information  Last PT Received On: 01/08/13 Assistance Needed: +2 (safety with chair for gait) History of Present Illness: [name] an 79 y/o male admitted for infection of right 5th toe, which is being treated non-operatively as there is concern for healing after amputation.    Subjective Data      Cognition  Cognition Arousal/Alertness: Awake/alert Behavior During Therapy: WFL for tasks assessed/performed Overall Cognitive Status: Within Functional Limits for tasks assessed    Balance     End of Session PT - End of Session Equipment Utilized During Treatment: Gait belt Activity Tolerance: Patient limited by fatigue Patient left: in chair;with call bell/phone within reach Nurse Communication: Mobility status   GP     Delorse Lek 01/08/2013, 10:17 AM Delaney Meigs, PT [phone]

## 2013-01-08 NOTE — Progress Notes (Signed)
Patient ID: Glenn Barron, male   DOB: Dec 16, 1922, 79 y.o.   MRN: [id number] Open respiratory standpoint this morning. Up in chair. He ready to walk with physical therapy. Markedly deconditioned. Now needing neck thick diet per recommendations for swallowing evaluation. Right foot no significant change. Does have a fissure between his fourth and fifth toe and a small open area in the lateral aspect.  I do not feel patient is a candidate for major revascularization do to a major comorbidities. Would recommend continued antibiotics as is being done. It has progression of necrosis in toto will require toe amputation with high risk for nonhealing and eventual below-knee knee amputation.  Following with you

## 2013-01-08 NOTE — Progress Notes (Signed)
ANTICOAGULATION CONSULT NOTE - Follow Up Consult  Pharmacy Consult for Coumadin  Indication: h/o DVT and PE, afib   Allergies  Allergen Reactions  . Doxycycline     Rash  . Amoxicillin Rash    Yeast infection     Labs:  Recent Labs  01/06/13 0700 01/06/13 1300 01/06/13 1800 01/07/13 0335 01/08/13 0515  HGB  --  15.4 14.2  --   --   HCT  --  46.2 43.1  --   --   PLT  --  152 143*  --   --   LABPROT 20.1*  --   --  21.6* 25.8*  INR 1.77*  --   --  1.94* 2.45*  CREATININE  --  1.27  --  1.11  --     Estimated Creatinine Clearance: 50.4 ml/min (by C-G formula based on Cr of 1.11).   Assessment: AC: 79 y.o. male on coumadin for h/o DVT and PE, afib. INR = 2.45 today  Goal of Therapy: INR 2-3 Monitor platelets by anticoagulation protocol: Yes   Plan:  1. Coumadin 3 mg po x 1 dose at 1800 pm 2. Daily INR  Thank you. Okey Regal, PharmD [phone]  01/08/2013,10:50 AM

## 2013-01-08 NOTE — Progress Notes (Signed)
Pt tolerated OOB in chair for 3 hours on room air. Appetite improving. Generalized neuropathic pain controllled with positioning and careful handling. Pt's home health aid visited in pm prior to transfer to [facility].

## 2013-01-08 NOTE — Progress Notes (Signed)
OT Cancellation Note  Patient Details Name: AHMEER TUMAN MRN: [id number] DOB: 10/26/1922   Cancelled Treatment:    Reason Eval/Treat Not Completed: Other (comment) Pt plans to D/C to SNF. Will defer OT to SNF. If eval needed, please reorder. [facility] Damin Salido, OTR/L  [phone] 01/08/2013 01/08/2013, 2:26 PM

## 2013-01-08 NOTE — Care Management Note (Signed)
    Page 1 of 1   01/08/2013     3:06:01 PM   CARE MANAGEMENT NOTE 01/08/2013  Patient:  Glenn Barron, Glenn Barron   Account Number:  [id number]  Date Initiated:  01/08/2013  Documentation initiated by:  Alyric Parkin  Subjective/Objective Assessment:   adm with dx of osteomyelitis; lives alone with paid caregiver 8 hrs/day, 7 days/wk    PCP  Dr Alroy Dust     DC Planning Services  CM consult      Per UR Regulation:  Reviewed for med. necessity/level of care/duration of stay  If discussed at Long Length of Stay Meetings, dates discussed:   01/08/2013   Comments:  Contact:  Kem Kays, dtr  [phone]  01/08/13 1400 Imanni Burdine RN MSN BSN CCM PT recommends SNF, pt qualifies for LTAC.  Discussed options with pt who states he has been @ [facility] previously.  Is interested in LTAC, requests CM talk with son and dtr.  TC to dtr and explained options and choices. She is interested in touring [facility] and talking with admissions coordinator.  TC to Driscilla Grammes who will call her and arrange tour.  Dtr states she will talk with her brother re options.

## 2013-01-08 NOTE — Progress Notes (Signed)
[facility] PROGRESS NOTE  NYSHAWN GOWDY AOZ:[id number] DOB: [birth year] DOA: 01/01/2013 PCP: Michele Mcalpine, MD  Brief Narrative: 79 y.o. male from home with round the clock help, hx of PAF, prior PE and DVT on chronic anticoagulation, hx of PVD, mild AS, HTN, chronic lower extremity swelling, presented to the ER with increased swelling, redness, and discharge from his right foot. He had purulent discharge over the 5th toe along with increased pain. He had no fever, chills, nausea, vomiting or any acute systemic symptoms. Evalaution in the ER showed normal WBC, no fever, and normal renal fx.    Assessment/Plan: Acute respiratory failure with hypoxia due to aspiration (dysphagia) vs HCAP -stable on [place] oxygen -pt aware of recurrent episodes of aspiration while eating/drinking -SLP following: rec D3 diet with nectar thick liquids -cont empiric anbx's -ID following  Osteomyelitis small toe on the right foot/severe PVD:  - orthopedics following - venous duplex showed chronic DVT. - vascular consulted and recommendations given . Will continue with IV antibiotics and conservative management, as healing of the toe amputation without revascularization will be very difficult, in view of his occluded popliteal artery. Discussed witht he patient and he agrees to a conservative approach and IV antibiotics.  -12/02: Day # 8 of anbx's-recommend IV anbx's then transition to PO doxycycline at time of DC -ID says can follow as OP in their clinic  Cellulitis and myositis of the RLE - resolved - elevate leg.   Atrial fibrillation - rate controlled.  - on chronic anticoagulation with recent subtherapeutic INR but now therapeutic  Hypertension /moderate LVH -controlled on Lasix/not on other meds pre admit  Chronic Prednisone/?Asthma -cont Prednisone  Deconditioning -LTAC eval vs SNF  DVT prophylaxis: Coumadin Code Status: full code Family Communication: none at bedside, . Disposition  Plan: ? SNF placement at discharge vs LTAC per recs PT-transfer to floor   Consultants: Orthopedics Vascular surgery.   Procedures: Venous duplex of the Lower extremity  Findings consistent with chronic deep vein thrombosis involving the right lower extremity. - No evidence of deep vein thrombosis involving the left lower extremity. - No evidence of superficial thombosis of the right or left lower extremity. - No evidence of Baker's cyst on the right or left.   Lower Extremity Arterial Evaluation Bilateral: ABI is not ascertained due to the posterior tibial artery being non compressible secondary to calcification. Great toe pressure indicates adequate perfusion.   Antibiotics:  Vancomycin 11/25  Rocephin 11 25 one dose  Ciprofloxacin 11/26  HPI/Subjective: Up in chair, concerned my require toe amputation over the long term. No CP or SOB.  Objective: Filed Vitals:   01/08/13 0700  BP: 148/55  Pulse: 66  Temp: 97.6 F (36.4 C)  Resp: 17    Intake/Output Summary (Last 24 hours) at 01/08/13 1057 Last data filed at 01/08/13 1000  Gross per 24 hour  Intake   1350 ml  Output   1245 ml  Net    105 ml   Filed Weights   01/01/13 2037 01/08/13 0501  Weight: 164 lb 11.2 oz (74.707 kg) 170 lb 14.4 oz (77.52 kg)    Exam: General: No acute respiratory distress Lungs: Clear to auscultation bilaterally without wheezes or crackles, RA Cardiovascular: Regular rate and rhythm without murmur gallop or rub normal S1 and S2, no peripheral edema or JVD Abdomen: Nontender, nondistended, soft, bowel sounds positive, no rebound, no ascites, no appreciable mass Musculoskeletal: No significant cyanosis, clubbing of bilateral lower extremities Neurological: Alert and oriented x  3, moves all extremities x 4 without focal neurological deficits, CN 2-12 intact  Data Reviewed: Basic Metabolic Panel:  Recent Labs Lab 01/01/13 1620 01/03/13 0355 01/06/13 1300 01/07/13 0335  NA  136 138 138 139  K 4.5 3.9 3.5 3.5  CL 98 105 101 104  CO2 28 26 27 28   GLUCOSE 113* 93 100* 101*  BUN 22 23 33* 37*  CREATININE 1.23 1.07 1.27 1.11  CALCIUM 10.8* 9.0 9.1 8.9   Liver Function Tests: No results found for this basename: AST, ALT, ALKPHOS, BILITOT, PROT, ALBUMIN,  in the last 168 hours No results found for this basename: LIPASE, AMYLASE,  in the last 168 hours No results found for this basename: AMMONIA,  in the last 168 hours CBC:  Recent Labs Lab 01/01/13 1620 01/06/13 1300 01/06/13 1800  WBC 8.5 19.1* 20.6*  NEUTROABS 6.0 15.2* 15.5*  HGB 14.4 15.4 14.2  HCT 43.7 46.2 43.1  MCV 96.3 96.7 96.6  PLT 160 152 143*   Cardiac Enzymes: No results found for this basename: CKTOTAL, CKMB, CKMBINDEX, TROPONINI,  in the last 168 hours BNP (last 3 results) No results found for this basename: PROBNP,  in the last 8760 hours CBG: No results found for this basename: GLUCAP,  in the last 168 hours  Recent Results (from the past 240 hour(s))  MRSA PCR SCREENING     Status: None   Collection Time    01/01/13 10:35 PM      Result Value Range Status   MRSA by PCR NEGATIVE  NEGATIVE Final   Comment:            The GeneXpert MRSA Assay (FDA     approved for NASAL specimens     only), is one component of a     comprehensive MRSA colonization     surveillance program. It is not     intended to diagnose MRSA     infection nor to guide or     monitor treatment for     MRSA infections.  WOUND CULTURE     Status: None   Collection Time    01/02/13  2:42 PM      Result Value Range Status   Specimen Description WOUND RIGHT TOE   Final   Special Requests RT BABY TOE   Final   Gram Stain     Final   Value: RARE WBC PRESENT, PREDOMINANTLY PMN     NO SQUAMOUS EPITHELIAL CELLS SEEN     NO ORGANISMS SEEN     Performed at [facility]   Culture     Final   Value: FEW STAPHYLOCOCCUS SPECIES (COAGULASE NEGATIVE)     Performed at [facility]   Report Status  01/04/2013 FINAL   Final  CULTURE, BLOOD (ROUTINE X 2)     Status: None   Collection Time    01/06/13  1:00 PM      Result Value Range Status   Specimen Description BLOOD LEFT ANTECUBITAL   Final   Special Requests BOTTLES DRAWN AEROBIC AND ANAEROBIC [facility]   Final   Culture  Setup Time     Final   Value: 01/06/2013 17:01     Performed at [facility]   Culture     Final   Value:        BLOOD CULTURE RECEIVED NO GROWTH TO DATE CULTURE WILL BE HELD FOR 5 DAYS BEFORE ISSUING A FINAL NEGATIVE REPORT     Performed  at [facility]   Report Status PENDING   Incomplete  CULTURE, BLOOD (ROUTINE X 2)     Status: None   Collection Time    01/06/13  1:09 PM      Result Value Range Status   Specimen Description BLOOD LEFT HAND   Final   Special Requests BOTTLES DRAWN AEROBIC AND ANAEROBIC 5CC   Final   Culture  Setup Time     Final   Value: 01/06/2013 17:00     Performed at [facility]   Culture     Final   Value:        BLOOD CULTURE RECEIVED NO GROWTH TO DATE CULTURE WILL BE HELD FOR 5 DAYS BEFORE ISSUING A FINAL NEGATIVE REPORT     Performed at [facility]   Report Status PENDING   Incomplete  CULTURE, EXPECTORATED SPUTUM-ASSESSMENT     Status: None   Collection Time    01/06/13  3:44 PM      Result Value Range Status   Specimen Description SPUTUM   Final   Special Requests NONE   Final   Sputum evaluation     Final   Value: THIS SPECIMEN IS ACCEPTABLE. RESPIRATORY CULTURE REPORT TO FOLLOW.   Report Status 01/06/2013 FINAL   Final  CULTURE, RESPIRATORY (NON-EXPECTORATED)     Status: None   Collection Time    01/06/13  3:44 PM      Result Value Range Status   Specimen Description SPUTUM   Final   Special Requests NONE   Final   Gram Stain     Final   Value: MODERATE WBC PRESENT, PREDOMINANTLY PMN     FEW SQUAMOUS EPITHELIAL CELLS PRESENT     MODERATE GRAM VARIABLE ROD     MODERATE GRAM POSITIVE COCCI IN PAIRS     Performed at [facility]    Culture     Final   Value: NORMAL OROPHARYNGEAL FLORA     Performed at [facility]   Report Status PENDING   Incomplete     Studies: Dg Chest 2 View  01/07/2013   CLINICAL DATA:  Cough, congestion.  EXAM: CHEST  2 VIEW  COMPARISON:  01/06/2013.  FINDINGS: There is left lower lobe hazy airspace opacity. There is right lower lobe focal airspace disease. There may be a trace right pleural effusion. There is no pneumothorax. Stable cardiomediastinal silhouette. There are bilateral glenohumeral osteoarthritic changes.  IMPRESSION: Bilateral lower lobe airspace disease concerning for multifocal pneumonia.   Electronically Signed   By: Elige Ko   On: 01/07/2013 13:56   Dg Chest Port 1 View  01/07/2013   CLINICAL DATA:  Confirm line placement  EXAM: PORTABLE CHEST - 1 VIEW  COMPARISON:  January 07, 2013 at 12/1937  FINDINGS: Stable cardiac enlargement. Discoid atelectasis right lower lobe stable. Infiltrate left lower lung zones stable. Right PICC line has been placed with no pneumothorax. PICC line projects 3.0 cm into the right atrium.  IMPRESSION: PICC line tip extends into the right atrium about 3.0 cm beyond the cavoatrial junction.   Electronically Signed   By: Esperanza Heir M.D.   On: 01/07/2013 20:43   Dg Swallowing Func-speech Pathology  01/07/2013   Vivi Ferns McCoy, CCC-SLP     01/07/2013  1:06 PM Objective Swallowing Evaluation: Modified Barium Swallowing Study   Patient Details  Name: Glenn Barron MRN: [id number] Date of Birth: [birth year]  Today's Date: 01/07/2013 Time: 4098-1191 SLP Time Calculation (  min): 39 min  Past Medical History:  Past Medical History  Diagnosis Date  . Allergic rhinitis, cause unspecified   . Unspecified chronic bronchitis   . Other pulmonary embolism and infarction   . Paroxysmal a-fib   . Cerebrovascular disease, unspecified 10/2002    right side weakness  . Peripheral vascular disease, unspecified   . Unspecified venous (peripheral) insufficiency   . Acute  venous embolism and thrombosis of unspecified deep  vessels of lower extremity   . Hyperlipidemia   . GERD (gastroesophageal reflux disease)   . Diverticulosis of colon (without mention of hemorrhage)   . Irritable bowel syndrome   . Benign neoplasm of colon   . Unspecified hereditary and idiopathic peripheral neuropathy   . Depression   . Right bundle branch block   . History of scarlet fever 1944  . Aortic stenosis, mild     mild by echo 04/2011  . Heart murmur   . Hypertension     Dr. Eden Emms cardiologist ; "haven't taken anything for a couple  years since losing weight" (01/01/2013)  . DVT (deep venous thrombosis)     "put filter in to prevent" (01/01/2013)  . Asthma     "mild" (01/01/2013)  . Pneumonia     "a couple times" (01/01/2013)  . Exertional shortness of breath     "as I get older" (01/01/2013)  . Unspecified cerebral artery occlusion with cerebral infarction    . Stroke 09/2002    "right foot still drags" (01/01/2013)  . Osteoarthrosis, unspecified whether generalized or localized,  unspecified site   . Arthritis     "both ankles" (01/01/2013)  . DDD (degenerative disc disease)   . Malignant neoplasm of prostate   . Skin cancer     "off my face" (01/01/2013)   Past Surgical History:  Past Surgical History  Procedure Laterality Date  . Prostatectomy    . Cataract extraction w/ intraocular lens  implant, bilateral  Bilateral   . Carotid endarterectomy Right   . Abdominal aortic aneurysm repair    . Skin cancer excision      "left face and arms" (01/01/2013)  . Tonsillectomy and adenoidectomy    . Inguinal hernia repair Left 1991  . Inguinal hernia repair Right 1930's  . Soft tissue mass excision  1957    "off left arm and back" (01/01/2013)  . Vena cava filter placement Right    HPI:  79 yo male adm to [facility] for toe infection, during hospital course  pt demonstrated respiratory issues requiring increased  respiratory support and transfer to step down unit.  Pt has h/o  allergies, pulmonary fibrosis per CXRs  (pt denies), CVA with  dysphagia.  Pt was disoriented earlier during hospital stay- ?  contributing factor to pna, ? due to morphine and family reports  speech was difficult to understand.  BSE ordered.  Pt CXR  indicated multi-lobar pna.  Pt admits to issues with coughing up  secretions for months which he attributes to sinus issues.  He  does admit to issues with swallowing pills with them "coming back  up" approx 2- 5 minutes after he takes them - occuring frequently  over the last five months.  He also had pna in 1999 or 2000 when  he had his aortic aneurysm.  In additiion pt has required to be  patted on the back previously when choking during intake.   Suspect acute on chronic multifactorial dysphagia with  oropharyngeal and cervical esophageal issues.  Assessment / Plan / Recommendation Clinical Impression  Dysphagia Diagnosis: Moderate pharyngeal phase dysphagia;Severe  pharyngeal phase dysphagia;Mild cervical esophageal phase  dysphagia;Moderate cervical esophageal phase dysphagia Clinical impression: Patient presents with a moderate-severe  pharyngeal and cervical esophageal dysphagia characterized by  decreased laryngeal and pharyngeal strength combined with  prominent appearing CP and questionable lower esophageal deficits  resulting in decreased airway closure during the swallow and  moderate-severe pharyngeal residuals post swallowing leading to  eventual aspiration of all liquids consistencies tested.  Clinician cueing for chin tuck successful at decreasing degree of  residuals however does not fully prevent aspiration. Cues for  hard cough and dry swallow moderately successful at clearing the  airway. Suspect that dysphagia chronic in nature based on  history. Discussed with patient in length who wishes to continue  on a po diet with known risk. Dysphagia 3 (mechanical soft) diet  with nectar thick liquids and use of compensatory strategies  recommended as diet which will mitigate risk of  aspiration at  this time. SLP will f/u for education on use of strategies.     Treatment Recommendation  Therapy as outlined in treatment plan below    Diet Recommendation Dysphagia 3 (Mechanical Soft);Nectar-thick  liquid   Liquid Administration via: Cup;No straw Medication Administration: Whole meds with puree Supervision: Patient able to self feed;Full supervision/cueing  for compensatory strategies Compensations: Slow rate;Small sips/bites;Hard cough after  swallow;Multiple dry swallows after each bite/sip Postural Changes and/or Swallow Maneuvers: Seated upright 90  degrees;Upright 30-60 min after meal    Other  Recommendations Oral Care Recommendations: Oral care BID Other Recommendations: Order thickener from pharmacy;Remove water  pitcher;Prohibited food (jello, ice cream, thin soups)   Follow Up Recommendations  Home health SLP    Frequency and Duration min 2x/week  1 week        General Date of Onset: 01/06/13 HPI: 79 yo male adm to [facility] for toe infection, during hospital  course pt demonstrated respiratory issues requiring increased  respiratory support and transfer to step down unit.  Pt has h/o  allergies, pulmonary fibrosis per CXRs (pt denies), CVA with  dysphagia.  Pt was disoriented earlier during hospital stay- ?  contributing factor to pna, ? due to morphine and family reports  speech was difficult to understand.  BSE ordered.  Pt CXR  indicated multi-lobar pna.  Pt admits to issues with coughing up  secretions for months which he attributes to sinus issues.  He  does admit to issues with swallowing pills with them "coming back  up" approx 2- 5 minutes after he takes them - occuring frequently  over the last five months.  He also had pna in 1999 or 2000 when  he had his aortic aneurysm.  In additiion pt has required to be  patted on the back previously when choking during intake.   Suspect acute on chronic multifactorial dysphagia with  oropharyngeal and cervical esophageal issues.   Type of Study:  Modified Barium Swallowing Study Reason for Referral: Objectively evaluate swallowing function Previous Swallow Assessment: see HPI Diet Prior to this Study: NPO Temperature Spikes Noted: No Respiratory Status: Nasal cannula History of Recent Intubation: No Behavior/Cognition: Alert;Cooperative;Pleasant mood Oral Cavity - Dentition: Adequate natural dentition Oral Motor / Sensory Function: Within functional limits Self-Feeding Abilities: Able to feed self Patient Positioning: Upright in chair Baseline Vocal Quality: Hoarse Volitional Cough: Congested Volitional Swallow: Able to elicit Anatomy: Other (Comment) (anterior curvature of cervical spine) Pharyngeal Secretions: Not observed secondary MBS  Reason for Referral Objectively evaluate swallowing function   Oral Phase Oral Preparation/Oral Phase Oral Phase: WFL   Pharyngeal Phase Pharyngeal Phase Pharyngeal Phase: Impaired Pharyngeal - Honey Pharyngeal - Honey Cup: Delayed swallow initiation;Premature  spillage to valleculae;Reduced epiglottic inversion;Reduced  pharyngeal peristalsis;Reduced anterior laryngeal  mobility;Reduced laryngeal elevation;Reduced airway/laryngeal  closure;Reduced tongue base retraction;Penetration/Aspiration  during swallow;Penetration/Aspiration after swallow;Moderate  aspiration;Pharyngeal residue - valleculae;Pharyngeal residue -  pyriform sinuses Penetration/Aspiration details (honey cup): Material enters  airway, passes BELOW cords without attempt by patient to eject  out (silent aspiration) Pharyngeal - Nectar Pharyngeal - Nectar Cup: Delayed swallow initiation;Premature  spillage to valleculae;Reduced epiglottic inversion;Reduced  pharyngeal peristalsis;Reduced anterior laryngeal  mobility;Reduced laryngeal elevation;Reduced airway/laryngeal  closure;Reduced tongue base retraction;Penetration/Aspiration  during swallow;Penetration/Aspiration after swallow;Moderate  aspiration;Pharyngeal residue - valleculae;Pharyngeal residue -   pyriform sinuses Penetration/Aspiration details (nectar cup): Material enters  airway, passes BELOW cords without attempt by patient to eject  out (silent aspiration) Pharyngeal - Thin Pharyngeal - Thin Cup: Delayed swallow initiation;Premature  spillage to valleculae;Reduced epiglottic inversion;Reduced  pharyngeal peristalsis;Reduced anterior laryngeal  mobility;Reduced laryngeal elevation;Reduced airway/laryngeal  closure;Reduced tongue base retraction;Penetration/Aspiration  during swallow;Penetration/Aspiration after swallow;Moderate  aspiration;Pharyngeal residue - valleculae;Pharyngeal residue -  pyriform sinuses Penetration/Aspiration details (thin cup): Material enters  airway, passes BELOW cords without attempt by patient to eject  out (silent aspiration) Pharyngeal - Solids Pharyngeal - Puree: Delayed swallow initiation;Premature spillage  to valleculae;Reduced epiglottic inversion;Reduced pharyngeal  peristalsis;Reduced anterior laryngeal mobility;Reduced laryngeal  elevation;Reduced airway/laryngeal closure;Reduced tongue base  retraction;Penetration/Aspiration after swallow;Pharyngeal  residue - valleculae;Pharyngeal residue - pyriform sinuses Penetration/Aspiration details (puree): Material enters airway,  remains ABOVE vocal cords and not ejected out Pharyngeal - Mechanical Soft: Delayed swallow  initiation;Premature spillage to valleculae;Reduced epiglottic  inversion;Reduced pharyngeal peristalsis;Reduced anterior  laryngeal mobility;Reduced laryngeal elevation;Reduced  airway/laryngeal closure;Reduced tongue base  retraction;Pharyngeal residue - valleculae;Pharyngeal residue -  pyriform sinuses Penetration/Aspiration details (mechanical soft): Material does  not enter airway Pharyngeal - Pill: Delayed swallow initiation;Premature spillage  to valleculae;Reduced anterior laryngeal mobility;Reduced  epiglottic inversion;Reduced pharyngeal peristalsis;Reduced  laryngeal elevation;Reduced  airway/laryngeal closure;Reduced  tongue base retraction;Pharyngeal residue - valleculae;Pharyngeal  residue - pyriform sinuses Pharyngeal Phase - Comment Pharyngeal Comment: provided whole in pureed solids  Cervical Esophageal Phase    GO   Leah McCoy MA, CCC-SLP [phone]  Cervical Esophageal Phase Cervical Esophageal Phase: Impaired (prominent appearing CP)         McCoy Leah Meryl 01/07/2013, 1:06 PM     Scheduled Meds: . acetaminophen  650 mg Oral BID  . aspirin EC  81 mg Oral Daily  . ceFEPime (MAXIPIME) IV  1 g Intravenous Q12H  . FLORA-Q  1 capsule Oral Daily  . fluticasone  1 spray Each Nare BID  . furosemide  40 mg Intravenous Daily  . mometasone-formoterol  2 puff Inhalation BID  . predniSONE  5 mg Oral Q48H  . senna-docusate  2 tablet Oral BID  . sodium chloride  10-40 mL Intracatheter Q12H  . sodium chloride  3 mL Intravenous Q12H  . sodium chloride  3 mL Intravenous Q12H  . vancomycin  750 mg Intravenous Q12H  . warfarin  3 mg Oral ONCE-1800  . Warfarin - Pharmacist Dosing Inpatient   Does not apply q1800   Continuous Infusions: . sodium chloride         Time spent: 20 min    ELLIS,ALLISON L. ANP   [facility] Pager [phone]   If 7PM-7AM, please contact night-coverage at [email or web address], password [facility]  01/08/2013, 10:57 AM  LOS: 7 days    I have personally examined the patient and reviewed the entire database. Agree with the above note and plan as outlined, any necessary changes made.  Thomasine Klutts M.D. [facility] 01/08/2013, 4:47 PM Pager: [phone]  If 7PM-7AM, please contact night-coverage [email or web address] Password [email or web address]

## 2013-01-09 ENCOUNTER — Inpatient Hospital Stay
Admission: AD | Admit: 2013-01-09 | Discharge: 2013-03-10 | Disposition: E | Payer: Self-pay | Source: Ambulatory Visit | Attending: Internal Medicine | Admitting: Internal Medicine

## 2013-01-09 DIAGNOSIS — J9600 Acute respiratory failure, unspecified whether with hypoxia or hypercapnia: Secondary | ICD-10-CM

## 2013-01-09 DIAGNOSIS — Z7901 Long term (current) use of anticoagulants: Secondary | ICD-10-CM

## 2013-01-09 DIAGNOSIS — I10 Essential (primary) hypertension: Secondary | ICD-10-CM

## 2013-01-09 DIAGNOSIS — K219 Gastro-esophageal reflux disease without esophagitis: Secondary | ICD-10-CM

## 2013-01-09 DIAGNOSIS — J690 Pneumonitis due to inhalation of food and vomit: Secondary | ICD-10-CM

## 2013-01-09 DIAGNOSIS — M7989 Other specified soft tissue disorders: Secondary | ICD-10-CM

## 2013-01-09 LAB — CULTURE, RESPIRATORY W GRAM STAIN: Culture: NORMAL

## 2013-01-09 LAB — CBC
HCT: 39.9 % (ref 39.0–52.0)
Hemoglobin: 13 g/dL (ref 13.0–17.0)
MCH: 32.6 pg (ref 26.0–34.0)
MCV: 100 fL (ref 78.0–100.0)
Platelets: 157 10*3/uL (ref 150–400)
RBC: 3.99 MIL/uL — ABNORMAL LOW (ref 4.22–5.81)
WBC: 8.6 10*3/uL (ref 4.0–10.5)

## 2013-01-09 LAB — BASIC METABOLIC PANEL
BUN: 37 mg/dL — ABNORMAL HIGH (ref 6–23)
CO2: 29 mEq/L (ref 19–32)
Calcium: 9 mg/dL (ref 8.4–10.5)
Creatinine, Ser: 0.96 mg/dL (ref 0.50–1.35)
GFR calc Af Amer: 83 mL/min — ABNORMAL LOW (ref >90)
Glucose, Bld: 109 mg/dL — ABNORMAL HIGH (ref 70–99)

## 2013-01-09 LAB — VANCOMYCIN, TROUGH: Vancomycin Tr: 25.6 ug/mL (ref 10.0–20.0)

## 2013-01-09 LAB — CULTURE, RESPIRATORY

## 2013-01-09 MED ORDER — OXYCODONE HCL 5 MG PO TABS: 5.0000 mg | ORAL_TABLET | Freq: Four times a day (QID) | ORAL | Status: AC | PRN

## 2013-01-09 MED ORDER — DEXTROSE 5 % IV SOLN: 1.0000 g | Freq: Two times a day (BID) | INTRAVENOUS | Status: AC

## 2013-01-09 MED ORDER — WARFARIN SODIUM 2 MG PO TABS: 2.0000 mg | ORAL_TABLET | Freq: Once | ORAL | Status: AC

## 2013-01-09 MED ORDER — STARCH (THICKENING) PO POWD: 227.0000 g | ORAL | Status: AC | PRN

## 2013-01-09 MED ORDER — HEPARIN SOD (PORK) LOCK FLUSH 100 UNIT/ML IV SOLN: 250.0000 [IU] | INTRAVENOUS | Status: DC | PRN

## 2013-01-09 MED ORDER — FUROSEMIDE 10 MG/ML IJ SOLN: 40.0000 mg | Freq: Every day | INTRAMUSCULAR | Status: AC

## 2013-01-09 MED ORDER — WARFARIN SODIUM 2 MG PO TABS
2.0000 mg | ORAL_TABLET | Freq: Once | ORAL | Status: AC
  Administered 2013-01-09: 2 mg via ORAL
  Filled 2013-01-09: qty 1

## 2013-01-09 MED ORDER — BISACODYL 10 MG RE SUPP: 10.0000 mg | Freq: Every day | RECTAL | Status: AC | PRN

## 2013-01-09 MED ORDER — ENSURE COMPLETE PO LIQD: 237.0000 mL | Freq: Two times a day (BID) | ORAL | Status: DC

## 2013-01-09 MED ORDER — VANCOMYCIN HCL IN DEXTROSE 750-5 MG/150ML-% IV SOLN: 750.0000 mg | Freq: Two times a day (BID) | INTRAVENOUS | Status: AC

## 2013-01-09 MED ORDER — SENNOSIDES-DOCUSATE SODIUM 8.6-50 MG PO TABS: 2.0000 | ORAL_TABLET | Freq: Two times a day (BID) | ORAL | Status: AC

## 2013-01-09 MED ORDER — ONDANSETRON HCL 4 MG PO TABS: 4.0000 mg | ORAL_TABLET | Freq: Four times a day (QID) | ORAL | Status: AC | PRN

## 2013-01-09 NOTE — Progress Notes (Signed)
INITIAL NUTRITION ASSESSMENT  DOCUMENTATION CODES Per approved criteria  -Not Applicable   INTERVENTION: Ensure Complete po BID, each supplement provides 350 kcal and 13 grams of protein.  NUTRITION DIAGNOSIS: Inadequate oral intake related to dislike of dysphagia diet as evidenced by Meal Completion: 50%.   Goal: Pt to meet >/= 90% of their estimated nutrition needs   Monitor:  PO intake, supplement acceptance, weight trend, labs, swallow ability  Reason for Assessment: Pt identified as at nutrition risk on the Malnutrition Screen Tool  79 y.o. male  Admitting Dx: Cellulitis  ASSESSMENT: Pt admitted with cellulitis of toe. Pt reports that his appetite and weight have been normal for him PTA.  Pt states that he has a caregiver that prepares 2 meals per day for him and these are the only two meals that he eats. Breakfast is Fiber One cereal and a high fiber cereal bar, OJ and prune juice. Dinner is a meat, starch and vegetables.  Pt with ice water beside his chair which I discarded. I discussed this with the pt and his RN.  Per pt he did not know that he was having a swallowing problem at home but was coughing a lot.  Pt has muscle wasting, skin breakdown and is at very high risk for malnutrition.   Nutrition Focused Physical Exam:  Subcutaneous Fat:  Orbital Region: WNL Upper Arm Region: WNL Thoracic and Lumbar Region: WNL  Muscle:  Temple Region: severe wasting Clavicle Bone Region: severe wasting Clavicle and Acromion Bone Region: WNL Scapular Bone Region: WNL Dorsal Hand: severe wasting Patellar Region: WNL Anterior Thigh Region: WNL Posterior Calf Region: WNL  Edema: not present   Height: Ht Readings from Last 1 Encounters:  01/01/13 6\' 1"  (1.854 m)    Weight: Wt Readings from Last 1 Encounters:  01/08/13 170 lb 14.4 oz (77.52 kg)    Ideal Body Weight: 83.6 kg   % Ideal Body Weight: 93%  Wt Readings from Last 10 Encounters:  01/08/13 170 lb 14.4  oz (77.52 kg)  01/08/13 170 lb 14.4 oz (77.52 kg)  01/08/13 170 lb 14.4 oz (77.52 kg)  11/07/12 172 lb 3.2 oz (78.109 kg)  10/15/12 169 lb (76.658 kg)  07/24/12 165 lb (74.844 kg)  07/03/12 165 lb (74.844 kg)  05/01/12 175 lb 12.8 oz (79.742 kg)  03/02/12 165 lb (74.844 kg)  01/24/12 173 lb 3.2 oz (78.563 kg)    Usual Body Weight: 165-175 lb   % Usual Body Weight: >97%  BMI:  Body mass index is 22.55 kg/(m^2).  Estimated Nutritional Needs: Kcal: 1700-1900 Protein: 85-100 grams Fluid: > 1.7 L/day  Skin: stage I sacrum   Diet Order: Dysphagia 3 with Nectar Thick Liquids Meal Completion: 50%  EDUCATION NEEDS: -No education needs identified at this time   Intake/Output Summary (Last 24 hours) at 02/01/2013 1622 Last data filed at 01/18/2013 1610  Gross per 24 hour  Intake   1210 ml  Output    500 ml  Net    710 ml    Last BM: 12/2   Labs:   Recent Labs Lab 01/06/13 1300 01/07/13 0335 01/25/2013 0452  NA 138 139 144  K 3.5 3.5 4.0  CL 101 104 109  CO2 27 28 29   BUN 33* 37* 37*  CREATININE 1.27 1.11 0.96  CALCIUM 9.1 8.9 9.0  GLUCOSE 100* 101* 109*    CBG (last 3)  No results found for this basename: GLUCAP,  in the last 72 hours  Scheduled  Meds: . acetaminophen  650 mg Oral BID  . aspirin EC  81 mg Oral Daily  . ceFEPime (MAXIPIME) IV  1 g Intravenous Q12H  . FLORA-Q  1 capsule Oral Daily  . fluticasone  1 spray Each Nare BID  . furosemide  40 mg Intravenous Daily  . mometasone-formoterol  2 puff Inhalation BID  . predniSONE  5 mg Oral Q48H  . senna-docusate  2 tablet Oral BID  . sodium chloride  10-40 mL Intracatheter Q12H  . sodium chloride  3 mL Intravenous Q12H  . sodium chloride  3 mL Intravenous Q12H  . vancomycin  750 mg Intravenous Q12H  . warfarin  2 mg Oral ONCE-1800  . Warfarin - Pharmacist Dosing Inpatient   Does not apply q1800    Continuous Infusions: . sodium chloride 50 mL/hr at 01/08/13 1545    Past Medical History  Diagnosis  Date  . Allergic rhinitis, cause unspecified   . Unspecified chronic bronchitis   . Other pulmonary embolism and infarction   . Paroxysmal a-fib   . Cerebrovascular disease, unspecified 10/2002    right side weakness  . Peripheral vascular disease, unspecified   . Unspecified venous (peripheral) insufficiency   . Acute venous embolism and thrombosis of unspecified deep vessels of lower extremity   . Hyperlipidemia   . GERD (gastroesophageal reflux disease)   . Diverticulosis of colon (without mention of hemorrhage)   . Irritable bowel syndrome   . Benign neoplasm of colon   . Unspecified hereditary and idiopathic peripheral neuropathy   . Depression   . Right bundle branch block   . History of scarlet fever 1944  . Aortic stenosis, mild     mild by echo 04/2011  . Heart murmur   . Hypertension     Dr. Eden Emms cardiologist ; "haven't taken anything for a couple years since losing weight" (01/01/2013)  . DVT (deep venous thrombosis)     "put filter in to prevent" (01/01/2013)  . Asthma     "mild" (01/01/2013)  . Pneumonia     "a couple times" (01/01/2013)  . Exertional shortness of breath     "as I get older" (01/01/2013)  . Unspecified cerebral artery occlusion with cerebral infarction   . Stroke 09/2002    "right foot still drags" (01/01/2013)  . Osteoarthrosis, unspecified whether generalized or localized, unspecified site   . Arthritis     "both ankles" (01/01/2013)  . DDD (degenerative disc disease)   . Malignant neoplasm of prostate   . Skin cancer     "off my face" (01/01/2013)    Past Surgical History  Procedure Laterality Date  . Prostatectomy    . Cataract extraction w/ intraocular lens  implant, bilateral Bilateral   . Carotid endarterectomy Right   . Abdominal aortic aneurysm repair    . Skin cancer excision      "left face and arms" (01/01/2013)  . Tonsillectomy and adenoidectomy    . Inguinal hernia repair Left 1991  . Inguinal hernia repair Right 1930's   . Soft tissue mass excision  1957    "off left arm and back" (01/01/2013)  . Vena cava filter placement Right     Kendell Bane RD, LDN, CNSC [phone] Pager [phone] After Hours Pager

## 2013-01-09 NOTE — Discharge Summary (Signed)
Physician Discharge Summary  WEBB WEED OZH:[id number] DOB: [birth year] DOA: 01/01/2013  PCP: Michele Mcalpine, MD  Admit date: 01/01/2013 Discharge date: 01-14-2013  Time spent: 35 minutes  Recommendations for Outpatient Follow-up:  Patient will be discharged to [facility]. He will need to [facility]. He should continue taking vancomycin and cefepime for his hospital cord pneumonia. He should continue having his INR checked for Coumadin due to her A. fib and history of DVT. Patient also has cellulitis which continue to be monitored. Patient will need to follow up with his primary care physician once discharged from [facility].  Discharge Diagnoses:  Principal Problem:  Acute hypoxic respiratory failure secondary to HCAP versus aspiration    Osteomyelitis of small right toe with severe PVD Cellulitis Active Problems:   PROSTATE CANCER   HYPERLIPIDEMIA   HYPERTENSION   CONGESTIVE HEART FAILURE   CEREBROVASCULAR DISEASE   Edema   Aneurysm of artery of lower extremity   Atrial fibrillation   Cellulitis and abscess   Osteomyelitis of toe   Acute respiratory failure   Aspiration pneumonitis  Discharge Condition: Stable  Diet recommendation: Dysphagia 3 diet  Filed Weights   01/01/13 2037 01/08/13 0501  Weight: 74.707 kg (164 lb 11.2 oz) 77.52 kg (170 lb 14.4 oz)    History of present illness:  Glenn Barron is an 79 y.o. male lives at home with round the clock help, hx of PAF, prior PE and DVT on chronic anticoagulation, hx of PVD, mild AS, HTN, chronic lower extremity swelling, presents to the ER with increase swelling, redness, and discharge from his right foot. He has purulent discharge over the 5th toe along with increase pain. He has no fever, chills, nausea, vomiting or any acute systemic symptoms. Evalaution in the ER showed normal WBC, no fever, and normal renal fx. Hospitalist was asked to admit him for cellulitis. He said his symptoms started  about 3 days ago.  Hospital Course:  This is an 79 year old male with a history of paroxysmal atrial fibrillation, prior pulmonary embolism as well as DVT requiring anticoagulation, history of PVD as well as hypertension that presents emergency department for right foot infection and lower extremity swelling. Patient was initially admitted for similar to the right foot and placed on vancomycin as well as Cipro. DVT was obtained of the lower chimneys which was negative for DVT. Vascular surgery was consulted and recommended continuation of IV antibiotics as well as conservative management. Revascularization will be very difficult due to the occluded popliteal artery. Infectious diseases also contacted and patient may followup with their clinic as an outpatient. Patient was also noted to have acute hypoxic respiratory failure 2 questionable aspiration versus healthcare acquired pneumonia. He was placed on empiric antibiotics. Patient was also placed on a dysphagia 3 diet which was recommended by speech evaluation. Infectious disease also following the patient. Patient does have chronic atrophic fibrillation however does rate controlled. He was found to have subtherapeutic INR however pharmacy was following his INR levels. Patient is currently therapeutic at 2.96. This will continue to need monitoring and followup. Patient also has history of hypertension with moderate left ventricular hypertrophy. This did remain controlled on Lasix. Patient was not any other medications prior to admission. There was question of possible asthma. Patient is on chronic prednisone which was continued as well. Patient is noted to be very deconditioned therefore LTAC was considered. Patient will be discharged to [facility]. He should continue vancomycin and cefepime for his hospital cord pneumonia.  These antibiotics were started on 01/01/2013.  This should be continued for a total of 14 days. Patient is currently stable and understands  that he will be discharged to [facility].  Procedures: Venous duplex of the Lower extremity  Findings consistent with chronic deep vein thrombosis involving the right lower extremity. - No evidence of deep vein thrombosis involving the left lower extremity. - No evidence of superficial thombosis of the right or left lower extremity. - No evidence of Baker's cyst on the right or left.  Lower Extremity Arterial Evaluation Bilateral: ABI is not ascertained due to the posterior tibial artery being non compressible secondary to calcification. Great toe pressure indicates adequate perfusion.   Consultations: Orthopedics Vascular Infectious disease  Discharge Exam: Filed Vitals:   01/19/2013 0504  BP: 102/62  Pulse: 62  Temp: 98.3 F (36.8 C)  Resp: 16     General: Well developed, well nourished, NAD, appears stated age  HEENT: NCAT, PERRLA, EOMI, Anicteic Sclera, mucous membranes moist. No pharyngeal erythema or exudates  Neck: Supple, no JVD, no masses  Cardiovascular: S1 S2 auscultated, no rubs, murmurs or gallops. Regular rate and rhythm.  Respiratory: Clear to auscultation bilaterally with equal chest rise  Abdomen: Soft, nontender, nondistended, + bowel sounds  Extremities: warm dry without cyanosis clubbing, trace left lower extremity edema. Right lower extremity-fifth toe currently wrapped.  Neuro: AAOx3, cranial nerves grossly intact.   Psych: Normal affect and demeanor with intact judgement and insight  Discharge Instructions  Discharge Orders   Future Appointments Provider Department Dept Phone   01/17/2013 9:00 AM Gardiner Barefoot, MD [facility] [phone]   01/22/2013 3:00 PM [facility] [place] Office [phone]   03/11/2013 3:00 PM Michele Mcalpine, MD [facility] [phone]   07/23/2013 10:40 AM [facility] [place] [facility] [phone]   Patient  to arrive 15 minutes prior to appointment time. No solid food 4 hours prior to exam.  Liquids and Medicines are okay.   07/30/2013 12:15 PM Larina Earthly, MD [facility] [phone]   Future Orders Complete By Expires   Diet - low sodium heart healthy  As directed    Discharge instructions  As directed    Comments:     Patient will be discharged to [facility]. He will need [facility]. He should continue taking vancomycin and cefepime for his hospital cord pneumonia. He should continue having his INR checked for Coumadin due to her A. fib and history of DVT. Patient also has cellulitis which continue to be monitored. Patient will need to follow up with his primary care physician once discharged from [facility].   Increase activity slowly  As directed        Medication List    STOP taking these medications       furosemide 40 MG tablet  Commonly known as:  LASIX  Replaced by:  furosemide 10 MG/ML injection     KLOR-CON M20 20 MEQ tablet  Generic drug:  potassium chloride SA      TAKE these medications       acetaminophen 650 MG CR tablet  Commonly known as:  TYLENOL  Take 650 mg by mouth 2 (two) times daily.     albuterol 108 (90 BASE) MCG/ACT inhaler  Commonly known as:  PROVENTIL HFA;VENTOLIN HFA  Inhale 1-2 puffs into the lungs every 6 (six) hours as needed for  wheezing or shortness of breath.     ALIGN PO  Take 1 capsule by mouth daily.     aspirin 81 MG tablet  Take 81 mg by mouth daily.     atorvastatin 20 MG tablet  Commonly known as:  LIPITOR  take 1 tablet by mouth once daily     bisacodyl 10 MG suppository  Commonly known as:  DULCOLAX  Place 1 suppository (10 mg total) rectally daily as needed for moderate constipation.     calcium carbonate 500 MG chewable tablet  Commonly known as:  TUMS - dosed in mg elemental calcium  Chew 1-2 tablets by mouth daily as needed for indigestion or heartburn. As needed      cholecalciferol 1000 UNITS tablet  Commonly known as:  VITAMIN D  Take 1,000 Units by mouth daily.     dextrose 5 % SOLN 50 mL with ceFEPIme 1 G SOLR 1 g  Inject 1 g into the vein every 12 (twelve) hours.     famotidine 10 MG tablet  Commonly known as:  PEPCID  Take 10 mg by mouth at bedtime.     fluticasone 50 MCG/ACT nasal spray  Commonly known as:  FLONASE  instill 2 sprays into each nostril once daily     Fluticasone-Salmeterol 250-50 MCG/DOSE Aepb  Commonly known as:  ADVAIR  Inhale 1 puff into the lungs 2 (two) times daily. Rinse mouth well after use     food thickener Powd  Commonly known as:  THICK IT  Take 227 g by mouth as needed (for fluid, nectar thick).     furosemide 10 MG/ML injection  Commonly known as:  LASIX  Inject 4 mLs (40 mg total) into the vein daily.     loratadine 10 MG tablet  Commonly known as:  CLARITIN  Take 10 mg by mouth daily.     ondansetron 4 MG tablet  Commonly known as:  ZOFRAN  Take 1 tablet (4 mg total) by mouth every 6 (six) hours as needed for nausea.     oxyCODONE 5 MG immediate release tablet  Commonly known as:  Oxy IR/ROXICODONE  Take 1 tablet (5 mg total) by mouth every 6 (six) hours as needed for severe pain or breakthrough pain.     polyethylene glycol packet  Commonly known as:  MIRALAX / GLYCOLAX  Take 17 g by mouth as needed.     predniSONE 10 MG tablet  Commonly known as:  DELTASONE  Take 5 mg by mouth every other day.     senna-docusate 8.6-50 MG per tablet  Commonly known as:  Senokot-S  Take 2 tablets by mouth 2 (two) times daily.     Vancomycin 750 MG/150ML Soln  Commonly known as:  VANCOCIN  Inject 150 mLs (750 mg total) into the vein every 12 (twelve) hours.     warfarin 2 MG tablet  Commonly known as:  COUMADIN  Take 1 tablet (2 mg total) by mouth one time only at 6 PM.       Allergies  Allergen Reactions  . Doxycycline     Rash  . Amoxicillin Rash    Yeast infection        Follow-up  Information   Schedule an appointment as soon as possible for a visit with NADEL,SCOTT M, MD. (When discharged from [facility].)    Specialty:  Pulmonary Disease   Contact information:   [address] [place] [address] [phone]  The results of significant diagnostics from this hospitalization (including imaging, microbiology, ancillary and laboratory) are listed below for reference.    Significant Diagnostic Studies: Dg Chest 2 View  01/07/2013   CLINICAL DATA:  Cough, congestion.  EXAM: CHEST  2 VIEW  COMPARISON:  01/06/2013.  FINDINGS: There is left lower lobe hazy airspace opacity. There is right lower lobe focal airspace disease. There may be a trace right pleural effusion. There is no pneumothorax. Stable cardiomediastinal silhouette. There are bilateral glenohumeral osteoarthritic changes.  IMPRESSION: Bilateral lower lobe airspace disease concerning for multifocal pneumonia.   Electronically Signed   By: Elige Ko   On: 01/07/2013 13:56   Ct Angio Ao+bifem W/cm &/or Wo/cm  01/03/2013   CLINICAL DATA:  Right foot ischemia.  EXAM: CT ANGIOGRAPHY OF ABDOMINAL AORTA WITH ILIOFEMORAL RUNOFF  TECHNIQUE: Multidetector CT imaging of the abdomen, pelvis and lower extremities was performed using the standard protocol during bolus administration of intravenous contrast. Multiplanar CT image reconstructions including MIPs were obtained to evaluate the vascular anatomy.  CONTRAST:  OMNIPAQUE IOHEXOL 350 MG/ML SOLN  COMPARISON:  CTA of July 19, 2011.  FINDINGS: Aorta: Celiac artery is patent with mild atheromatous disease noted at its origin. Superior mesenteric artery is patent with mild calcified plaque at its origin. Moderate calcified atheromatous plaque is noted at the origin of the right renal artery, with similar findings seen on the left. Super renal abdominal aorta has a maximum measured AP diameter 3.4 cm consistent with mild aneurysmal dilatation. Atheromatous disease is  noted in this area. The patient is status post aortobifemoral graft placement, with right limb tied in to right common iliac artery. The left limb is attached to the left external iliac artery. The distal portion of the right common iliac artery is dilated at 3.2 cm which is not significantly changed compared to prior exam. There is retrograde filling of the left internal iliac artery with aneurysmal dilatation measuring 3.8 cm which is not significantly changed compared to prior exam.  Right Lower Extremity: Right common femoral artery is mildly dilated at 1.8 cm. CONTRAST filling is seen involving the right superficial femoral artery, which demonstrates atherosclerotic calcifications. Poor CONTRAST filling of the distal right superficial from artery is noted which may represent bolus timing issues. 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 This appears to be connected to smaller aneurysm more distally measuring 4.6 x 4.2 cm. There does not appear to be any significant contrast filling in the right popliteal or tibial arteries which may represent bolus timing issue. Subcutaneous edema is noted in the right calf.  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 There is poor contrast filling of the vessels distal to this suggesting bolus timing issue. Atherosclerotic calcifications of the left superficial femoral artery noted with stent graft seen distally passing through a large bilobed pseudoaneurysm, which is again noted involving the distal left superficial femoral artery measuring 7.6 x 6.6 cm, which is significantly improved compared to prior exam. The left popliteal and tibial arteries cannot be evaluated due to poor contrast opacification. There is a substantial amount of  subcutaneous edema present in the left calf.  Review of the MIP images confirms the above findings.  IMPRESSION: 3.4 cm suprarenal abdominal aortic aneurysmal dilatation is noted.  Status post aortobifemoral bypass graft placement ; the graft and its limbs are widely patent.  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  3.8 cm left common iliac artery aneurysm is noted which fills in retrograde manner from left external iliac artery from the bypass.  4.0 cm left common iliac artery aneurysm is noted.  There is poor contrast opacification beyond the distal right superficial femoral artery and left common femoral artery most likely due to contrast timing issue. Therefore, the patency of the vessels distal to these areas cannot be assessed. However, there is a large bilobed pseudoaneurysm measuring 5.5 x 3.9 cm involving the distal right superficial femoral artery which does demonstrate some degree of mural thrombus and appears to be patent. It is increased in size compared to prior exam. There is also noted a very large bilobed pseudoaneurysm arising from the distal left superficial femoral artery which measures 7.6 x 6.6 cm, which is smaller compared to prior exam ; there is seen a stent graft passing through this aneurysm extending to the proximal left popliteal artery. Doppler evaluation is recommended to evaluate the patency of these distal superficial femoral artery pseudoaneurysms due to the poor or lack of contrast opacification.   Electronically Signed   By: Roque Lias M.D.   On: 01/03/2013 16:18   Mr Foot Right W Wo Contrast  01/02/2013   CLINICAL DATA:  Cellulitis.  Possible osteomyelitis.  EXAM: MRI OF THE RIGHT FOREFOOT WITHOUT AND WITH CONTRAST  TECHNIQUE: Multiplanar, multisequence MR imaging was performed both before and after administration of intravenous contrast.  CONTRAST:  15mL MULTIHANCE GADOBENATE DIMEGLUMINE 529 MG/ML IV SOLN  COMPARISON:  01/01/2013 ; 10/09/2007  FINDINGS: The  middle and distal phalanges of the small toe are fused. There is osteomyelitis involving both the proximal phalanx and the fused middle/ distal phalanges of the small toe, with associated abnormal edema and enhancement in the marrow. Surrounding cellulitis in the 5th toe appears to extend into the 4th toe and into the dorsum of the foot. There is subcutaneous edema and low-grade enhancement diffusely in the soft tissues including the plantar musculature.  Lisfranc joint alignment normal. No other regions of osteomyelitis observed.  IMPRESSION: 1. Osteomyelitis of the phalanges of the small toe. Suspected diffuse forefoot cellulitis most concentrated in the small toe. Potential myositis along the plantar musculature of the forefoot. No drainable abscess.   Electronically Signed   By: Herbie Baltimore M.D.   On: 01/02/2013 11:01   Dg Chest Port 1 View  01/07/2013   CLINICAL DATA:  Confirm line placement  EXAM: PORTABLE CHEST - 1 VIEW  COMPARISON:  January 07, 2013 at 12/1937  FINDINGS: Stable cardiac enlargement. Discoid atelectasis right lower lobe stable. Infiltrate left lower lung zones stable. Right PICC line has been placed with no pneumothorax. PICC line projects 3.0 cm into the right atrium.  IMPRESSION: PICC line tip extends into the right atrium about 3.0 cm beyond the cavoatrial junction.   Electronically Signed   By: Esperanza Heir M.D.   On: 01/07/2013 20:43   Dg Chest Port 1 View  01/06/2013   CLINICAL DATA:  Respiratory distress  EXAM: PORTABLE CHEST - 1 VIEW  COMPARISON:  07/03/2012  FINDINGS: Multifocal patchy opacities, predominantly in the left upper and bilateral lower lobes, suspicious for pneumonia. Interstitial edema is considered less likely.  Linear scarring in the right lower lobe, chronic. No pleural effusion or pneumothorax.  The heart is normal in size.  IMPRESSION: Suspected  multifocal pneumonia, left upper lobe predominant, as described above.   Electronically Signed   By: Charline Bills M.D.   On: 01/06/2013 08:20   Dg Foot Complete Right  01/01/2013   CLINICAL DATA:  Right foot swelling and redness, and increased warmth, sore and pain at little toe  EXAM: RIGHT FOOT COMPLETE - 3+ VIEW  COMPARISON:  10/05/2007  FINDINGS: Marked osseous demineralization.  Diffuse soft tissue swelling.  Extensive small vessel vascular calcification.  Scattered joint space narrowing of interphalangeal joints.  Moderate-sized plantar calcaneal spur.  No acute fracture, dislocation or bone destruction.  Specifically no definite bone destruction is identified at the little toe to suggest osteomyelitis.  IMPRESSION: Marked osseous demineralization with scattered degenerative changes and extensive soft tissue swelling.  No definite acute bony findings.  If there is persistent clinical concern for osteomyelitis involving the right 5th toe, recommend MR imaging, with and without contrast if patient's renal function permits. .   Electronically Signed   By: Ulyses Southward M.D.   On: 01/01/2013 16:43   Dg Swallowing Func-speech Pathology  01/07/2013   Vivi Ferns McCoy, CCC-SLP     01/07/2013  1:06 PM Objective Swallowing Evaluation: Modified Barium Swallowing Study   Patient Details  Name: Glenn Barron MRN: [id number] Date of Birth: 08-22-1922  Today's Date: 01/07/2013 Time: 1051-1130 SLP Time Calculation (min): 39 min  Past Medical History:  Past Medical History  Diagnosis Date  . Allergic rhinitis, cause unspecified   . Unspecified chronic bronchitis   . Other pulmonary embolism and infarction   . Paroxysmal a-fib   . Cerebrovascular disease, unspecified 10/2002    right side weakness  . Peripheral vascular disease, unspecified   . Unspecified venous (peripheral) insufficiency   . Acute venous embolism and thrombosis of unspecified deep  vessels of lower extremity   . Hyperlipidemia   . GERD (gastroesophageal reflux disease)   . Diverticulosis of colon (without mention of hemorrhage)   . Irritable bowel syndrome    . Benign neoplasm of colon   . Unspecified hereditary and idiopathic peripheral neuropathy   . Depression   . Right bundle branch block   . History of scarlet fever 1944  . Aortic stenosis, mild     mild by echo 04/2011  . Heart murmur   . Hypertension     Dr. Eden Emms cardiologist ; "haven't taken anything for a couple  years since losing weight" (01/01/2013)  . DVT (deep venous thrombosis)     "put filter in to prevent" (01/01/2013)  . Asthma     "mild" (01/01/2013)  . Pneumonia     "a couple times" (01/01/2013)  . Exertional shortness of breath     "as I get older" (01/01/2013)  . Unspecified cerebral artery occlusion with cerebral infarction    . Stroke 09/2002    "right foot still drags" (01/01/2013)  . Osteoarthrosis, unspecified whether generalized or localized,  unspecified site   . Arthritis     "both ankles" (01/01/2013)  . DDD (degenerative disc disease)   . Malignant neoplasm of prostate   . Skin cancer     "off my face" (01/01/2013)   Past Surgical History:  Past Surgical History  Procedure Laterality Date  . Prostatectomy    . Cataract extraction w/ intraocular lens  implant, bilateral  Bilateral   . Carotid endarterectomy Right   . Abdominal aortic aneurysm repair    . Skin cancer excision      "left face and arms" (01/01/2013)  .  Tonsillectomy and adenoidectomy    . Inguinal hernia repair Left 1991  . Inguinal hernia repair Right 1930's  . Soft tissue mass excision  1957    "off left arm and back" (01/01/2013)  . Vena cava filter placement Right    HPI:  79 yo male adm to [facility] for toe infection, during hospital course  pt demonstrated respiratory issues requiring increased  respiratory support and transfer to step down unit.  Pt has h/o  allergies, pulmonary fibrosis per CXRs (pt denies), CVA with  dysphagia.  Pt was disoriented earlier during hospital stay- ?  contributing factor to pna, ? due to morphine and family reports  speech was difficult to understand.  BSE ordered.  Pt CXR  indicated multi-lobar  pna.  Pt admits to issues with coughing up  secretions for months which he attributes to sinus issues.  He  does admit to issues with swallowing pills with them "coming back  up" approx 2- 5 minutes after he takes them - occuring frequently  over the last five months.  He also had pna in 1999 or 2000 when  he had his aortic aneurysm.  In additiion pt has required to be  patted on the back previously when choking during intake.   Suspect acute on chronic multifactorial dysphagia with  oropharyngeal and cervical esophageal issues.       Assessment / Plan / Recommendation Clinical Impression  Dysphagia Diagnosis: Moderate pharyngeal phase dysphagia;Severe  pharyngeal phase dysphagia;Mild cervical esophageal phase  dysphagia;Moderate cervical esophageal phase dysphagia Clinical impression: Patient presents with a moderate-severe  pharyngeal and cervical esophageal dysphagia characterized by  decreased laryngeal and pharyngeal strength combined with  prominent appearing CP and questionable lower esophageal deficits  resulting in decreased airway closure during the swallow and  moderate-severe pharyngeal residuals post swallowing leading to  eventual aspiration of all liquids consistencies tested.  Clinician cueing for chin tuck successful at decreasing degree of  residuals however does not fully prevent aspiration. Cues for  hard cough and dry swallow moderately successful at clearing the  airway. Suspect that dysphagia chronic in nature based on  history. Discussed with patient in length who wishes to continue  on a po diet with known risk. Dysphagia 3 (mechanical soft) diet  with nectar thick liquids and use of compensatory strategies  recommended as diet which will mitigate risk of aspiration at  this time. SLP will f/u for education on use of strategies.     Treatment Recommendation  Therapy as outlined in treatment plan below    Diet Recommendation Dysphagia 3 (Mechanical Soft);Nectar-thick  liquid   Liquid  Administration via: Cup;No straw Medication Administration: Whole meds with puree Supervision: Patient able to self feed;Full supervision/cueing  for compensatory strategies Compensations: Slow rate;Small sips/bites;Hard cough after  swallow;Multiple dry swallows after each bite/sip Postural Changes and/or Swallow Maneuvers: Seated upright 90  degrees;Upright 30-60 min after meal    Other  Recommendations Oral Care Recommendations: Oral care BID Other Recommendations: Order thickener from pharmacy;Remove water  pitcher;Prohibited food (jello, ice cream, thin soups)   Follow Up Recommendations  Home health SLP    Frequency and Duration min 2x/week  1 week        General Date of Onset: 01/06/13 HPI: 79 yo male adm to [facility] for toe infection, during hospital  course pt demonstrated respiratory issues requiring increased  respiratory support and transfer to step down unit.  Pt has h/o  allergies, pulmonary fibrosis per CXRs (pt denies), CVA with  dysphagia.  Pt was disoriented earlier during hospital stay- ?  contributing factor to pna, ? due to morphine and family reports  speech was difficult to understand.  BSE ordered.  Pt CXR  indicated multi-lobar pna.  Pt admits to issues with coughing up  secretions for months which he attributes to sinus issues.  He  does admit to issues with swallowing pills with them "coming back  up" approx 2- 5 minutes after he takes them - occuring frequently  over the last five months.  He also had pna in 1999 or 2000 when  he had his aortic aneurysm.  In additiion pt has required to be  patted on the back previously when choking during intake.   Suspect acute on chronic multifactorial dysphagia with  oropharyngeal and cervical esophageal issues.   Type of Study: Modified Barium Swallowing Study Reason for Referral: Objectively evaluate swallowing function Previous Swallow Assessment: see HPI Diet Prior to this Study: NPO Temperature Spikes Noted: No Respiratory Status: Nasal cannula  History of Recent Intubation: No Behavior/Cognition: Alert;Cooperative;Pleasant mood Oral Cavity - Dentition: Adequate natural dentition Oral Motor / Sensory Function: Within functional limits Self-Feeding Abilities: Able to feed self Patient Positioning: Upright in chair Baseline Vocal Quality: Hoarse Volitional Cough: Congested Volitional Swallow: Able to elicit Anatomy: Other (Comment) (anterior curvature of cervical spine) Pharyngeal Secretions: Not observed secondary MBS    Reason for Referral Objectively evaluate swallowing function   Oral Phase Oral Preparation/Oral Phase Oral Phase: WFL   Pharyngeal Phase Pharyngeal Phase Pharyngeal Phase: Impaired Pharyngeal - Honey Pharyngeal - Honey Cup: Delayed swallow initiation;Premature  spillage to valleculae;Reduced epiglottic inversion;Reduced  pharyngeal peristalsis;Reduced anterior laryngeal  mobility;Reduced laryngeal elevation;Reduced airway/laryngeal  closure;Reduced tongue base retraction;Penetration/Aspiration  during swallow;Penetration/Aspiration after swallow;Moderate  aspiration;Pharyngeal residue - valleculae;Pharyngeal residue -  pyriform sinuses Penetration/Aspiration details (honey cup): Material enters  airway, passes BELOW cords without attempt by patient to eject  out (silent aspiration) Pharyngeal - Nectar Pharyngeal - Nectar Cup: Delayed swallow initiation;Premature  spillage to valleculae;Reduced epiglottic inversion;Reduced  pharyngeal peristalsis;Reduced anterior laryngeal  mobility;Reduced laryngeal elevation;Reduced airway/laryngeal  closure;Reduced tongue base retraction;Penetration/Aspiration  during swallow;Penetration/Aspiration after swallow;Moderate  aspiration;Pharyngeal residue - valleculae;Pharyngeal residue -  pyriform sinuses Penetration/Aspiration details (nectar cup): Material enters  airway, passes BELOW cords without attempt by patient to eject  out (silent aspiration) Pharyngeal - Thin Pharyngeal - Thin Cup: Delayed swallow  initiation;Premature  spillage to valleculae;Reduced epiglottic inversion;Reduced  pharyngeal peristalsis;Reduced anterior laryngeal  mobility;Reduced laryngeal elevation;Reduced airway/laryngeal  closure;Reduced tongue base retraction;Penetration/Aspiration  during swallow;Penetration/Aspiration after swallow;Moderate  aspiration;Pharyngeal residue - valleculae;Pharyngeal residue -  pyriform sinuses Penetration/Aspiration details (thin cup): Material enters  airway, passes BELOW cords without attempt by patient to eject  out (silent aspiration) Pharyngeal - Solids Pharyngeal - Puree: Delayed swallow initiation;Premature spillage  to valleculae;Reduced epiglottic inversion;Reduced pharyngeal  peristalsis;Reduced anterior laryngeal mobility;Reduced laryngeal  elevation;Reduced airway/laryngeal closure;Reduced tongue base  retraction;Penetration/Aspiration after swallow;Pharyngeal  residue - valleculae;Pharyngeal residue - pyriform sinuses Penetration/Aspiration details (puree): Material enters airway,  remains ABOVE vocal cords and not ejected out Pharyngeal - Mechanical Soft: Delayed swallow  initiation;Premature spillage to valleculae;Reduced epiglottic  inversion;Reduced pharyngeal peristalsis;Reduced anterior  laryngeal mobility;Reduced laryngeal elevation;Reduced  airway/laryngeal closure;Reduced tongue base  retraction;Pharyngeal residue - valleculae;Pharyngeal residue -  pyriform sinuses Penetration/Aspiration details (mechanical soft): Material does  not enter airway Pharyngeal - Pill: Delayed swallow initiation;Premature spillage  to valleculae;Reduced anterior laryngeal mobility;Reduced  epiglottic inversion;Reduced pharyngeal peristalsis;Reduced  laryngeal elevation;Reduced airway/laryngeal closure;Reduced  tongue base retraction;Pharyngeal residue - valleculae;Pharyngeal  residue -  pyriform sinuses Pharyngeal Phase - Comment Pharyngeal Comment: provided whole in pureed solids  Cervical Esophageal Phase     GO   Leah McCoy MA, CCC-SLP [phone]  Cervical Esophageal Phase Cervical Esophageal Phase: Impaired (prominent appearing CP)         McCoy Leah Meryl 01/07/2013, 1:06 PM     Microbiology: Recent Results (from the past 240 hour(s))  MRSA PCR SCREENING     Status: None   Collection Time    01/01/13 10:35 PM      Result Value Range Status   MRSA by PCR NEGATIVE  NEGATIVE Final   Comment:            The GeneXpert MRSA Assay (FDA     approved for NASAL specimens     only), is one component of a     comprehensive MRSA colonization     surveillance program. It is not     intended to diagnose MRSA     infection nor to guide or     monitor treatment for     MRSA infections.  WOUND CULTURE     Status: None   Collection Time    01/02/13  2:42 PM      Result Value Range Status   Specimen Description WOUND RIGHT TOE   Final   Special Requests RT BABY TOE   Final   Gram Stain     Final   Value: RARE WBC PRESENT, PREDOMINANTLY PMN     NO SQUAMOUS EPITHELIAL CELLS SEEN     NO ORGANISMS SEEN     Performed at [facility]   Culture     Final   Value: FEW STAPHYLOCOCCUS SPECIES (COAGULASE NEGATIVE)     Performed at [facility]   Report Status 01/04/2013 FINAL   Final  CULTURE, BLOOD (ROUTINE X 2)     Status: None   Collection Time    01/06/13  1:00 PM      Result Value Range Status   Specimen Description BLOOD LEFT ANTECUBITAL   Final   Special Requests BOTTLES DRAWN AEROBIC AND ANAEROBIC [facility]   Final   Culture  Setup Time     Final   Value: 01/06/2013 17:01     Performed at [facility]   Culture     Final   Value:        BLOOD CULTURE RECEIVED NO GROWTH TO DATE CULTURE WILL BE HELD FOR 5 DAYS BEFORE ISSUING A FINAL NEGATIVE REPORT     Performed at [facility]   Report Status PENDING   Incomplete  CULTURE, BLOOD (ROUTINE X 2)     Status: None   Collection Time    01/06/13  1:09 PM      Result Value Range Status   Specimen Description BLOOD LEFT  HAND   Final   Special Requests BOTTLES DRAWN AEROBIC AND ANAEROBIC 5CC   Final   Culture  Setup Time     Final   Value: 01/06/2013 17:00     Performed at [facility]   Culture     Final   Value:        BLOOD CULTURE RECEIVED NO GROWTH TO DATE CULTURE WILL BE HELD FOR 5 DAYS BEFORE ISSUING A FINAL NEGATIVE REPORT     Performed at [facility]   Report Status PENDING   Incomplete  CULTURE, EXPECTORATED SPUTUM-ASSESSMENT     Status: None   Collection Time  01/06/13  3:44 PM      Result Value Range Status   Specimen Description SPUTUM   Final   Special Requests NONE   Final   Sputum evaluation     Final   Value: THIS SPECIMEN IS ACCEPTABLE. RESPIRATORY CULTURE REPORT TO FOLLOW.   Report Status 01/06/2013 FINAL   Final  CULTURE, RESPIRATORY (NON-EXPECTORATED)     Status: None   Collection Time    01/06/13  3:44 PM      Result Value Range Status   Specimen Description SPUTUM   Final   Special Requests NONE   Final   Gram Stain     Final   Value: MODERATE WBC PRESENT, PREDOMINANTLY PMN     FEW SQUAMOUS EPITHELIAL CELLS PRESENT     MODERATE GRAM VARIABLE ROD     MODERATE GRAM POSITIVE COCCI IN PAIRS     Performed at [facility]   Culture     Final   Value: NORMAL OROPHARYNGEAL FLORA     Performed at [facility]   Report Status 01/25/2013 FINAL   Final     Labs: Basic Metabolic Panel:  Recent Labs Lab 01/03/13 0355 01/06/13 1300 01/07/13 0335 01/27/2013 0452  NA 138 138 139 144  K 3.9 3.5 3.5 4.0  CL 105 101 104 109  CO2 26 27 28 29   GLUCOSE 93 100* 101* 109*  BUN 23 33* 37* 37*  CREATININE 1.07 1.27 1.11 0.96  CALCIUM 9.0 9.1 8.9 9.0   Liver Function Tests: No results found for this basename: AST, ALT, ALKPHOS, BILITOT, PROT, ALBUMIN,  in the last 168 hours No results found for this basename: LIPASE, AMYLASE,  in the last 168 hours No results found for this basename: AMMONIA,  in the last 168 hours CBC:  Recent Labs Lab  01/06/13 1300 01/06/13 1800 01/08/13 0830 01/07/2013 0452  WBC 19.1* 20.6* 10.5 8.6  NEUTROABS 15.2* 15.5* 7.6  --   HGB 15.4 14.2 12.7* 13.0  HCT 46.2 43.1 37.4* 39.9  MCV 96.7 96.6 98.2 100.0  PLT 152 143* 137* 157   Cardiac Enzymes: No results found for this basename: CKTOTAL, CKMB, CKMBINDEX, TROPONINI,  in the last 168 hours BNP: BNP (last 3 results) No results found for this basename: PROBNP,  in the last 8760 hours CBG: No results found for this basename: GLUCAP,  in the last 168 hours     Signed:  Edsel Petrin  [facility] 01/08/2013, 11:32 AM

## 2013-01-09 NOTE — Progress Notes (Signed)
Physical Therapy Treatment Patient Details Name: Glenn Barron MRN: [id number] DOB: 1922/09/06 Today's Date: January 17, 2013 Time: 1010-1039 PT Time Calculation (min): 29 min  PT Assessment / Plan / Recommendation  History of Present Illness [name] an 79 y/o male admitted for infection of right 5th toe, which is being treated non-operatively as there is concern for healing after amputation.   PT Comments   Pt very pleasant & motivated to work hard in therapy.  Mobility limited by generalized weakness & LE numbness per pt.  Pt was able to achieve standing from bed with min (A) but then required +2 & attempting 6x's before he was able to achieve complete standing from recliner.     Follow Up Recommendations  SNF     Does the patient have the potential to tolerate intense rehabilitation     Barriers to Discharge        Equipment Recommendations  None recommended by PT    Recommendations for Other Services    Frequency Min 3X/week   Progress towards PT Goals Progress towards PT goals: Progressing toward goals  Plan Current plan remains appropriate    Precautions / Restrictions Precautions Precautions: Fall Restrictions Weight Bearing Restrictions: No   Pertinent Vitals/Pain C/o Rt LE pain but did not specify nor rate.  Repositioned for comfort.      Mobility  Bed Mobility Bed Mobility: Supine to Sit;Sitting - Scoot to Edge of Bed Supine to Sit: 4: Min assist;With rails Sitting - Scoot to Edge of Bed: 4: Min guard Details for Bed Mobility Assistance: Incr time.  (A) to elevate shoulders/trunk to sitting upright Transfers Transfers: Sit to Stand;Stand to Sit Sit to Stand: 4: Min assist;1: +2 Total assist;With upper extremity assist;With armrests;From chair/3-in-1;From bed Sit to Stand: Patient Percentage: 50% Stand to Sit: 3: Mod assist;With upper extremity assist;With armrests;To chair/3-in-1;4: Min assist Details for Transfer Assistance: cues for hand placement & technique.  Pt  able to achieve standing from bed with min (A) on 1st trial but then required +2 to stand from recliner.  Took 5 attempts before achieving complete standing position from recliner.    Ambulation/Gait Ambulation/Gait Assistance: 4: Min assist (+2 to follow with recliner) Ambulation Distance (Feet): 30 Feet Assistive device: Rolling walker Ambulation/Gait Assistance Details: (A) for balance & safety.    Cues for posture & safe use of RW.  +2 to follow with recliner as pt needed to sit quickly due to fatigue.   Gait Pattern: Step-through pattern;Decreased stride length;Trunk flexed;Shuffle Gait velocity: Decreased Stairs: No Wheelchair Mobility Wheelchair Mobility: No      PT Goals (current goals can now be found in the care plan section) Acute Rehab PT Goals PT Goal Formulation: With patient Time For Goal Achievement: 01/11/13 Potential to Achieve Goals: Good  Visit Information  Last PT Received On: 2013-01-17 Assistance Needed: +2 History of Present Illness: [name] an 79 y/o male admitted for infection of right 5th toe, which is being treated non-operatively as there is concern for healing after amputation.    Subjective Data  Subjective: "Im so tired & weak"   Cognition  Cognition Arousal/Alertness: Awake/alert Behavior During Therapy: WFL for tasks assessed/performed Overall Cognitive Status: Within Functional Limits for tasks assessed    Balance     End of Session PT - End of Session Equipment Utilized During Treatment: Gait belt Activity Tolerance: Patient limited by fatigue Patient left: in chair;with call bell/phone within reach Nurse Communication: Mobility status   GP  Glenn Barron 02/05/2013, 1:41 PM  [name], [place] [phone] 01/19/2013

## 2013-01-09 NOTE — Progress Notes (Addendum)
Patient is going to [facility] and not SNF. Clinical Social Worker will sign off for now as social work intervention is no longer needed. Please consult [place] again if new need arises.   Sabino Niemann, MSW, [employer] [phone]

## 2013-01-09 NOTE — Progress Notes (Signed)
ANTICOAGULATION CONSULT NOTE - Follow Up Consult  Pharmacy Consult for Warfarin Indication: Hx PE/DVT/Afib  Allergies  Allergen Reactions  . Doxycycline     Rash  . Amoxicillin Rash    Yeast infection     Patient Measurements: Height: 6\' 1"  (185.4 cm) Weight: 170 lb 14.4 oz (77.52 kg) IBW/kg (Calculated) : 79.9  Vital Signs: Temp: 98.3 F (36.8 C) (12/03 0504) Temp src: Oral (12/03 0504) BP: 102/62 mmHg (12/03 0504) Pulse Rate: 62 (12/03 0504)  Labs:  Recent Labs  01/06/13 1300 01/06/13 1800 01/07/13 0335 01/08/13 0515 01/08/13 0830 January 26, 2013 0452  HGB 15.4 14.2  --   --  12.7* 13.0  HCT 46.2 43.1  --   --  37.4* 39.9  PLT 152 143*  --   --  137* 157  LABPROT  --   --  21.6* 25.8*  --  29.8*  INR  --   --  1.94* 2.45*  --  2.96*  CREATININE 1.27  --  1.11  --   --  0.96    Estimated Creatinine Clearance: 58.3 ml/min (by C-G formula based on Cr of 0.96).   Assessment: 79 y.o. M who continues on warfarin for hx PE/DVT/Afib with a therapeutic INR this morning though continuing to trend up (INR 2.96 << 2.45, goal of 2-3). Hgb/Hct/Plt stable. No overt s/sx of bleeding noted. Will give reduced warfarin dose again today.   Goal of Therapy:  INR 2-3   Plan:  1. Warfarin 2 mg x 1 dose at 1800 today 2. Will continue to monitor for any signs/symptoms of bleeding and will follow up with PT/INR in the a.m.   Georgina Pillion, PharmD, BCPS Clinical Pharmacist Pager: [phone] 2013-01-26 10:55 AM

## 2013-01-09 NOTE — Care Management Note (Signed)
Case manager spent 45 minutes with patient's daughter discussing discharge plan. Daughter-Raynetta Osterloh -is not clear on where she wants her dad to go. Social Worker will also discuss options with her.Vance Peper, RN BSN

## 2013-01-10 ENCOUNTER — Other Ambulatory Visit (HOSPITAL_COMMUNITY): Payer: Self-pay

## 2013-01-10 LAB — TSH: TSH: 3.463 u[IU]/mL (ref 0.350–4.500)

## 2013-01-10 LAB — COMPREHENSIVE METABOLIC PANEL
AST: 26 U/L (ref 0–37)
Albumin: 2.3 g/dL — ABNORMAL LOW (ref 3.5–5.2)
Alkaline Phosphatase: 101 U/L (ref 39–117)
Chloride: 107 mEq/L (ref 96–112)
Creatinine, Ser: 0.91 mg/dL (ref 0.50–1.35)
Potassium: 4.3 mEq/L (ref 3.5–5.1)
Total Bilirubin: 0.7 mg/dL (ref 0.3–1.2)
Total Protein: 5.4 g/dL — ABNORMAL LOW (ref 6.0–8.3)

## 2013-01-10 LAB — CBC
HCT: 38.8 % — ABNORMAL LOW (ref 39.0–52.0)
Hemoglobin: 13 g/dL (ref 13.0–17.0)
MCV: 97.5 fL (ref 78.0–100.0)
Platelets: 170 10*3/uL (ref 150–400)
RBC: 3.98 MIL/uL — ABNORMAL LOW (ref 4.22–5.81)
WBC: 11.5 10*3/uL — ABNORMAL HIGH (ref 4.0–10.5)

## 2013-01-10 LAB — SEDIMENTATION RATE: Sed Rate: 28 mm/hr — ABNORMAL HIGH (ref 0–16)

## 2013-01-10 LAB — URINALYSIS, ROUTINE W REFLEX MICROSCOPIC
Bilirubin Urine: NEGATIVE
Hgb urine dipstick: NEGATIVE
Leukocytes, UA: NEGATIVE
Nitrite: NEGATIVE
Specific Gravity, Urine: 1.024 (ref 1.005–1.030)
pH: 5.5 (ref 5.0–8.0)

## 2013-01-10 LAB — VANCOMYCIN, TROUGH: Vancomycin Tr: 21.7 ug/mL — ABNORMAL HIGH (ref 10.0–20.0)

## 2013-01-10 LAB — PHOSPHORUS: Phosphorus: 2.3 mg/dL (ref 2.3–4.6)

## 2013-01-10 NOTE — Progress Notes (Signed)
01/10/13 Patient's daughter toured [facility] on 01/23/2013, she and  the patient decided on [facility] rather than [facility]. Patient discharged to [facility] on 01/12/2013. Jacquelynn Cree RN, [facility]  01/08/13 1400 Henrietta Mayo RN MSN BSN CCM PT recommends SNF, pt qualifies for [facility]  Discussed options with pt who states he has been @ [facility] previously.  Is interested in LTAC, requests CM talk with son and dtr.  TC to dtr and explained options and choices. She is interested in touring [facility] and talking with admissions coordinator.  TC to Driscilla Grammes who will call her and arrange tour.  Dtr states she will talk with her brother re options.

## 2013-01-11 ENCOUNTER — Other Ambulatory Visit (HOSPITAL_COMMUNITY): Payer: Self-pay

## 2013-01-11 LAB — PROTIME-INR: Prothrombin Time: 26.4 seconds — ABNORMAL HIGH (ref 11.6–15.2)

## 2013-01-11 LAB — BASIC METABOLIC PANEL
BUN: 36 mg/dL — ABNORMAL HIGH (ref 6–23)
Calcium: 9.6 mg/dL (ref 8.4–10.5)
Creatinine, Ser: 0.93 mg/dL (ref 0.50–1.35)
GFR calc Af Amer: 84 mL/min — ABNORMAL LOW (ref >90)
GFR calc non Af Amer: 73 mL/min — ABNORMAL LOW (ref >90)
Glucose, Bld: 130 mg/dL — ABNORMAL HIGH (ref 70–99)
Sodium: 146 mEq/L — ABNORMAL HIGH (ref 135–145)

## 2013-01-11 LAB — CBC
HCT: 40.7 % (ref 39.0–52.0)
Hemoglobin: 13.2 g/dL (ref 13.0–17.0)
MCH: 32.3 pg (ref 26.0–34.0)
MCHC: 32.4 g/dL (ref 30.0–36.0)

## 2013-01-11 LAB — SEDIMENTATION RATE: Sed Rate: 35 mm/hr — ABNORMAL HIGH (ref 0–16)

## 2013-01-12 LAB — BASIC METABOLIC PANEL
Chloride: 105 mEq/L (ref 96–112)
GFR calc Af Amer: 88 mL/min — ABNORMAL LOW (ref >90)
GFR calc non Af Amer: 76 mL/min — ABNORMAL LOW (ref >90)
Glucose, Bld: 94 mg/dL (ref 70–99)
Potassium: 4.2 mEq/L (ref 3.5–5.1)
Sodium: 142 mEq/L (ref 135–145)

## 2013-01-12 LAB — CULTURE, BLOOD (ROUTINE X 2): Culture: NO GROWTH

## 2013-01-12 LAB — PROTIME-INR: INR: 2.64 — ABNORMAL HIGH (ref 0.00–1.49)

## 2013-01-12 LAB — CBC
Hemoglobin: 12.6 g/dL — ABNORMAL LOW (ref 13.0–17.0)
MCHC: 32.5 g/dL (ref 30.0–36.0)
Platelets: 197 10*3/uL (ref 150–400)
RBC: 3.89 MIL/uL — ABNORMAL LOW (ref 4.22–5.81)
RDW: 14.2 % (ref 11.5–15.5)

## 2013-01-13 LAB — PROTIME-INR
INR: 2.55 — ABNORMAL HIGH (ref 0.00–1.49)
Prothrombin Time: 26.6 seconds — ABNORMAL HIGH (ref 11.6–15.2)

## 2013-01-14 ENCOUNTER — Other Ambulatory Visit (HOSPITAL_COMMUNITY): Payer: Self-pay

## 2013-01-14 LAB — URINALYSIS, ROUTINE W REFLEX MICROSCOPIC
Bilirubin Urine: NEGATIVE
Glucose, UA: NEGATIVE mg/dL
Hgb urine dipstick: NEGATIVE
Ketones, ur: NEGATIVE mg/dL
Protein, ur: NEGATIVE mg/dL
Urobilinogen, UA: 0.2 mg/dL (ref 0.0–1.0)
pH: 6 (ref 5.0–8.0)

## 2013-01-14 LAB — CBC
MCH: 32.3 pg (ref 26.0–34.0)
MCHC: 33.2 g/dL (ref 30.0–36.0)
MCV: 97.2 fL (ref 78.0–100.0)
Platelets: 258 10*3/uL (ref 150–400)
RDW: 14 % (ref 11.5–15.5)

## 2013-01-14 LAB — URINE MICROSCOPIC-ADD ON

## 2013-01-14 LAB — BASIC METABOLIC PANEL
BUN: 35 mg/dL — ABNORMAL HIGH (ref 6–23)
Calcium: 9.3 mg/dL (ref 8.4–10.5)
Chloride: 106 mEq/L (ref 96–112)
Creatinine, Ser: 0.87 mg/dL (ref 0.50–1.35)
GFR calc non Af Amer: 75 mL/min — ABNORMAL LOW (ref >90)
Sodium: 140 mEq/L (ref 135–145)

## 2013-01-14 LAB — PROTIME-INR: Prothrombin Time: 26.6 seconds — ABNORMAL HIGH (ref 11.6–15.2)

## 2013-01-15 LAB — CBC
Hemoglobin: 12.4 g/dL — ABNORMAL LOW (ref 13.0–17.0)
MCH: 32.1 pg (ref 26.0–34.0)
MCHC: 32.6 g/dL (ref 30.0–36.0)
MCV: 98.4 fL (ref 78.0–100.0)
RBC: 3.86 MIL/uL — ABNORMAL LOW (ref 4.22–5.81)
RDW: 14 % (ref 11.5–15.5)
WBC: 15.7 10*3/uL — ABNORMAL HIGH (ref 4.0–10.5)

## 2013-01-15 LAB — BASIC METABOLIC PANEL
BUN: 38 mg/dL — ABNORMAL HIGH (ref 6–23)
CO2: 27 mEq/L (ref 19–32)
Calcium: 9.1 mg/dL (ref 8.4–10.5)
Creatinine, Ser: 0.86 mg/dL (ref 0.50–1.35)
GFR calc non Af Amer: 75 mL/min — ABNORMAL LOW (ref >90)
Glucose, Bld: 97 mg/dL (ref 70–99)
Sodium: 142 mEq/L (ref 135–145)

## 2013-01-15 LAB — SEDIMENTATION RATE: Sed Rate: 39 mm/hr — ABNORMAL HIGH (ref 0–16)

## 2013-01-15 LAB — PROTIME-INR: Prothrombin Time: 25.8 seconds — ABNORMAL HIGH (ref 11.6–15.2)

## 2013-01-16 LAB — CBC
HCT: 38.2 % — ABNORMAL LOW (ref 39.0–52.0)
Hemoglobin: 12.6 g/dL — ABNORMAL LOW (ref 13.0–17.0)
MCH: 32.8 pg (ref 26.0–34.0)
MCHC: 33 g/dL (ref 30.0–36.0)
MCV: 99.5 fL (ref 78.0–100.0)
RBC: 3.84 MIL/uL — ABNORMAL LOW (ref 4.22–5.81)

## 2013-01-16 LAB — BASIC METABOLIC PANEL
BUN: 40 mg/dL — ABNORMAL HIGH (ref 6–23)
CO2: 27 mEq/L (ref 19–32)
Chloride: 108 mEq/L (ref 96–112)
GFR calc non Af Amer: 72 mL/min — ABNORMAL LOW (ref >90)
Glucose, Bld: 72 mg/dL (ref 70–99)
Potassium: 3.7 mEq/L (ref 3.5–5.1)
Sodium: 144 mEq/L (ref 135–145)

## 2013-01-16 LAB — URINE CULTURE

## 2013-01-17 ENCOUNTER — Inpatient Hospital Stay: Payer: Medicare Other | Admitting: Internal Medicine

## 2013-01-17 LAB — PROTIME-INR: Prothrombin Time: 30.9 seconds — ABNORMAL HIGH (ref 11.6–15.2)

## 2013-01-18 ENCOUNTER — Other Ambulatory Visit (HOSPITAL_COMMUNITY): Payer: Self-pay

## 2013-01-18 LAB — EXPECTORATED SPUTUM ASSESSMENT W REFEX TO RESP CULTURE

## 2013-01-18 LAB — BASIC METABOLIC PANEL
BUN: 43 mg/dL — ABNORMAL HIGH (ref 6–23)
CO2: 28 mEq/L (ref 19–32)
Chloride: 111 mEq/L (ref 96–112)
Creatinine, Ser: 1.08 mg/dL (ref 0.50–1.35)
GFR calc Af Amer: 69 mL/min — ABNORMAL LOW (ref >90)
GFR calc non Af Amer: 59 mL/min — ABNORMAL LOW (ref >90)

## 2013-01-18 LAB — CBC WITH DIFFERENTIAL/PLATELET
Basophils Absolute: 0 10*3/uL (ref 0.0–0.1)
Eosinophils Absolute: 0.2 10*3/uL (ref 0.0–0.7)
HCT: 40.9 % (ref 39.0–52.0)
Lymphocytes Relative: 8 % — ABNORMAL LOW (ref 12–46)
Lymphs Abs: 1.9 10*3/uL (ref 0.7–4.0)
MCHC: 32.3 g/dL (ref 30.0–36.0)
MCV: 100.2 fL — ABNORMAL HIGH (ref 78.0–100.0)
Monocytes Absolute: 1.7 10*3/uL — ABNORMAL HIGH (ref 0.1–1.0)
Neutro Abs: 20.4 10*3/uL — ABNORMAL HIGH (ref 1.7–7.7)
Neutrophils Relative %: 84 % — ABNORMAL HIGH (ref 43–77)
RDW: 14.4 % (ref 11.5–15.5)

## 2013-01-18 LAB — URINALYSIS, ROUTINE W REFLEX MICROSCOPIC
Bilirubin Urine: NEGATIVE
Hgb urine dipstick: NEGATIVE
Ketones, ur: NEGATIVE mg/dL
Protein, ur: NEGATIVE mg/dL
Urobilinogen, UA: 0.2 mg/dL (ref 0.0–1.0)

## 2013-01-18 LAB — PROTIME-INR: Prothrombin Time: 32.1 seconds — ABNORMAL HIGH (ref 11.6–15.2)

## 2013-01-18 LAB — URINE MICROSCOPIC-ADD ON

## 2013-01-18 LAB — CLOSTRIDIUM DIFFICILE BY PCR: Toxigenic C. Difficile by PCR: NEGATIVE

## 2013-01-18 LAB — EXPECTORATED SPUTUM ASSESSMENT W GRAM STAIN, RFLX TO RESP C

## 2013-01-19 LAB — CBC WITH DIFFERENTIAL/PLATELET
Basophils Absolute: 0 10*3/uL (ref 0.0–0.1)
Basophils Relative: 0 % (ref 0–1)
Eosinophils Absolute: 0.2 10*3/uL (ref 0.0–0.7)
Hemoglobin: 13 g/dL (ref 13.0–17.0)
Lymphocytes Relative: 10 % — ABNORMAL LOW (ref 12–46)
MCH: 32.8 pg (ref 26.0–34.0)
MCHC: 33 g/dL (ref 30.0–36.0)
Monocytes Absolute: 1.4 10*3/uL — ABNORMAL HIGH (ref 0.1–1.0)
Monocytes Relative: 8 % (ref 3–12)
Neutro Abs: 14.4 10*3/uL — ABNORMAL HIGH (ref 1.7–7.7)
Platelets: 358 10*3/uL (ref 150–400)
RDW: 14.4 % (ref 11.5–15.5)
WBC: 17.9 10*3/uL — ABNORMAL HIGH (ref 4.0–10.5)

## 2013-01-19 LAB — BASIC METABOLIC PANEL
CO2: 30 mEq/L (ref 19–32)
Calcium: 9.8 mg/dL (ref 8.4–10.5)
Chloride: 115 mEq/L — ABNORMAL HIGH (ref 96–112)
Creatinine, Ser: 1.22 mg/dL (ref 0.50–1.35)
GFR calc Af Amer: 59 mL/min — ABNORMAL LOW (ref >90)
Sodium: 153 mEq/L — ABNORMAL HIGH (ref 135–145)

## 2013-01-19 LAB — PROTIME-INR
INR: 3.48 — ABNORMAL HIGH (ref 0.00–1.49)
Prothrombin Time: 33.7 seconds — ABNORMAL HIGH (ref 11.6–15.2)

## 2013-01-20 LAB — BASIC METABOLIC PANEL
BUN: 43 mg/dL — ABNORMAL HIGH (ref 6–23)
Chloride: 112 mEq/L (ref 96–112)
Creatinine, Ser: 1.18 mg/dL (ref 0.50–1.35)
GFR calc non Af Amer: 53 mL/min — ABNORMAL LOW (ref >90)
Glucose, Bld: 97 mg/dL (ref 70–99)
Potassium: 4.3 mEq/L (ref 3.5–5.1)
Sodium: 145 mEq/L (ref 135–145)

## 2013-01-20 LAB — PROTIME-INR
INR: 3.1 — ABNORMAL HIGH (ref 0.00–1.49)
Prothrombin Time: 30.8 seconds — ABNORMAL HIGH (ref 11.6–15.2)

## 2013-01-20 LAB — URINE CULTURE

## 2013-01-20 LAB — CBC WITH DIFFERENTIAL/PLATELET
Basophils Absolute: 0 10*3/uL (ref 0.0–0.1)
Eosinophils Absolute: 0.2 10*3/uL (ref 0.0–0.7)
Hemoglobin: 12.2 g/dL — ABNORMAL LOW (ref 13.0–17.0)
Lymphocytes Relative: 15 % (ref 12–46)
Lymphs Abs: 2 10*3/uL (ref 0.7–4.0)
Monocytes Relative: 10 % (ref 3–12)
Neutro Abs: 9.9 10*3/uL — ABNORMAL HIGH (ref 1.7–7.7)
Neutrophils Relative %: 73 % (ref 43–77)
Platelets: 340 10*3/uL (ref 150–400)
RBC: 3.73 MIL/uL — ABNORMAL LOW (ref 4.22–5.81)
WBC: 13.5 10*3/uL — ABNORMAL HIGH (ref 4.0–10.5)

## 2013-01-21 LAB — CBC WITH DIFFERENTIAL/PLATELET
Basophils Absolute: 0 10*3/uL (ref 0.0–0.1)
Basophils Relative: 0 % (ref 0–1)
Eosinophils Absolute: 0.3 10*3/uL (ref 0.0–0.7)
Eosinophils Relative: 2 % (ref 0–5)
HCT: 40.8 % (ref 39.0–52.0)
Lymphocytes Relative: 17 % (ref 12–46)
Lymphs Abs: 2.7 10*3/uL (ref 0.7–4.0)
MCH: 32.5 pg (ref 26.0–34.0)
MCHC: 32.4 g/dL (ref 30.0–36.0)
MCV: 100.5 fL — ABNORMAL HIGH (ref 78.0–100.0)
Monocytes Absolute: 1.4 10*3/uL — ABNORMAL HIGH (ref 0.1–1.0)
Monocytes Relative: 9 % (ref 3–12)
Neutro Abs: 11.8 10*3/uL — ABNORMAL HIGH (ref 1.7–7.7)
Platelets: 338 10*3/uL (ref 150–400)
RBC: 4.06 MIL/uL — ABNORMAL LOW (ref 4.22–5.81)
RDW: 14.7 % (ref 11.5–15.5)
WBC: 16.3 10*3/uL — ABNORMAL HIGH (ref 4.0–10.5)

## 2013-01-21 LAB — CULTURE, RESPIRATORY W GRAM STAIN

## 2013-01-21 LAB — COMPREHENSIVE METABOLIC PANEL
ALT: 22 U/L (ref 0–53)
AST: 31 U/L (ref 0–37)
Alkaline Phosphatase: 119 U/L — ABNORMAL HIGH (ref 39–117)
CO2: 27 mEq/L (ref 19–32)
Chloride: 111 mEq/L (ref 96–112)
Creatinine, Ser: 1.29 mg/dL (ref 0.50–1.35)
GFR calc Af Amer: 55 mL/min — ABNORMAL LOW (ref >90)
GFR calc non Af Amer: 48 mL/min — ABNORMAL LOW (ref >90)
Glucose, Bld: 84 mg/dL (ref 70–99)
Sodium: 147 mEq/L — ABNORMAL HIGH (ref 135–145)
Total Bilirubin: 0.5 mg/dL (ref 0.3–1.2)

## 2013-01-21 LAB — CULTURE, RESPIRATORY

## 2013-01-21 LAB — PREALBUMIN: Prealbumin: 9.9 mg/dL — ABNORMAL LOW (ref 17.0–34.0)

## 2013-01-21 LAB — PROTIME-INR: INR: 1.94 — ABNORMAL HIGH (ref 0.00–1.49)

## 2013-01-22 LAB — CBC
HCT: 39.8 % (ref 39.0–52.0)
Hemoglobin: 12.9 g/dL — ABNORMAL LOW (ref 13.0–17.0)
MCHC: 32.4 g/dL (ref 30.0–36.0)
MCV: 99.3 fL (ref 78.0–100.0)
Platelets: 308 10*3/uL (ref 150–400)
RBC: 4.01 MIL/uL — ABNORMAL LOW (ref 4.22–5.81)
WBC: 17.4 10*3/uL — ABNORMAL HIGH (ref 4.0–10.5)

## 2013-01-22 LAB — BASIC METABOLIC PANEL
BUN: 40 mg/dL — ABNORMAL HIGH (ref 6–23)
CO2: 25 mEq/L (ref 19–32)
Chloride: 109 mEq/L (ref 96–112)
Creatinine, Ser: 1.32 mg/dL (ref 0.50–1.35)
GFR calc non Af Amer: 46 mL/min — ABNORMAL LOW (ref >90)
Glucose, Bld: 92 mg/dL (ref 70–99)
Sodium: 144 mEq/L (ref 135–145)

## 2013-01-22 LAB — PROTIME-INR: Prothrombin Time: 21.2 seconds — ABNORMAL HIGH (ref 11.6–15.2)

## 2013-01-23 LAB — PROTIME-INR: Prothrombin Time: 23.4 seconds — ABNORMAL HIGH (ref 11.6–15.2)

## 2013-01-24 LAB — CBC WITH DIFFERENTIAL/PLATELET
Basophils Absolute: 0 10*3/uL (ref 0.0–0.1)
Basophils Relative: 0 % (ref 0–1)
Eosinophils Relative: 1 % (ref 0–5)
HCT: 39.5 % (ref 39.0–52.0)
Lymphocytes Relative: 17 % (ref 12–46)
MCV: 97.8 fL (ref 78.0–100.0)
Monocytes Absolute: 1.8 10*3/uL — ABNORMAL HIGH (ref 0.1–1.0)
Neutro Abs: 13.7 10*3/uL — ABNORMAL HIGH (ref 1.7–7.7)
RBC: 4.04 MIL/uL — ABNORMAL LOW (ref 4.22–5.81)
RDW: 14.7 % (ref 11.5–15.5)
WBC: 18.9 10*3/uL — ABNORMAL HIGH (ref 4.0–10.5)

## 2013-01-24 LAB — BASIC METABOLIC PANEL
CO2: 27 mEq/L (ref 19–32)
Chloride: 108 mEq/L (ref 96–112)
Creatinine, Ser: 1.29 mg/dL (ref 0.50–1.35)
GFR calc Af Amer: 55 mL/min — ABNORMAL LOW (ref >90)
Sodium: 142 mEq/L (ref 135–145)

## 2013-01-24 LAB — PROTIME-INR: INR: 2.5 — ABNORMAL HIGH (ref 0.00–1.49)

## 2013-01-25 LAB — PROTIME-INR
INR: 2.38 — ABNORMAL HIGH (ref 0.00–1.49)
Prothrombin Time: 25.2 seconds — ABNORMAL HIGH (ref 11.6–15.2)

## 2013-01-26 ENCOUNTER — Other Ambulatory Visit (HOSPITAL_COMMUNITY): Payer: Self-pay

## 2013-01-26 LAB — BASIC METABOLIC PANEL
BUN: 41 mg/dL — ABNORMAL HIGH (ref 6–23)
Chloride: 108 mEq/L (ref 96–112)
Creatinine, Ser: 1.35 mg/dL (ref 0.50–1.35)
GFR calc non Af Amer: 45 mL/min — ABNORMAL LOW (ref >90)
Glucose, Bld: 86 mg/dL (ref 70–99)
Potassium: 4.5 mEq/L (ref 3.5–5.1)

## 2013-01-26 LAB — CBC
HCT: 39.9 % (ref 39.0–52.0)
Hemoglobin: 13.1 g/dL (ref 13.0–17.0)
MCH: 32.3 pg (ref 26.0–34.0)
MCHC: 32.8 g/dL (ref 30.0–36.0)
MCV: 98.3 fL (ref 78.0–100.0)
Platelets: 243 10*3/uL (ref 150–400)

## 2013-01-26 LAB — PROTIME-INR
INR: 2.59 — ABNORMAL HIGH (ref 0.00–1.49)
Prothrombin Time: 26.9 seconds — ABNORMAL HIGH (ref 11.6–15.2)

## 2013-01-27 LAB — URINE MICROSCOPIC-ADD ON

## 2013-01-27 LAB — EXPECTORATED SPUTUM ASSESSMENT W GRAM STAIN, RFLX TO RESP C

## 2013-01-27 LAB — CBC WITH DIFFERENTIAL/PLATELET
Eosinophils Absolute: 0.2 10*3/uL (ref 0.0–0.7)
Eosinophils Relative: 1 % (ref 0–5)
HCT: 41.2 % (ref 39.0–52.0)
Lymphocytes Relative: 16 % (ref 12–46)
Lymphs Abs: 3.1 10*3/uL (ref 0.7–4.0)
MCH: 32.3 pg (ref 26.0–34.0)
MCV: 98.6 fL (ref 78.0–100.0)
Monocytes Absolute: 2.1 10*3/uL — ABNORMAL HIGH (ref 0.1–1.0)
Monocytes Relative: 10 % (ref 3–12)
Neutro Abs: 14.5 10*3/uL — ABNORMAL HIGH (ref 1.7–7.7)
Platelets: 232 10*3/uL (ref 150–400)
RBC: 4.18 MIL/uL — ABNORMAL LOW (ref 4.22–5.81)
WBC: 20 10*3/uL — ABNORMAL HIGH (ref 4.0–10.5)

## 2013-01-27 LAB — URINALYSIS, ROUTINE W REFLEX MICROSCOPIC
Bilirubin Urine: NEGATIVE
Hgb urine dipstick: NEGATIVE
Protein, ur: NEGATIVE mg/dL
Specific Gravity, Urine: 1.025 (ref 1.005–1.030)
Urobilinogen, UA: 0.2 mg/dL (ref 0.0–1.0)
pH: 5.5 (ref 5.0–8.0)

## 2013-01-27 LAB — BASIC METABOLIC PANEL
BUN: 37 mg/dL — ABNORMAL HIGH (ref 6–23)
CO2: 25 mEq/L (ref 19–32)
Calcium: 10.3 mg/dL (ref 8.4–10.5)
Creatinine, Ser: 1.24 mg/dL (ref 0.50–1.35)
Glucose, Bld: 84 mg/dL (ref 70–99)

## 2013-01-28 ENCOUNTER — Other Ambulatory Visit (HOSPITAL_COMMUNITY): Payer: Self-pay

## 2013-01-28 LAB — URINE CULTURE: Colony Count: 55000

## 2013-01-28 LAB — BASIC METABOLIC PANEL
BUN: 37 mg/dL — ABNORMAL HIGH (ref 6–23)
CO2: 24 mEq/L (ref 19–32)
Calcium: 10.4 mg/dL (ref 8.4–10.5)
Chloride: 108 mEq/L (ref 96–112)
GFR calc non Af Amer: 55 mL/min — ABNORMAL LOW (ref >90)
Glucose, Bld: 91 mg/dL (ref 70–99)
Potassium: 4.6 mEq/L (ref 3.5–5.1)
Sodium: 141 mEq/L (ref 135–145)

## 2013-01-28 LAB — SEDIMENTATION RATE: Sed Rate: 60 mm/hr — ABNORMAL HIGH (ref 0–16)

## 2013-01-28 LAB — CBC
Hemoglobin: 13.6 g/dL (ref 13.0–17.0)
MCH: 32.2 pg (ref 26.0–34.0)
RBC: 4.23 MIL/uL (ref 4.22–5.81)

## 2013-01-28 LAB — C-REACTIVE PROTEIN: CRP: 12.3 mg/dL — ABNORMAL HIGH (ref <0.60)

## 2013-01-28 LAB — PROTIME-INR
INR: 2.39 — ABNORMAL HIGH (ref 0.00–1.49)
Prothrombin Time: 25.3 seconds — ABNORMAL HIGH (ref 11.6–15.2)

## 2013-01-29 LAB — CBC
MCH: 31.1 pg (ref 26.0–34.0)
Platelets: 227 10*3/uL (ref 150–400)
RBC: 4.24 MIL/uL (ref 4.22–5.81)
WBC: 17.1 10*3/uL — ABNORMAL HIGH (ref 4.0–10.5)

## 2013-01-29 LAB — PROTIME-INR
INR: 2.68 — ABNORMAL HIGH (ref 0.00–1.49)
Prothrombin Time: 27.6 seconds — ABNORMAL HIGH (ref 11.6–15.2)

## 2013-01-29 LAB — BASIC METABOLIC PANEL
BUN: 33 mg/dL — ABNORMAL HIGH (ref 6–23)
CO2: 22 mEq/L (ref 19–32)
Calcium: 10.4 mg/dL (ref 8.4–10.5)
GFR calc Af Amer: 66 mL/min — ABNORMAL LOW (ref >90)
Glucose, Bld: 73 mg/dL (ref 70–99)
Potassium: 4.7 mEq/L (ref 3.5–5.1)
Sodium: 144 mEq/L (ref 135–145)

## 2013-01-29 LAB — CULTURE, RESPIRATORY W GRAM STAIN

## 2013-01-30 LAB — BASIC METABOLIC PANEL
Calcium: 10.2 mg/dL (ref 8.4–10.5)
Chloride: 110 mEq/L (ref 96–112)
GFR calc Af Amer: 60 mL/min — ABNORMAL LOW (ref >90)
GFR calc non Af Amer: 52 mL/min — ABNORMAL LOW (ref >90)
Glucose, Bld: 90 mg/dL (ref 70–99)
Potassium: 4.7 mEq/L (ref 3.5–5.1)
Sodium: 145 mEq/L (ref 135–145)

## 2013-01-30 LAB — CBC
HCT: 40.5 % (ref 39.0–52.0)
Hemoglobin: 13.4 g/dL (ref 13.0–17.0)
MCHC: 33.1 g/dL (ref 30.0–36.0)
RDW: 15 % (ref 11.5–15.5)
WBC: 16.7 10*3/uL — ABNORMAL HIGH (ref 4.0–10.5)

## 2013-01-30 LAB — PROTIME-INR
INR: 3.31 — ABNORMAL HIGH (ref 0.00–1.49)
Prothrombin Time: 32.4 seconds — ABNORMAL HIGH (ref 11.6–15.2)

## 2013-01-31 LAB — PROTIME-INR
INR: 3.62 — ABNORMAL HIGH (ref 0.00–1.49)
Prothrombin Time: 34.7 seconds — ABNORMAL HIGH (ref 11.6–15.2)

## 2013-01-31 LAB — CBC
Platelets: 268 10*3/uL (ref 150–400)
RDW: 15 % (ref 11.5–15.5)
WBC: 16.7 10*3/uL — ABNORMAL HIGH (ref 4.0–10.5)

## 2013-01-31 LAB — BASIC METABOLIC PANEL
Chloride: 109 mEq/L (ref 96–112)
GFR calc Af Amer: 62 mL/min — ABNORMAL LOW (ref >90)
Potassium: 4.1 mEq/L (ref 3.5–5.1)
Sodium: 144 mEq/L (ref 135–145)

## 2013-02-01 ENCOUNTER — Other Ambulatory Visit (HOSPITAL_COMMUNITY): Payer: Self-pay

## 2013-02-01 LAB — BASIC METABOLIC PANEL
BUN: 32 mg/dL — ABNORMAL HIGH (ref 6–23)
CO2: 20 mEq/L (ref 19–32)
Calcium: 10 mg/dL (ref 8.4–10.5)
Chloride: 111 mEq/L (ref 96–112)
Potassium: 4.3 mEq/L (ref 3.5–5.1)
Sodium: 142 mEq/L (ref 135–145)

## 2013-02-01 LAB — CBC
Platelets: 284 10*3/uL (ref 150–400)
RBC: 4.32 MIL/uL (ref 4.22–5.81)
WBC: 18.8 10*3/uL — ABNORMAL HIGH (ref 4.0–10.5)

## 2013-02-01 LAB — PROTIME-INR
INR: 3.7 — ABNORMAL HIGH (ref 0.00–1.49)
Prothrombin Time: 35.3 seconds — ABNORMAL HIGH (ref 11.6–15.2)

## 2013-02-02 ENCOUNTER — Encounter: Payer: Self-pay | Admitting: Radiology

## 2013-02-02 ENCOUNTER — Other Ambulatory Visit (HOSPITAL_COMMUNITY): Payer: Self-pay

## 2013-02-02 LAB — CBC WITH DIFFERENTIAL/PLATELET
Basophils Absolute: 0 10*3/uL (ref 0.0–0.1)
HCT: 42 % (ref 39.0–52.0)
Lymphocytes Relative: 19 % (ref 12–46)
Lymphs Abs: 4.1 10*3/uL — ABNORMAL HIGH (ref 0.7–4.0)
MCHC: 33.3 g/dL (ref 30.0–36.0)
MCV: 97.7 fL (ref 78.0–100.0)
Monocytes Absolute: 2.2 10*3/uL — ABNORMAL HIGH (ref 0.1–1.0)
Neutro Abs: 15.3 10*3/uL — ABNORMAL HIGH (ref 1.7–7.7)
Neutrophils Relative %: 69 % (ref 43–77)
Platelets: 301 10*3/uL (ref 150–400)
RBC: 4.3 MIL/uL (ref 4.22–5.81)
RDW: 14.9 % (ref 11.5–15.5)
WBC: 22.1 10*3/uL — ABNORMAL HIGH (ref 4.0–10.5)

## 2013-02-02 LAB — PROTIME-INR
INR: 1.58 — ABNORMAL HIGH (ref 0.00–1.49)
Prothrombin Time: 18.4 seconds — ABNORMAL HIGH (ref 11.6–15.2)

## 2013-02-02 NOTE — H&P (Signed)
Referring Physician: Dr. Sharyon Medicus HPI: Glenn Barron is an 79 y.o. male who presented 11/25 with right foot swelling and redness, found to have cellulitis and osteomyelitis of the 5th digit. The patient has been on multiple antibiotic regimen and also treated for possible aspiration versus HCAP. His oxygen saturation is 98% on 3L. He underwent swallow evaluation and is on dysphagia diet with risk for aspiration and with malnutrition concern. IR received request for image guided percutaneous gastric tube placement. Patient has PAF and on chronic coumadin. This has been held and vitamin K has been given in preparation of procedure. We will plan for gastric tube Monday 12/29 if patient's INR < 1.5, afebrile and cbc trends down. Patient with NGT in place now.   Past Medical History:  Past Medical History  Diagnosis Date  . Allergic rhinitis, cause unspecified   . Unspecified chronic bronchitis   . Other pulmonary embolism and infarction   . Paroxysmal a-fib   . Cerebrovascular disease, unspecified 10/2002    right side weakness  . Peripheral vascular disease, unspecified   . Unspecified venous (peripheral) insufficiency   . Acute venous embolism and thrombosis of unspecified deep vessels of lower extremity   . Hyperlipidemia   . GERD (gastroesophageal reflux disease)   . Diverticulosis of colon (without mention of hemorrhage)   . Irritable bowel syndrome   . Benign neoplasm of colon   . Unspecified hereditary and idiopathic peripheral neuropathy   . Depression   . Right bundle branch block   . History of scarlet fever 1944  . Aortic stenosis, mild     mild by echo 04/2011  . Heart murmur   . Hypertension     Dr. Eden Emms cardiologist ; "haven't taken anything for a couple years since losing weight" (01/01/2013)  . DVT (deep venous thrombosis)     "put filter in to prevent" (01/01/2013)  . Asthma     "mild" (01/01/2013)  . Pneumonia     "a couple times" (01/01/2013)  . Exertional shortness  of breath     "as I get older" (01/01/2013)  . Unspecified cerebral artery occlusion with cerebral infarction   . Stroke 09/2002    "right foot still drags" (01/01/2013)  . Osteoarthrosis, unspecified whether generalized or localized, unspecified site   . Arthritis     "both ankles" (01/01/2013)  . DDD (degenerative disc disease)   . Malignant neoplasm of prostate   . Skin cancer     "off my face" (01/01/2013)    Past Surgical History:  Past Surgical History  Procedure Laterality Date  . Prostatectomy    . Cataract extraction w/ intraocular lens  implant, bilateral Bilateral   . Carotid endarterectomy Right   . Abdominal aortic aneurysm repair    . Skin cancer excision      "left face and arms" (01/01/2013)  . Tonsillectomy and adenoidectomy    . Inguinal hernia repair Left 1991  . Inguinal hernia repair Right 1930's  . Soft tissue mass excision  1957    "off left arm and back" (01/01/2013)  . Vena cava filter placement Right     Family History:  Family History  Problem Relation Age of Onset  . Anesthesia problems Neg Hx   . Heart attack Father   . Hyperlipidemia Mother   . Hypertension Mother   . Hypertension Sister     Social History:  reports that he has never smoked. He has never used smokeless tobacco. He reports that he drinks alcohol.  He reports that he does not use illicit drugs.  Allergies:  Allergies  Allergen Reactions  . Doxycycline     Rash  . Amoxicillin Rash    Yeast infection       Medication List    ASK your doctor about these medications       acetaminophen 650 MG CR tablet  Commonly known as:  TYLENOL  Take 650 mg by mouth 2 (two) times daily.     albuterol 108 (90 BASE) MCG/ACT inhaler  Commonly known as:  PROVENTIL HFA;VENTOLIN HFA  Inhale 1-2 puffs into the lungs every 6 (six) hours as needed for wheezing or shortness of breath.     ALIGN PO  Take 1 capsule by mouth daily.     aspirin 81 MG tablet  Take 81 mg by mouth daily.      atorvastatin 20 MG tablet  Commonly known as:  LIPITOR  take 1 tablet by mouth once daily     bisacodyl 10 MG suppository  Commonly known as:  DULCOLAX  Place 1 suppository (10 mg total) rectally daily as needed for moderate constipation.     calcium carbonate 500 MG chewable tablet  Commonly known as:  TUMS - dosed in mg elemental calcium  Chew 1-2 tablets by mouth daily as needed for indigestion or heartburn. As needed     cholecalciferol 1000 UNITS tablet  Commonly known as:  VITAMIN D  Take 1,000 Units by mouth daily.     dextrose 5 % SOLN 50 mL with ceFEPIme 1 G SOLR 1 g  Inject 1 g into the vein every 12 (twelve) hours.     famotidine 10 MG tablet  Commonly known as:  PEPCID  Take 10 mg by mouth at bedtime.     fluticasone 50 MCG/ACT nasal spray  Commonly known as:  FLONASE  instill 2 sprays into each nostril once daily     Fluticasone-Salmeterol 250-50 MCG/DOSE Aepb  Commonly known as:  ADVAIR  Inhale 1 puff into the lungs 2 (two) times daily. Rinse mouth well after use     food thickener Powd  Commonly known as:  THICK IT  Take 227 g by mouth as needed (for fluid, nectar thick).     furosemide 10 MG/ML injection  Commonly known as:  LASIX  Inject 4 mLs (40 mg total) into the vein daily.     loratadine 10 MG tablet  Commonly known as:  CLARITIN  Take 10 mg by mouth daily.     ondansetron 4 MG tablet  Commonly known as:  ZOFRAN  Take 1 tablet (4 mg total) by mouth every 6 (six) hours as needed for nausea.     oxyCODONE 5 MG immediate release tablet  Commonly known as:  Oxy IR/ROXICODONE  Take 1 tablet (5 mg total) by mouth every 6 (six) hours as needed for severe pain or breakthrough pain.     polyethylene glycol packet  Commonly known as:  MIRALAX / GLYCOLAX  Take 17 g by mouth as needed.     predniSONE 10 MG tablet  Commonly known as:  DELTASONE  Take 5 mg by mouth every other day.     senna-docusate 8.6-50 MG per tablet  Commonly known as:   Senokot-S  Take 2 tablets by mouth 2 (two) times daily.     Vancomycin 750 MG/150ML Soln  Commonly known as:  VANCOCIN  Inject 150 mLs (750 mg total) into the vein every 12 (twelve) hours.  warfarin 2 MG tablet  Commonly known as:  COUMADIN  Take 1 tablet (2 mg total) by mouth one time only at 6 PM.       Please HPI for pertinent positives, otherwise complete 10 system ROS negative.  Physical Exam: T: 97.2 F, HR: 76bpm, O2: 98% on 3L  General Appearance:  Alert, cooperative, no distress, appears stated age  Head:  Normocephalic, without obvious abnormality, atraumatic  Neck: Supple, symmetrical, trachea midline  Lungs:   Scattered crackles bilaterally, respirations unlabored without use of accessory muscles.  Chest Wall:  No tenderness or deformity  Heart:  Regular rate and rhythm, S1, S2 normal, no murmur, rub or gallop.  Abdomen:   Soft, non-tender, non distended, (+) BS  Neurologic: Normal affect, no gross deficits.   Results for orders placed during the hospital encounter of 02/04/2013 (from the past 48 hour(s))  PROTIME-INR     Status: Abnormal   Collection Time    02/01/13  5:00 AM      Result Value Range   Prothrombin Time 35.3 (*) 11.6 - 15.2 seconds   INR 3.70 (*) 0.00 - 1.49  CBC     Status: Abnormal   Collection Time    02/01/13  5:00 AM      Result Value Range   WBC 18.8 (*) 4.0 - 10.5 K/uL   RBC 4.32  4.22 - 5.81 MIL/uL   Hemoglobin 14.3  13.0 - 17.0 g/dL   HCT 16.1  09.6 - 04.5 %   MCV 97.5  78.0 - 100.0 fL   MCH 33.1  26.0 - 34.0 pg   MCHC 34.0  30.0 - 36.0 g/dL   RDW 40.9  81.1 - 91.4 %   Platelets 284  150 - 400 K/uL  BASIC METABOLIC PANEL     Status: Abnormal   Collection Time    02/01/13  5:00 AM      Result Value Range   Sodium 142  135 - 145 mEq/L   Potassium 4.3  3.5 - 5.1 mEq/L   Chloride 111  96 - 112 mEq/L   CO2 20  19 - 32 mEq/L   Glucose, Bld 90  70 - 99 mg/dL   BUN 32 (*) 6 - 23 mg/dL   Creatinine, Ser 7.82  0.50 - 1.35 mg/dL    Calcium 95.6  8.4 - 10.5 mg/dL   GFR calc non Af Amer 50 (*) >90 mL/min   GFR calc Af Amer 58 (*) >90 mL/min   Comment: (NOTE)     The eGFR has been calculated using the CKD EPI equation.     This calculation has not been validated in all clinical situations.     eGFR's persistently <90 mL/min signify possible Chronic Kidney     Disease.  PROTIME-INR     Status: Abnormal   Collection Time    02/02/13  5:50 AM      Result Value Range   Prothrombin Time 18.4 (*) 11.6 - 15.2 seconds   INR 1.58 (*) 0.00 - 1.49  CBC WITH DIFFERENTIAL     Status: Abnormal   Collection Time    02/02/13  5:50 AM      Result Value Range   WBC 22.1 (*) 4.0 - 10.5 K/uL   RBC 4.30  4.22 - 5.81 MIL/uL   Hemoglobin 14.0  13.0 - 17.0 g/dL   HCT 21.3  08.6 - 57.8 %   MCV 97.7  78.0 - 100.0 fL   MCH 32.6  26.0 - 34.0 pg   MCHC 33.3  30.0 - 36.0 g/dL   RDW 19.1  47.8 - 29.5 %   Platelets 301  150 - 400 K/uL   Neutrophils Relative % 69  43 - 77 %   Neutro Abs 15.3 (*) 1.7 - 7.7 K/uL   Lymphocytes Relative 19  12 - 46 %   Lymphs Abs 4.1 (*) 0.7 - 4.0 K/uL   Monocytes Relative 10  3 - 12 %   Monocytes Absolute 2.2 (*) 0.1 - 1.0 K/uL   Eosinophils Relative 2  0 - 5 %   Eosinophils Absolute 0.5  0.0 - 0.7 K/uL   Basophils Relative 0  0 - 1 %   Basophils Absolute 0.0  0.0 - 0.1 K/uL   Dg Abd Portable 1v  02/02/2013   CLINICAL DATA:  Nasogastric tube placement  EXAM: PORTABLE ABDOMEN - 1 VIEW  COMPARISON:  Study obtained earlier in the day  FINDINGS: Nasogastric tube tip is in the gastric cardia region. The side port is at the gastroesophageal junction.  The bowel gas pattern is unremarkable.  There is extensive arterial calcification in the pelvis. There is a filter in the inferior vena cava with the apex at the level of L1-2 on the right.  IMPRESSION: Nasogastric tube side port at the gastroesophageal junction. Suggest advancing nasogastric tube 8-10 cm.  Bowel gas pattern unremarkable.   Electronically Signed   By:  Bretta Bang M.D.   On: 02/02/2013 08:00   Dg Abd Portable 1v  02/02/2013   CLINICAL DATA:  Evaluate NG tube placement  EXAM: PORTABLE ABDOMEN - 1 VIEW  COMPARISON:  02/01/2013  FINDINGS: Significantly diminished exam detail due to motion artifact. Enteric tube is identified with tip below the GE junction. The tip is coiled within the proximal stomach. IVC filter is noted. There is chronic lung changes identified bilaterally.  IMPRESSION: 1. NG tube coiled within the proximal stomach.   Electronically Signed   By: Signa Kell M.D.   On: 02/02/2013 01:09   Dg Abd Portable 1v  02/01/2013   CLINICAL DATA:  Nasogastric tube placement.  EXAM: PORTABLE ABDOMEN - 1 VIEW  COMPARISON:  CTA of the abdomen, pelvis and lower extremities performed 01/03/2013  FINDINGS: The patient's nasogastric tube is not visualized. It may be coiled within the patient's mouth.  The visualized bowel gas pattern is grossly unremarkable. Contrast is seen partially filling the colon. An IVC filter is noted. Scattered postoperative change is noted about the lower abdomen.  The patient's right PICC was noted ending within the right atrium on the prior study. No acute osseous abnormalities are seen. The visualized lung bases demonstrate bibasilar airspace opacification, better characterized on the prior chest radiograph.  IMPRESSION: Nasogastric tube not visualized on this study; it may be coiled within the patient's mouth.  These results were called by telephone at the time of interpretation on 02/01/2013 at 10:24 PM to Nursing in [facility], who verbally acknowledged these results.   Electronically Signed   By: Roanna Raider M.D.   On: 02/01/2013 22:25    Assessment/Plan Respiratory failure. Dysphagia with NGT in place. Malnutrition Request for image guided percutaneous gastric tube placement.  PAF on coumadin, being held and vitamin K given 12/26 INR today 1.58, continue to hold and check INR until  12/29 Leukocytosis, wbc 22 (18) on IV meropenem q8hrs for possible aspiration versus HCAP, afebrile. On chronic steroids check CBC 12/29 Right foot osteomyelitis on antibiotics.  PVD. AAA s/p repair. Prostate cancer s/p prostatectomy.  History of prior DVT and PE. Mild AS. Will give barium 12/28 via NGT and check KUB 12/29. Hold tube feeds 12/29.  Pattricia Boss D PA-C 02/02/2013, 9:32 AM

## 2013-02-03 LAB — CBC WITH DIFFERENTIAL/PLATELET
Basophils Absolute: 0 10*3/uL (ref 0.0–0.1)
Basophils Relative: 0 % (ref 0–1)
HCT: 39.6 % (ref 39.0–52.0)
Hemoglobin: 13.2 g/dL (ref 13.0–17.0)
Lymphocytes Relative: 15 % (ref 12–46)
MCHC: 33.3 g/dL (ref 30.0–36.0)
MCV: 96.8 fL (ref 78.0–100.0)
Monocytes Relative: 10 % (ref 3–12)
Neutro Abs: 13.3 10*3/uL — ABNORMAL HIGH (ref 1.7–7.7)
Neutrophils Relative %: 72 % (ref 43–77)
RBC: 4.09 MIL/uL — ABNORMAL LOW (ref 4.22–5.81)
RDW: 14.8 % (ref 11.5–15.5)
WBC: 18.4 10*3/uL — ABNORMAL HIGH (ref 4.0–10.5)

## 2013-02-03 LAB — BASIC METABOLIC PANEL
BUN: 37 mg/dL — ABNORMAL HIGH (ref 6–23)
CO2: 22 mEq/L (ref 19–32)
Chloride: 109 mEq/L (ref 96–112)
GFR calc Af Amer: 59 mL/min — ABNORMAL LOW (ref >90)
Potassium: 3.6 mEq/L (ref 3.5–5.1)

## 2013-02-03 LAB — PROCALCITONIN: Procalcitonin: <0.1 ng/mL

## 2013-02-03 LAB — PROTIME-INR
INR: 1.51 — ABNORMAL HIGH (ref 0.00–1.49)
Prothrombin Time: 17.8 seconds — ABNORMAL HIGH (ref 11.6–15.2)

## 2013-02-04 ENCOUNTER — Other Ambulatory Visit (HOSPITAL_COMMUNITY): Payer: Self-pay

## 2013-02-04 LAB — COMPREHENSIVE METABOLIC PANEL
ALT: 28 U/L (ref 0–53)
AST: 56 U/L — ABNORMAL HIGH (ref 0–37)
Alkaline Phosphatase: 121 U/L — ABNORMAL HIGH (ref 39–117)
CO2: 22 mEq/L (ref 19–32)
Chloride: 108 mEq/L (ref 96–112)
GFR calc non Af Amer: 57 mL/min — ABNORMAL LOW (ref >90)
Potassium: 4.1 mEq/L (ref 3.5–5.1)
Sodium: 141 mEq/L (ref 135–145)
Total Bilirubin: 0.4 mg/dL (ref 0.3–1.2)

## 2013-02-04 LAB — CBC WITH DIFFERENTIAL/PLATELET
Basophils Absolute: 0 10*3/uL (ref 0.0–0.1)
Lymphocytes Relative: 22 % (ref 12–46)
Neutro Abs: 8.6 10*3/uL — ABNORMAL HIGH (ref 1.7–7.7)
Platelets: 144 10*3/uL — ABNORMAL LOW (ref 150–400)
RBC: 3.96 MIL/uL — ABNORMAL LOW (ref 4.22–5.81)
RDW: 15.1 % (ref 11.5–15.5)
WBC: 13.6 10*3/uL — ABNORMAL HIGH (ref 4.0–10.5)

## 2013-02-04 LAB — PROTIME-INR: INR: 1.89 — ABNORMAL HIGH (ref 0.00–1.49)

## 2013-02-04 LAB — PREALBUMIN: Prealbumin: 9.3 mg/dL — ABNORMAL LOW (ref 17.0–34.0)

## 2013-02-04 NOTE — Progress Notes (Signed)
Patient ID: Glenn Barron, male   DOB: 12/27/1922, 79 y.o.   MRN: [id number]   Was scheduled for percutaneous gastric tube placement today  Did not get barium til last night---so delayed Then INR 1.89 today!  Now we will reschedule to 12/30 Recheck kub and INR in am. Last dose coumadin 12/26 per RN  RN and MD aware orders in chart

## 2013-02-05 ENCOUNTER — Other Ambulatory Visit (HOSPITAL_COMMUNITY): Payer: Self-pay

## 2013-02-05 DIAGNOSIS — J9600 Acute respiratory failure, unspecified whether with hypoxia or hypercapnia: Secondary | ICD-10-CM

## 2013-02-05 LAB — PROTIME-INR
INR: 1.64 — ABNORMAL HIGH (ref 0.00–1.49)
Prothrombin Time: 19.6 seconds — ABNORMAL HIGH (ref 11.6–15.2)
Prothrombin Time: 19 seconds — ABNORMAL HIGH (ref 11.6–15.2)

## 2013-02-05 NOTE — Consult Note (Signed)
Name: Glenn Barron MRN: [id number] DOB: 02-07-1923    ADMISSION DATE:  2013-01-31 CONSULTATION DATE: 02/05/13  REFERRING MD :  Dr. Sharyon Medicus PRIMARY SERVICE:  [facility] [place]  CHIEF COMPLAINT:  Recurrent Aspiration / Failure to Thrive  BRIEF PATIENT DESCRIPTION: 79 y/o M with complex medical history admitted to [facility] on 12/3 for Rehab post osteomyelitis of R foot.   hx of PAF, prior PE and DVT on chronic anticoagulation, hx of PVD, mild AS, HTN, chronic lower extremity swelling and recent admit to [facility] from 11/25 to 12/03 for HCAP,  osteomyelitis of R foot and concerns for PVD in extremities (occluded popliteal artery).  He is followed by Dr. Kriste Basque for primary care.    SIGNIFICANT EVENTS / STUDIES:  11/25 - 12/03:  Admit to [facility] for HCAP, Oseto of R foot  LINES / TUBES: NGT>>>  CULTURES: Sputum 12/21>>>moderate candida UC 12/21>>>55k yeast BCx2 12/27>>>ngtd  ANTIBIOTICS: Fluconazole 12/23>>> Bactrim 12/11>>>  HISTORY OF PRESENT ILLNESS:  Glenn Barron is an 79 y.o. male lives at home with round the clock help, hx of PAF, prior PE and DVT on chronic anticoagulation, hx of PVD, mild AS, HTN, chronic lower extremity swelling and recent admit to [facility] from 11/25 to 12/03 for HCAP,  osteomyelitis of R foot and concerns for PVD in extremities (occluded popliteal artery).  He is followed by Dr. Kriste Basque for primary care.  He was evaluated by Vascular Surgery at that time and recommendations were made for conservative management.  He had a prolonged hospitalization and was transferred to [facility] on 12/3 for rehab efforts in the setting of severe deconditioning.    Since admit at [facility], he has had ongoing issues with concern for aspiration in the setting of dysphagia with failure to thrive. Sputum cultures positive for moderate candida and pt was covered with fluconazole.  He has continued on bactrim for foot osteo.  Patient has made requests for comfort focused care.  PCCM consulted at daughters request to  determine if any further actions can be taken to improve dysphagia / aspiration.    PAST MEDICAL HISTORY :  Past Medical History  Diagnosis Date  . Allergic rhinitis, cause unspecified   . Unspecified chronic bronchitis   . Other pulmonary embolism and infarction   . Paroxysmal a-fib   . Cerebrovascular disease, unspecified 10/2002    right side weakness  . Peripheral vascular disease, unspecified   . Unspecified venous (peripheral) insufficiency   . Acute venous embolism and thrombosis of unspecified deep vessels of lower extremity   . Hyperlipidemia   . GERD (gastroesophageal reflux disease)   . Diverticulosis of colon (without mention of hemorrhage)   . Irritable bowel syndrome   . Benign neoplasm of colon   . Unspecified hereditary and idiopathic peripheral neuropathy   . Depression   . Right bundle branch block   . History of scarlet fever 1944  . Aortic stenosis, mild     mild by echo 04/2011  . Heart murmur   . Hypertension     Dr. Eden Emms cardiologist ; "haven't taken anything for a couple years since losing weight" (01/01/2013)  . DVT (deep venous thrombosis)     "put filter in to prevent" (01/01/2013)  . Asthma     "mild" (01/01/2013)  . Pneumonia     "a couple times" (01/01/2013)  . Exertional shortness of breath     "as I get older" (01/01/2013)  . Unspecified cerebral artery occlusion with cerebral infarction   . Stroke  09/2002    "right foot still drags" (01/01/2013)  . Osteoarthrosis, unspecified whether generalized or localized, unspecified site   . Arthritis     "both ankles" (01/01/2013)  . DDD (degenerative disc disease)   . Malignant neoplasm of prostate   . Skin cancer     "off my face" (01/01/2013)   Past Surgical History  Procedure Laterality Date  . Prostatectomy    . Cataract extraction w/ intraocular lens  implant, bilateral Bilateral   . Carotid endarterectomy Right   . Abdominal aortic aneurysm repair    . Skin cancer excision      "left  face and arms" (01/01/2013)  . Tonsillectomy and adenoidectomy    . Inguinal hernia repair Left 1991  . Inguinal hernia repair Right 1930's  . Soft tissue mass excision  1957    "off left arm and back" (01/01/2013)  . Vena cava filter placement Right    Prior to Admission medications   Medication Sig Start Date End Date Taking? Authorizing Provider  acetaminophen (TYLENOL) 650 MG CR tablet Take 650 mg by mouth 2 (two) times daily.     Historical Provider, MD  albuterol (PROVENTIL HFA;VENTOLIN HFA) 108 (90 BASE) MCG/ACT inhaler Inhale 1-2 puffs into the lungs every 6 (six) hours as needed for wheezing or shortness of breath.    Historical Provider, MD  aspirin 81 MG tablet Take 81 mg by mouth daily.      Historical Provider, MD  atorvastatin (LIPITOR) 20 MG tablet take 1 tablet by mouth once daily 10/24/12   Michele Mcalpine, MD  bisacodyl (DULCOLAX) 10 MG suppository Place 1 suppository (10 mg total) rectally daily as needed for moderate constipation. January 22, 2013   Maryann Mikhail, DO  calcium carbonate (TUMS - DOSED IN MG ELEMENTAL CALCIUM) 500 MG chewable tablet Chew 1-2 tablets by mouth daily as needed for indigestion or heartburn. As needed    Historical Provider, MD  cholecalciferol (VITAMIN D) 1000 UNITS tablet Take 1,000 Units by mouth daily.      Historical Provider, MD  dextrose 5 % SOLN 50 mL with ceFEPIme 1 G SOLR 1 g Inject 1 g into the vein every 12 (twelve) hours. 22-Jan-2013   Maryann Mikhail, DO  famotidine (PEPCID) 10 MG tablet Take 10 mg by mouth at bedtime.      Historical Provider, MD  fluticasone Aleda Grana) 50 MCG/ACT nasal spray instill 2 sprays into each nostril once daily 09/19/12   Michele Mcalpine, MD  Fluticasone-Salmeterol (ADVAIR) 250-50 MCG/DOSE AEPB Inhale 1 puff into the lungs 2 (two) times daily. Rinse mouth well after use    Historical Provider, MD  food thickener (THICK IT) POWD Take 227 g by mouth as needed (for fluid, nectar thick). Jan 22, 2013   Maryann Mikhail, DO  furosemide  (LASIX) 10 MG/ML injection Inject 4 mLs (40 mg total) into the vein daily. Jan 22, 2013   Maryann Mikhail, DO  loratadine (CLARITIN) 10 MG tablet Take 10 mg by mouth daily.    Historical Provider, MD  ondansetron (ZOFRAN) 4 MG tablet Take 1 tablet (4 mg total) by mouth every 6 (six) hours as needed for nausea. Jan 22, 2013   Maryann Mikhail, DO  oxyCODONE (OXY IR/ROXICODONE) 5 MG immediate release tablet Take 1 tablet (5 mg total) by mouth every 6 (six) hours as needed for severe pain or breakthrough pain. 01-22-2013   Maryann Mikhail, DO  polyethylene glycol (MIRALAX / GLYCOLAX) packet Take 17 g by mouth as needed.    Historical Provider, MD  predniSONE (  DELTASONE) 10 MG tablet Take 5 mg by mouth every other day.  08/02/12   Michele Mcalpine, MD  Probiotic Product (ALIGN PO) Take 1 capsule by mouth daily.    Historical Provider, MD  senna-docusate (SENOKOT-S) 8.6-50 MG per tablet Take 2 tablets by mouth 2 (two) times daily. January 22, 2013   Maryann Mikhail, DO  Vancomycin (VANCOCIN) 750 MG/150ML SOLN Inject 150 mLs (750 mg total) into the vein every 12 (twelve) hours. 01/22/13   Maryann Mikhail, DO  warfarin (COUMADIN) 2 MG tablet Take 1 tablet (2 mg total) by mouth one time only at 6 PM. 2013/01/22   Nita Sells Mikhail, DO   Allergies  Allergen Reactions  . Doxycycline     Rash  . Amoxicillin Rash    Yeast infection     FAMILY HISTORY:  Family History  Problem Relation Age of Onset  . Anesthesia problems Neg Hx   . Heart attack Father   . Hyperlipidemia Mother   . Hypertension Mother   . Hypertension Sister    SOCIAL HISTORY:  reports that he has never smoked. He has never used smokeless tobacco. He reports that he drinks alcohol. He reports that he does not use illicit drugs.  REVIEW OF SYSTEMS:  Unable to complete as pt is altered.  No family available.    SUBJECTIVE:   VITAL SIGNS: Reviewed at bedside.   PHYSICAL EXAMINATION: General:  Frail, elderly male, cachectic  Neuro:  Arouses to name then drifts  back to sleep, denies pain HEENT:  Mm dry, thick dried secretions removed from pt's mouth, no jvd Cardiovascular:  s1s2 irr irr Lungs:  resp's mildly labored, lungs bilaterally coarse with rhonchi Abdomen:  Round/soft, bsx4 hypoactive Musculoskeletal:  No acute deformities Skin:  Thin, dry, multiple scattered bruises   Recent Labs Lab 02/01/13 0500 02/03/13 0545 02/04/13 0714  NA 142 141 141  K 4.3 3.6 4.1  CL 111 109 108  CO2 20 22 22   BUN 32* 37* 40*  CREATININE 1.24 1.23 1.11  GLUCOSE 90 122* 88    Recent Labs Lab 02/02/13 0550 02/03/13 0545 02/04/13 0714  HGB 14.0 13.2 12.8*  HCT 42.0 39.6 38.4*  WBC 22.1* 18.4* 13.6*  PLT 301 184 144*   Dg Chest Port 1 View  02/05/2013   CLINICAL DATA:  Followup pneumonia.  EXAM: PORTABLE CHEST - 1 VIEW  COMPARISON:  02/04/2013  FINDINGS: Nasogastric tube enters the abdomen. Right subclavian central line has its tip in the right atrium. Patchy bronchopneumonia in both lower lobes persists, possibly slightly improved on the left. No worsening or new finding.  IMPRESSION: Persistent bilateral lower lobe bronchopneumonia, possibly with some improvement on the left.   Electronically Signed   By: Paulina Fusi M.D.   On: 02/05/2013 11:18   Dg Chest Port 1 View  02/04/2013   CLINICAL DATA:  Pneumonia.  EXAM: PORTABLE CHEST - 1 VIEW  COMPARISON:  Chest x-ray 01/26/2013.  FINDINGS: There is a right-sided subclavian central venous catheter with tip terminating in the right atrium. A nasogastric tube is seen extending into the stomach, however, the tip of the nasogastric tube extends below the lower margin of the image. Lung volumes are low. Patchy multifocal interstitial and airspace opacities throughout the lungs bilaterally, most pronounced in the mid to lower lungs bilaterally, particularly at the left base, concerning for multilobar pneumonia. Probable small bilateral pleural effusions. No definite signs of pulmonary edema. Heart size is upper  limits of normal. The patient is rotated to  the left on today's exam, resulting in distortion of the mediastinal contours and reduced diagnostic sensitivity and specificity for mediastinal pathology. Atherosclerosis in the thoracic aorta.  IMPRESSION: 1. Support apparatus, as above. 2. Otherwise, allowing for slight differences in patient positioning, the radiographic appearance the chest is very similar to the prior study, as detailed above.   Electronically Signed   By: Trudie Reed M.D.   On: 02/04/2013 07:45   Dg Abd Portable 1v  02/05/2013   CLINICAL DATA:  Barium administered for interventional radiology study.  EXAM: PORTABLE ABDOMEN - 1 VIEW  COMPARISON:  01/2026  FINDINGS: Nasogastric tube enters the stomach and has its tip in the antrum. Administered barium is present throughout the right: , transverse colon and descending colon to the mid portion at the descending sigmoid junction.  IMPRESSION: Previously administered barium is present throughout the colon as far as the descending sigmoid junction.   Electronically Signed   By: Paulina Fusi M.D.   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  He is a DNR at baseline.  Cognitively, he has been intact up to the last few days per medical staff.  He has made requests of "please give me dilaudid and let me be comfortable". Unfortunately, he has had repeated episodes of aspiration PNA and has been NPO which further contributes to deconditioning.  He has multiple cormorbidities at baseline and has had recent hospitalization contributing to ongoing failure to thrive.  He appears to have worsened clinically with concerns for worsening PNA on CXR.     Plan: -recommend goals of care  meeting with family to discuss case -continue abx, medical management -would recommend comfort focused management after discussion with family -no role for bronchoscopy  -would not recommend bipap given patients expressed desires and it would not add benefit to patients overall state of health.  Actually, would be greater risk for aspiration.   -consider cessation of coumadin given advanced age, failure to thrive and high fall risk  Canary Brim, NP-C [facility] Pgr: [phone] or [phone]  Independently examined pt, evaluated data & formulated above care plan with NP who scribed this note & edited by me.  Pragya Lofaso V.    02/05/2013, 12:43 PM

## 2013-02-06 LAB — BASIC METABOLIC PANEL
Calcium: 9.9 mg/dL (ref 8.4–10.5)
Chloride: 107 mEq/L (ref 96–112)
Creatinine, Ser: 1.02 mg/dL (ref 0.50–1.35)
GFR calc Af Amer: 74 mL/min — ABNORMAL LOW (ref >90)
GFR calc non Af Amer: 63 mL/min — ABNORMAL LOW (ref >90)
Glucose, Bld: 94 mg/dL (ref 70–99)
Potassium: 4.8 mEq/L (ref 3.7–5.3)
Sodium: 140 mEq/L (ref 137–147)

## 2013-02-06 LAB — CBC WITH DIFFERENTIAL/PLATELET
Basophils Absolute: 0 10*3/uL (ref 0.0–0.1)
Basophils Relative: 0 % (ref 0–1)
Eosinophils Relative: 6 % — ABNORMAL HIGH (ref 0–5)
HCT: 39 % (ref 39.0–52.0)
Lymphocytes Relative: 24 % (ref 12–46)
MCHC: 33.1 g/dL (ref 30.0–36.0)
MCV: 97.5 fL (ref 78.0–100.0)
Monocytes Absolute: 1.6 10*3/uL — ABNORMAL HIGH (ref 0.1–1.0)
Neutro Abs: 11.5 10*3/uL — ABNORMAL HIGH (ref 1.7–7.7)
Platelets: 152 10*3/uL (ref 150–400)
RDW: 15.2 % (ref 11.5–15.5)
WBC: 18.5 10*3/uL — ABNORMAL HIGH (ref 4.0–10.5)

## 2013-02-06 LAB — PROCALCITONIN: Procalcitonin: <0.1 ng/mL

## 2013-02-06 LAB — PROTIME-INR
INR: 1.74 — ABNORMAL HIGH (ref 0.00–1.49)
Prothrombin Time: 19.8 seconds — ABNORMAL HIGH (ref 11.6–15.2)

## 2013-02-07 ENCOUNTER — Other Ambulatory Visit (HOSPITAL_COMMUNITY): Payer: Self-pay

## 2013-02-07 LAB — CBC WITH DIFFERENTIAL/PLATELET
Basophils Absolute: 0 10*3/uL (ref 0.0–0.1)
Basophils Relative: 0 % (ref 0–1)
Eosinophils Absolute: 0.6 10*3/uL (ref 0.0–0.7)
Eosinophils Relative: 3 % (ref 0–5)
HCT: 38.2 % — ABNORMAL LOW (ref 39.0–52.0)
Hemoglobin: 13 g/dL (ref 13.0–17.0)
Lymphocytes Relative: 24 % (ref 12–46)
Lymphs Abs: 4.3 10*3/uL — ABNORMAL HIGH (ref 0.7–4.0)
MCH: 32.5 pg (ref 26.0–34.0)
MCHC: 34 g/dL (ref 30.0–36.0)
MCV: 95.5 fL (ref 78.0–100.0)
Monocytes Absolute: 1.6 10*3/uL — ABNORMAL HIGH (ref 0.1–1.0)
Monocytes Relative: 9 % (ref 3–12)
Neutro Abs: 11.1 10*3/uL — ABNORMAL HIGH (ref 1.7–7.7)
Neutrophils Relative %: 63 % (ref 43–77)
Platelets: 162 10*3/uL (ref 150–400)
RBC: 4 MIL/uL — ABNORMAL LOW (ref 4.22–5.81)
RDW: 15.4 % (ref 11.5–15.5)
WBC: 17.6 10*3/uL — ABNORMAL HIGH (ref 4.0–10.5)

## 2013-02-07 LAB — BASIC METABOLIC PANEL
BUN: 38 mg/dL — ABNORMAL HIGH (ref 6–23)
CO2: 22 mEq/L (ref 19–32)
Calcium: 9.6 mg/dL (ref 8.4–10.5)
Chloride: 102 mEq/L (ref 96–112)
Creatinine, Ser: 1.09 mg/dL (ref 0.50–1.35)
GFR calc Af Amer: 68 mL/min — ABNORMAL LOW (ref >90)
GFR calc non Af Amer: 59 mL/min — ABNORMAL LOW (ref >90)
Glucose, Bld: 105 mg/dL — ABNORMAL HIGH (ref 70–99)
Potassium: 4.9 mEq/L (ref 3.7–5.3)
Sodium: 135 mEq/L — ABNORMAL LOW (ref 137–147)

## 2013-02-07 DEATH — deceased

## 2013-02-08 ENCOUNTER — Telehealth: Payer: Self-pay | Admitting: Pulmonary Disease

## 2013-02-08 DIAGNOSIS — J690 Pneumonitis due to inhalation of food and vomit: Secondary | ICD-10-CM

## 2013-02-08 DIAGNOSIS — J9600 Acute respiratory failure, unspecified whether with hypoxia or hypercapnia: Secondary | ICD-10-CM

## 2013-02-08 LAB — CULTURE, BLOOD (ROUTINE X 2)
Culture: NO GROWTH
Culture: NO GROWTH

## 2013-02-08 LAB — BASIC METABOLIC PANEL
BUN: 41 mg/dL — AB (ref 6–23)
CALCIUM: 9.6 mg/dL (ref 8.4–10.5)
CO2: 21 mEq/L (ref 19–32)
Chloride: 100 mEq/L (ref 96–112)
Creatinine, Ser: 1.03 mg/dL (ref 0.50–1.35)
GFR calc Af Amer: 73 mL/min — ABNORMAL LOW (ref >90)
GFR, EST NON AFRICAN AMERICAN: 63 mL/min — AB (ref >90)
Glucose, Bld: 109 mg/dL — ABNORMAL HIGH (ref 70–99)
POTASSIUM: 5.1 meq/L (ref 3.7–5.3)
Sodium: 135 mEq/L — ABNORMAL LOW (ref 137–147)

## 2013-02-08 LAB — CBC
HEMATOCRIT: 39 % (ref 39.0–52.0)
Hemoglobin: 13 g/dL (ref 13.0–17.0)
MCH: 31.9 pg (ref 26.0–34.0)
MCHC: 33.3 g/dL (ref 30.0–36.0)
MCV: 95.8 fL (ref 78.0–100.0)
Platelets: 163 10*3/uL (ref 150–400)
RBC: 4.07 MIL/uL — ABNORMAL LOW (ref 4.22–5.81)
RDW: 15.5 % (ref 11.5–15.5)
WBC: 16.1 10*3/uL — ABNORMAL HIGH (ref 4.0–10.5)

## 2013-02-08 NOTE — Telephone Encounter (Signed)
Per SN: Pt was seen in [facility] by a partner of SN (Dr Elsworth Soho) who is an excellent clinician and pulmonary/critical care Dr.  Bethann Goo and Dr Elsworth Soho discussed pt care and there are really no good options. He was also seen by his Vascular Dr as well. Dr Lenna Gilford feels that pt is getting the best care and everything that can be done is being done.  With the pts multiple bouts with PNA, this has weakened him and is more than likely causing it to be difficult to fight against the infection SN recs are to continue with current level of care and comfort measures.  LMOM x 1

## 2013-02-08 NOTE — Telephone Encounter (Signed)
Spoke with Daughter-- Daughter is very grateful for the communication she had with Dr Elsworth Soho and Dr Lenna Gilford today. She feels better in spirits knowing that he is getting excellent care and that everything possible is being done.  Daughter wanted to extend her deepest thanks to Dr Lenna Gilford for all his information and help in this matter.  Will send to Dr Lenna Gilford of [facility].

## 2013-02-08 NOTE — Telephone Encounter (Signed)
Spoke with Wynelle Bourgeois-- States that father is @ [facility] Pt was admitted to hospital for toe infection Pt aspirated food and developed PNA--progressed to double PNA Pt has completed rounds of abx and started to improve. PNA returned and pt is back in hospital Daughter states that pt has declined very quickly. NP's have spoken to daughter in regards to making critical decisions, re: considering Hospice and removing pt from all meds. Daughter is wanting Dr Lenna Gilford to look over pt chart. Daughter is wanting to be reassured that [facility] is doing everything they can and that they are not missing anything. She wants to make sure that he is getting the best care. Daughter states that she is beginning to worry  Original admit date 01/01/13--transferred to SSH/Step down unit--transferred back to [facility] Jan 24, 2013.  Please advise anything to tell pt Dr Lenna Gilford. Thanks.

## 2013-02-08 NOTE — Progress Notes (Signed)
Name: Glenn Barron MRN: [id number] DOB: 03-12-1922    ADMISSION DATE:  January 15, 2013 CONSULTATION DATE: 02/05/13  REFERRING MD :  Dr. Laren Barron PRIMARY SERVICE:  [facility]  CHIEF COMPLAINT:  Recurrent Aspiration / Failure to Thrive  BRIEF PATIENT DESCRIPTION: 80 y/o M with complex medical history admitted to [facility] on 12/3 for Rehab post osteomyelitis of R foot.   hx of PAF, prior PE and DVT on chronic anticoagulation, hx of PVD, mild AS, HTN, chronic lower extremity swelling and recent admit to [facility] from 11/25 to 12/03 for HCAP,  osteomyelitis of R foot and concerns for PVD in extremities (occluded popliteal artery).  He is followed by Dr. Lenna Barron for primary care.   PCCM consulted at daughters request to determine if any further actions can be taken to improve dysphagia / aspiration.     SIGNIFICANT EVENTS / STUDIES:  11/25 - 12/03:  Admit to [facility] for HCAP, Oseto of R foot  LINES / TUBES: NGT>>>  CULTURES: Sputum 12/21>>>moderate candida UC 12/21>>>55k yeast BCx2 12/27>>>ngtd  ANTIBIOTICS: Fluconazole 12/23>>> Bactrim 12/11>>>  SUBJECTIVE:   VITAL SIGNS: Reviewed at bedside.   PHYSICAL EXAMINATION: General:  Frail, elderly male, cachectic  Neuro:  Arouses to name then drifts back to sleep, denies pain HEENT:  Mm dry, thick dried secretions removed from pt's mouth, no jvd Cardiovascular:  s1s2 irr irr Lungs:  resp's mildly labored, lungs bilaterally coarse with rhonchi Abdomen:  Round/soft, bsx4 hypoactive Musculoskeletal:  No acute deformities Skin:  Thin, dry, multiple scattered bruises   Recent Labs Lab 02/06/13 0610 02/07/13 0730 02/08/13 0550  NA 140 135* 135*  K 4.8 4.9 5.1  CL 107 102 100  CO2 24 22 21   BUN 34* 38* 41*  CREATININE 1.02 1.09 1.03  GLUCOSE 94 105* 109*    Recent Labs Lab 02/06/13 0610 02/07/13 0730 02/08/13 0550  HGB 12.9* 13.0 13.0  HCT 39.0 38.2* 39.0  WBC 18.5* 17.6* 16.1*  PLT 152 162 163   Dg Chest Port 1 View  02/07/2013   CLINICAL  DATA:  Worsening pneumonia/respiratory failure.  EXAM: PORTABLE CHEST - 1 VIEW  COMPARISON:  02/05/2013  FINDINGS: Right-sided PICC line and nasogastric tube unchanged. Lungs are hypoinflated as patient is slightly rotated to the right. There is continued bibasilar opacification without significant change which may be due to atelectasis versus infection. Suggestion of a small amount right pleural fluid. Remainder of the exam is unchanged.  IMPRESSION: Persistent mild bibasilar opacification which may be due to atelectasis or infection. Possible small amount of right pleural fluid.  Tubes and lines unchanged.   Electronically Signed   By: Glenn Barron M.D.   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  He is a DNR at baseline.  Cognitively, he has been intact up to the last few days per medical staff.  He has made requests of "please give me dilaudid and let me be comfortable". Unfortunately, he has had repeated episodes of aspiration PNA and has been NPO which further contributes to deconditioning.  He has multiple cormorbidities at baseline and has had recent hospitalization contributing to ongoing failure to thrive.  He appears to have worsened clinically with concerns for worsening PNA on CXR.  Plan: I discussed extensively with daughter on 1/2 about his course. -she would like to proceed with PEG based on what she thinks are her father's wishes -she is agreeable to DNR/I otherwise, she understands poor prognosis -recommend continuing goals of care / palliative care discussions -continue abx, medical management -would not recommend bipap given patients expressed desires and it would not add benefit to patients overall state of  health.  Actually, would be greater risk for aspiration.   -consider cessation of coumadin given advanced age, failure to thrive and high fall risk  PCCm available as needed  Glenn Barron,Glenn Barron.  [phone]  02/08/2013, 5:25 PM

## 2013-02-09 LAB — BASIC METABOLIC PANEL
BUN: 41 mg/dL — AB (ref 6–23)
CO2: 25 meq/L (ref 19–32)
Calcium: 10 mg/dL (ref 8.4–10.5)
Chloride: 100 mEq/L (ref 96–112)
Creatinine, Ser: 1.02 mg/dL (ref 0.50–1.35)
GFR calc Af Amer: 74 mL/min — ABNORMAL LOW (ref >90)
GFR, EST NON AFRICAN AMERICAN: 63 mL/min — AB (ref >90)
GLUCOSE: 111 mg/dL — AB (ref 70–99)
Potassium: 4.7 mEq/L (ref 3.7–5.3)
Sodium: 134 mEq/L — ABNORMAL LOW (ref 137–147)

## 2013-02-09 LAB — CBC
HCT: 37.7 % — ABNORMAL LOW (ref 39.0–52.0)
Hemoglobin: 13.1 g/dL (ref 13.0–17.0)
MCH: 32.7 pg (ref 26.0–34.0)
MCHC: 34.7 g/dL (ref 30.0–36.0)
MCV: 94 fL (ref 78.0–100.0)
Platelets: 190 10*3/uL (ref 150–400)
RBC: 4.01 MIL/uL — ABNORMAL LOW (ref 4.22–5.81)
RDW: 15.4 % (ref 11.5–15.5)
WBC: 16.6 10*3/uL — AB (ref 4.0–10.5)

## 2013-02-09 LAB — SEDIMENTATION RATE: SED RATE: 5 mm/h (ref 0–16)

## 2013-02-09 LAB — C-REACTIVE PROTEIN: CRP: 3.2 mg/dL — ABNORMAL HIGH (ref <0.60)

## 2013-02-09 LAB — PROTIME-INR
INR: 1.22 (ref 0.00–1.49)
Prothrombin Time: 15.1 seconds (ref 11.6–15.2)

## 2013-02-11 LAB — CBC WITH DIFFERENTIAL/PLATELET
Basophils Absolute: 0 10*3/uL (ref 0.0–0.1)
Basophils Relative: 0 % (ref 0–1)
EOS ABS: 0.4 10*3/uL (ref 0.0–0.7)
Eosinophils Relative: 3 % (ref 0–5)
HCT: 36 % — ABNORMAL LOW (ref 39.0–52.0)
HEMOGLOBIN: 12.1 g/dL — AB (ref 13.0–17.0)
LYMPHS ABS: 4.2 10*3/uL — AB (ref 0.7–4.0)
LYMPHS PCT: 29 % (ref 12–46)
MCH: 32 pg (ref 26.0–34.0)
MCHC: 33.6 g/dL (ref 30.0–36.0)
MCV: 95.2 fL (ref 78.0–100.0)
MONOS PCT: 13 % — AB (ref 3–12)
Monocytes Absolute: 1.8 10*3/uL — ABNORMAL HIGH (ref 0.1–1.0)
Neutro Abs: 7.8 10*3/uL — ABNORMAL HIGH (ref 1.7–7.7)
Neutrophils Relative %: 55 % (ref 43–77)
PLATELETS: 194 10*3/uL (ref 150–400)
RBC: 3.78 MIL/uL — AB (ref 4.22–5.81)
RDW: 15.4 % (ref 11.5–15.5)
WBC: 14.1 10*3/uL — AB (ref 4.0–10.5)

## 2013-02-11 LAB — BASIC METABOLIC PANEL
BUN: 36 mg/dL — AB (ref 6–23)
CO2: 25 meq/L (ref 19–32)
Calcium: 9.8 mg/dL (ref 8.4–10.5)
Chloride: 97 mEq/L (ref 96–112)
Creatinine, Ser: 0.87 mg/dL (ref 0.50–1.35)
GFR calc Af Amer: 87 mL/min — ABNORMAL LOW (ref >90)
GFR calc non Af Amer: 75 mL/min — ABNORMAL LOW (ref >90)
GLUCOSE: 102 mg/dL — AB (ref 70–99)
POTASSIUM: 4.9 meq/L (ref 3.7–5.3)
SODIUM: 131 meq/L — AB (ref 137–147)

## 2013-02-11 LAB — SEDIMENTATION RATE: Sed Rate: 48 mm/hr — ABNORMAL HIGH (ref 0–16)

## 2013-02-11 LAB — PROTIME-INR
INR: 1.09 (ref 0.00–1.49)
Prothrombin Time: 13.9 seconds (ref 11.6–15.2)

## 2013-02-12 LAB — C-REACTIVE PROTEIN: CRP: 1.6 mg/dL — ABNORMAL HIGH (ref <0.60)

## 2013-02-13 LAB — CBC
HCT: 34.8 % — ABNORMAL LOW (ref 39.0–52.0)
Hemoglobin: 12.3 g/dL — ABNORMAL LOW (ref 13.0–17.0)
MCH: 33.3 pg (ref 26.0–34.0)
MCHC: 35.3 g/dL (ref 30.0–36.0)
MCV: 94.3 fL (ref 78.0–100.0)
PLATELETS: 227 10*3/uL (ref 150–400)
RBC: 3.69 MIL/uL — AB (ref 4.22–5.81)
RDW: 15.7 % — ABNORMAL HIGH (ref 11.5–15.5)
WBC: 11.7 10*3/uL — AB (ref 4.0–10.5)

## 2013-02-13 LAB — BASIC METABOLIC PANEL
BUN: 34 mg/dL — ABNORMAL HIGH (ref 6–23)
CALCIUM: 9.7 mg/dL (ref 8.4–10.5)
CO2: 23 meq/L (ref 19–32)
Chloride: 95 mEq/L — ABNORMAL LOW (ref 96–112)
Creatinine, Ser: 0.83 mg/dL (ref 0.50–1.35)
GFR calc Af Amer: 88 mL/min — ABNORMAL LOW (ref >90)
GFR calc non Af Amer: 76 mL/min — ABNORMAL LOW (ref >90)
Glucose, Bld: 100 mg/dL — ABNORMAL HIGH (ref 70–99)
Potassium: 4.5 mEq/L (ref 3.7–5.3)
Sodium: 131 mEq/L — ABNORMAL LOW (ref 137–147)

## 2013-02-13 LAB — PROTIME-INR
INR: 1.02 (ref 0.00–1.49)
PROTHROMBIN TIME: 13.2 s (ref 11.6–15.2)

## 2013-02-14 LAB — CBC WITH DIFFERENTIAL/PLATELET
BASOS ABS: 0 10*3/uL (ref 0.0–0.1)
BASOS PCT: 0 % (ref 0–1)
Eosinophils Absolute: 0.4 10*3/uL (ref 0.0–0.7)
Eosinophils Relative: 2 % (ref 0–5)
HCT: 37 % — ABNORMAL LOW (ref 39.0–52.0)
Hemoglobin: 12.6 g/dL — ABNORMAL LOW (ref 13.0–17.0)
Lymphocytes Relative: 26 % (ref 12–46)
Lymphs Abs: 3.9 10*3/uL (ref 0.7–4.0)
MCH: 32.6 pg (ref 26.0–34.0)
MCHC: 34.1 g/dL (ref 30.0–36.0)
MCV: 95.9 fL (ref 78.0–100.0)
MONOS PCT: 12 % (ref 3–12)
Monocytes Absolute: 1.8 10*3/uL — ABNORMAL HIGH (ref 0.1–1.0)
NEUTROS ABS: 8.8 10*3/uL — AB (ref 1.7–7.7)
NEUTROS PCT: 59 % (ref 43–77)
Platelets: 276 10*3/uL (ref 150–400)
RBC: 3.86 MIL/uL — ABNORMAL LOW (ref 4.22–5.81)
RDW: 15.9 % — ABNORMAL HIGH (ref 11.5–15.5)
WBC: 14.8 10*3/uL — ABNORMAL HIGH (ref 4.0–10.5)

## 2013-02-14 LAB — BASIC METABOLIC PANEL
BUN: 34 mg/dL — AB (ref 6–23)
CHLORIDE: 97 meq/L (ref 96–112)
CO2: 24 meq/L (ref 19–32)
Calcium: 9.9 mg/dL (ref 8.4–10.5)
Creatinine, Ser: 0.82 mg/dL (ref 0.50–1.35)
GFR calc Af Amer: 89 mL/min — ABNORMAL LOW (ref >90)
GFR calc non Af Amer: 77 mL/min — ABNORMAL LOW (ref >90)
Glucose, Bld: 104 mg/dL — ABNORMAL HIGH (ref 70–99)
POTASSIUM: 4.7 meq/L (ref 3.7–5.3)
Sodium: 133 mEq/L — ABNORMAL LOW (ref 137–147)

## 2013-02-14 LAB — MAGNESIUM: MAGNESIUM: 2.2 mg/dL (ref 1.5–2.5)

## 2013-02-14 LAB — PHOSPHORUS: Phosphorus: 2.8 mg/dL (ref 2.3–4.6)

## 2013-02-16 LAB — PROTIME-INR
INR: 1.11 (ref 0.00–1.49)
Prothrombin Time: 14.1 seconds (ref 11.6–15.2)

## 2013-02-18 ENCOUNTER — Encounter: Payer: Self-pay | Admitting: Radiology

## 2013-02-18 LAB — CBC WITH DIFFERENTIAL/PLATELET
BASOS ABS: 0 10*3/uL (ref 0.0–0.1)
BASOS PCT: 0 % (ref 0–1)
Eosinophils Absolute: 0.3 10*3/uL (ref 0.0–0.7)
Eosinophils Relative: 3 % (ref 0–5)
HEMATOCRIT: 31.2 % — AB (ref 39.0–52.0)
Hemoglobin: 10.8 g/dL — ABNORMAL LOW (ref 13.0–17.0)
LYMPHS PCT: 29 % (ref 12–46)
Lymphs Abs: 2.8 10*3/uL (ref 0.7–4.0)
MCH: 32.8 pg (ref 26.0–34.0)
MCHC: 34.6 g/dL (ref 30.0–36.0)
MCV: 94.8 fL (ref 78.0–100.0)
MONO ABS: 1.2 10*3/uL — AB (ref 0.1–1.0)
Monocytes Relative: 13 % — ABNORMAL HIGH (ref 3–12)
Neutro Abs: 5.4 10*3/uL (ref 1.7–7.7)
Neutrophils Relative %: 56 % (ref 43–77)
Platelets: 217 10*3/uL (ref 150–400)
RBC: 3.29 MIL/uL — ABNORMAL LOW (ref 4.22–5.81)
RDW: 15.5 % (ref 11.5–15.5)
WBC: 9.7 10*3/uL (ref 4.0–10.5)

## 2013-02-18 LAB — COMPREHENSIVE METABOLIC PANEL
ALT: 29 U/L (ref 0–53)
AST: 35 U/L (ref 0–37)
Albumin: 1.9 g/dL — ABNORMAL LOW (ref 3.5–5.2)
Alkaline Phosphatase: 114 U/L (ref 39–117)
BUN: 38 mg/dL — AB (ref 6–23)
CALCIUM: 9.1 mg/dL (ref 8.4–10.5)
CO2: 28 mEq/L (ref 19–32)
Chloride: 94 mEq/L — ABNORMAL LOW (ref 96–112)
Creatinine, Ser: 0.78 mg/dL (ref 0.50–1.35)
GFR calc Af Amer: >90 mL/min (ref >90)
GFR, EST NON AFRICAN AMERICAN: 78 mL/min — AB (ref >90)
Glucose, Bld: 105 mg/dL — ABNORMAL HIGH (ref 70–99)
Potassium: 4.7 mEq/L (ref 3.7–5.3)
Sodium: 131 mEq/L — ABNORMAL LOW (ref 137–147)
Total Bilirubin: 0.3 mg/dL (ref 0.3–1.2)
Total Protein: 5.6 g/dL — ABNORMAL LOW (ref 6.0–8.3)

## 2013-02-18 LAB — PREALBUMIN: PREALBUMIN: 11.7 mg/dL — AB (ref 17.0–34.0)

## 2013-02-18 LAB — C-REACTIVE PROTEIN: CRP: 7.4 mg/dL — AB (ref <0.60)

## 2013-02-18 LAB — SEDIMENTATION RATE: SED RATE: 58 mm/h — AB (ref 0–16)

## 2013-02-18 NOTE — Consult Note (Signed)
Referring Physician: Dr. Laren Everts  HPI: Glenn Barron is an 80 y.o. male who presented 11/25 with right foot swelling and redness, found to have cellulitis and osteomyelitis of the 5th digit. The patient has been on multiple antibiotic regimen and also treated for possible aspiration versus HCAP. His oxygen saturation is 98% on 3L. He underwent swallow evaluation and is on dysphagia diet with risk for aspiration and with malnutrition concern. IR received request for image guided percutaneous gastric tube placement. Patient has PAF and on chronic coumadin. This has been held and vitamin K has been given in preparation of procedure. However, INR continued to rise. Procedure was held and medical management continued. Coumadin has now been discontinued. Prognosis is poor despite overall condition improved. Family again wishes to move forward with G-tube for long term TF purposes.  Past Medical History:  Past Medical History   Diagnosis  Date   .  Allergic rhinitis, cause unspecified    .  Unspecified chronic bronchitis    .  Other pulmonary embolism and infarction    .  Paroxysmal a-fib    .  Cerebrovascular disease, unspecified  10/2002     right side weakness   .  Peripheral vascular disease, unspecified    .  Unspecified venous (peripheral) insufficiency    .  Acute venous embolism and thrombosis of unspecified deep vessels of lower extremity    .  Hyperlipidemia    .  GERD (gastroesophageal reflux disease)    .  Diverticulosis of colon (without mention of hemorrhage)    .  Irritable bowel syndrome    .  Benign neoplasm of colon    .  Unspecified hereditary and idiopathic peripheral neuropathy    .  Depression    .  Right bundle branch block    .  History of scarlet fever  1944   .  Aortic stenosis, mild      mild by echo 04/2011   .  Heart murmur    .  Hypertension      Dr. Johnsie Cancel cardiologist ; "haven't taken anything for a couple years since losing weight" (01/01/2013)   .  DVT (deep  venous thrombosis)      "put filter in to prevent" (01/01/2013)   .  Asthma      "mild" (01/01/2013)   .  Pneumonia      "a couple times" (01/01/2013)   .  Exertional shortness of breath      "as I get older" (01/01/2013)   .  Unspecified cerebral artery occlusion with cerebral infarction    .  Stroke  09/2002     "right foot still drags" (01/01/2013)   .  Osteoarthrosis, unspecified whether generalized or localized, unspecified site    .  Arthritis      "both ankles" (01/01/2013)   .  DDD (degenerative disc disease)    .  Malignant neoplasm of prostate    .  Skin cancer      "off my face" (01/01/2013)    Past Surgical History:  Past Surgical History   Procedure  Laterality  Date   .  Prostatectomy     .  Cataract extraction w/ intraocular lens implant, bilateral  Bilateral    .  Carotid endarterectomy  Right    .  Abdominal aortic aneurysm repair     .  Skin cancer excision       "left face and arms" (01/01/2013)   .  Tonsillectomy and adenoidectomy     .  Inguinal hernia repair  Left  1991   .  Inguinal hernia repair  Right  1930's   .  Soft tissue mass excision   1957     "off left arm and back" (01/01/2013)   .  Vena cava filter placement  Right     Family History:  Family History   Problem  Relation  Age of Onset   .  Anesthesia problems  Neg Hx    .  Heart attack  Father    .  Hyperlipidemia  Mother    .  Hypertension  Mother    .  Hypertension  Sister     Social History: reports that he has never smoked. He has never used smokeless tobacco. He reports that he drinks alcohol. He reports that he does not use illicit drugs.   Allergies:  Allergies   Allergen  Reactions   .  Doxycycline      Rash   .  Amoxicillin  Rash     Yeast infection      Medication List     ASK your doctor about these medications       acetaminophen 650 MG CR tablet    Commonly known as: TYLENOL    Take 650 mg by mouth 2 (two) times daily.    albuterol 108 (90 BASE) MCG/ACT  inhaler    Commonly known as: PROVENTIL HFA;VENTOLIN HFA    Inhale 1-2 puffs into the lungs every 6 (six) hours as needed for wheezing or shortness of breath.    ALIGN PO    Take 1 capsule by mouth daily.    aspirin 81 MG tablet    Take 81 mg by mouth daily.    atorvastatin 20 MG tablet    Commonly known as: LIPITOR    take 1 tablet by mouth once daily    bisacodyl 10 MG suppository    Commonly known as: DULCOLAX    Place 1 suppository (10 mg total) rectally daily as needed for moderate constipation.    calcium carbonate 500 MG chewable tablet    Commonly known as: TUMS - dosed in mg elemental calcium    Chew 1-2 tablets by mouth daily as needed for indigestion or heartburn. As needed    cholecalciferol 1000 UNITS tablet    Commonly known as: VITAMIN D    Take 1,000 Units by mouth daily.    dextrose 5 % SOLN 50 mL with ceFEPIme 1 G SOLR 1 g    Inject 1 g into the vein every 12 (twelve) hours.    famotidine 10 MG tablet    Commonly known as: PEPCID    Take 10 mg by mouth at bedtime.    fluticasone 50 MCG/ACT nasal spray    Commonly known as: FLONASE    instill 2 sprays into each nostril once daily    Fluticasone-Salmeterol 250-50 MCG/DOSE Aepb    Commonly known as: ADVAIR    Inhale 1 puff into the lungs 2 (two) times daily. Rinse mouth well after use    food thickener Powd    Commonly known as: THICK IT    Take 227 g by mouth as needed (for fluid, nectar thick).    furosemide 10 MG/ML injection    Commonly known as: LASIX    Inject 4 mLs (40 mg total) into the vein daily.    loratadine 10 MG tablet    Commonly known as: CLARITIN    Take 10 mg by mouth daily.  ondansetron 4 MG tablet    Commonly known as: ZOFRAN    Take 1 tablet (4 mg total) by mouth every 6 (six) hours as needed for nausea.    oxyCODONE 5 MG immediate release tablet    Commonly known as: Oxy IR/ROXICODONE    Take 1 tablet (5 mg total) by mouth every 6 (six) hours as needed for severe pain or breakthrough  pain.    polyethylene glycol packet    Commonly known as: MIRALAX / GLYCOLAX    Take 17 g by mouth as needed.    predniSONE 10 MG tablet    Commonly known as: DELTASONE    Take 5 mg by mouth every other day.    senna-docusate 8.6-50 MG per tablet    Commonly known as: Senokot-S    Take 2 tablets by mouth 2 (two) times daily.    Vancomycin 750 MG/150ML Soln    Commonly known as: VANCOCIN    Inject 150 mLs (750 mg total) into the vein every 12 (twelve) hours.    Please HPI for pertinent positives, otherwise complete 10 system ROS negative.   Physical Exam:  T: 97.2 F, HR: 76bpm, O2: 98% on 3L  General Appearance:  Not very alert, nontoxic appearing, appears stated age   Head:  Normocephalic, without obvious abnormality, atraumatic   Neck:  Supple, symmetrical, trachea midline   Lungs:  Scattered crackles bilaterally, respirations unlabored without use of accessory muscles.   Chest Wall:  No tenderness or deformity   Heart:  Regular rate and rhythm, S1, S2 normal, no murmur, rub or gallop.   Abdomen:  Soft, non-tender, non distended, (+) BS   Neurologic:  Normal affect, no gross deficits.    Labs: CBC    Component Value Date/Time   WBC 9.7 02/18/2013 0615   RBC 3.29* 02/18/2013 0615   HGB 10.8* 02/18/2013 0615   HCT 31.2* 02/18/2013 0615   PLT 217 02/18/2013 0615   MCV 94.8 02/18/2013 0615   MCH 32.8 02/18/2013 0615   MCHC 34.6 02/18/2013 0615   RDW 15.5 02/18/2013 0615   LYMPHSABS 2.8 02/18/2013 0615   MONOABS 1.2* 02/18/2013 0615   EOSABS 0.3 02/18/2013 0615   BASOSABS 0.0 02/18/2013 0615    BMET    Component Value Date/Time   NA 131* 02/18/2013 0615   K 4.7 02/18/2013 0615   CL 94* 02/18/2013 0615   CO2 28 02/18/2013 0615   GLUCOSE 105* 02/18/2013 0615   BUN 38* 02/18/2013 0615   CREATININE 0.78 02/18/2013 0615   CREATININE 1.11 07/17/2012 1547   CALCIUM 9.1 02/18/2013 0615   GFRNONAA 78* 02/18/2013 0615   GFRAA >90 02/18/2013 0615   Prothrombin Time/INR 14.1/1.11   Dg Abd  Portable 1v  02/02/2013 CLINICAL DATA: Nasogastric tube placement EXAM: PORTABLE ABDOMEN - 1 VIEW COMPARISON: Study obtained earlier in the day FINDINGS: Nasogastric tube tip is in the gastric cardia region. The side port is at the gastroesophageal junction. The bowel gas pattern is unremarkable. There is extensive arterial calcification in the pelvis. There is a filter in the inferior vena cava with the apex at the level of L1-2 on the right. IMPRESSION: Nasogastric tube side port at the gastroesophageal junction. Suggest advancing nasogastric tube 8-10 cm. Bowel gas pattern unremarkable. Electronically Signed By: Lowella Grip M.D. On: 02/02/2013 08:00  Dg Abd Portable 1v  02/02/2013 CLINICAL DATA: Evaluate NG tube placement EXAM: PORTABLE ABDOMEN - 1 VIEW COMPARISON: 02/01/2013 FINDINGS: Significantly diminished exam detail due to motion artifact.  Enteric tube is identified with tip below the GE junction. The tip is coiled within the proximal stomach. IVC filter is noted. There is chronic lung changes identified bilaterally. IMPRESSION: 1. NG tube coiled within the proximal stomach. Electronically Signed By: Kerby Moors M.D. On: 02/02/2013 01:09  Dg Abd Portable 1v  02/01/2013 CLINICAL DATA: Nasogastric tube placement. EXAM: PORTABLE ABDOMEN - 1 VIEW COMPARISON: CTA of the abdomen, pelvis and lower extremities performed 01/03/2013 FINDINGS: The patient's nasogastric tube is not visualized. It may be coiled within the patient's mouth. The visualized bowel gas pattern is grossly unremarkable. Contrast is seen partially filling the colon. An IVC filter is noted. Scattered postoperative change is noted about the lower abdomen. The patient's right PICC was noted ending within the right atrium on the prior study. No acute osseous abnormalities are seen. The visualized lung bases demonstrate bibasilar airspace opacification, better characterized on the prior chest radiograph. IMPRESSION: Nasogastric tube not  visualized on this study; it may be coiled within the patient's mouth. These results were called by telephone at the time of interpretation on 02/01/2013 at 10:24 PM to Nursing in [facility], who verbally acknowledged these results. Electronically Signed By: Garald Balding M.D. On: 02/01/2013 22:25   Assessment/Plan  Respiratory failure.  Dysphagia with NGT in place.  Malnutrition  Request for image guided percutaneous gastric tube placement.  PAF no longer on Coumadin, INR 1.11 Leukocytosis resolved. Right foot osteomyelitis on antibiotics.  PVD.  AAA s/p repair.  Prostate cancer s/p prostatectomy.  History of prior DVT and PE.  Mild AS.  Will give barium tonight via NGT. Check KUB in am. Consent to be obtained again from family.  Ascencion Dike PA-C Interventional Radiology 02/18/2013 3:57 PM

## 2013-02-19 ENCOUNTER — Other Ambulatory Visit (HOSPITAL_COMMUNITY): Payer: Self-pay

## 2013-02-19 LAB — PROTIME-INR
INR: 0.97 (ref 0.00–1.49)
Prothrombin Time: 12.7 seconds (ref 11.6–15.2)

## 2013-02-19 MED ORDER — FENTANYL CITRATE 0.05 MG/ML IJ SOLN
INTRAMUSCULAR | Status: AC
  Filled 2013-02-19: qty 4

## 2013-02-19 MED ORDER — MIDAZOLAM HCL 2 MG/2ML IJ SOLN
INTRAMUSCULAR | Status: AC | PRN
  Administered 2013-02-19 (×2): 1 mg via INTRAVENOUS

## 2013-02-19 MED ORDER — MIDAZOLAM HCL 2 MG/2ML IJ SOLN
INTRAMUSCULAR | Status: AC
  Filled 2013-02-19: qty 6

## 2013-02-19 MED ORDER — IOHEXOL 300 MG/ML  SOLN: 50.0000 mL | Freq: Once | INTRAMUSCULAR | Status: AC | PRN

## 2013-02-19 MED ORDER — IOHEXOL 300 MG/ML  SOLN
50.0000 mL | Freq: Once | INTRAMUSCULAR | Status: AC | PRN
  Administered 2013-02-19: 20 mL via INTRAVENOUS

## 2013-02-19 MED ORDER — FENTANYL CITRATE 0.05 MG/ML IJ SOLN
INTRAMUSCULAR | Status: AC | PRN
  Administered 2013-02-19 (×2): 25 ug via INTRAVENOUS

## 2013-02-19 MED ORDER — GLUCAGON HCL (RDNA) 1 MG IJ SOLR
INTRAMUSCULAR | Status: AC
  Filled 2013-02-19: qty 1

## 2013-02-19 NOTE — ED Notes (Signed)
NG tube removed at time of G tube insertion

## 2013-02-19 NOTE — Procedures (Signed)
Interventional Radiology Procedure Note  Procedure: Placement of percutaneous 15F pull-through gastrostomy tube. Complications: None Recommendations: - NPO except for sips and chips remainder of today and overnight - Maintain G-tube to LWS until tomorrow morning  - May advance diet as tolerated and begin using tube tomorrow at 15:00  Signed,  Criselda Peaches, MD [facility]

## 2013-02-19 NOTE — ED Notes (Signed)
Patient denies pain and is resting comfortably with eyes closed 

## 2013-02-19 NOTE — ED Notes (Signed)
Ancef 2 GM IV started per orders

## 2013-02-20 ENCOUNTER — Other Ambulatory Visit (HOSPITAL_COMMUNITY): Payer: Self-pay

## 2013-02-20 LAB — BASIC METABOLIC PANEL
BUN: 40 mg/dL — ABNORMAL HIGH (ref 6–23)
CALCIUM: 10.4 mg/dL (ref 8.4–10.5)
CO2: 25 meq/L (ref 19–32)
Chloride: 98 mEq/L (ref 96–112)
Creatinine, Ser: 0.99 mg/dL (ref 0.50–1.35)
GFR calc Af Amer: 82 mL/min — ABNORMAL LOW (ref >90)
GFR calc non Af Amer: 71 mL/min — ABNORMAL LOW (ref >90)
GLUCOSE: 82 mg/dL (ref 70–99)
Potassium: 4.6 mEq/L (ref 3.7–5.3)
SODIUM: 137 meq/L (ref 137–147)

## 2013-02-20 LAB — CBC WITH DIFFERENTIAL/PLATELET
Basophils Absolute: 0 10*3/uL (ref 0.0–0.1)
Basophils Relative: 0 % (ref 0–1)
EOS PCT: 1 % (ref 0–5)
Eosinophils Absolute: 0.1 10*3/uL (ref 0.0–0.7)
HEMATOCRIT: 34.9 % — AB (ref 39.0–52.0)
Hemoglobin: 12 g/dL — ABNORMAL LOW (ref 13.0–17.0)
LYMPHS PCT: 16 % (ref 12–46)
Lymphs Abs: 2.9 10*3/uL (ref 0.7–4.0)
MCH: 33.1 pg (ref 26.0–34.0)
MCHC: 34.4 g/dL (ref 30.0–36.0)
MCV: 96.1 fL (ref 78.0–100.0)
MONO ABS: 2 10*3/uL — AB (ref 0.1–1.0)
Monocytes Relative: 11 % (ref 3–12)
Neutro Abs: 13.6 10*3/uL — ABNORMAL HIGH (ref 1.7–7.7)
Neutrophils Relative %: 73 % (ref 43–77)
PLATELETS: 273 10*3/uL (ref 150–400)
RBC: 3.63 MIL/uL — AB (ref 4.22–5.81)
RDW: 15.6 % — ABNORMAL HIGH (ref 11.5–15.5)
WBC: 18.6 10*3/uL — AB (ref 4.0–10.5)

## 2013-02-20 LAB — URINALYSIS, ROUTINE W REFLEX MICROSCOPIC
Bilirubin Urine: NEGATIVE
Glucose, UA: NEGATIVE mg/dL
HGB URINE DIPSTICK: NEGATIVE
Ketones, ur: NEGATIVE mg/dL
Leukocytes, UA: NEGATIVE
Nitrite: NEGATIVE
PROTEIN: NEGATIVE mg/dL
Specific Gravity, Urine: 1.018 (ref 1.005–1.030)
Urobilinogen, UA: 0.2 mg/dL (ref 0.0–1.0)
pH: 5 (ref 5.0–8.0)

## 2013-02-21 LAB — CBC WITH DIFFERENTIAL/PLATELET
Basophils Absolute: 0 10*3/uL (ref 0.0–0.1)
Basophils Relative: 0 % (ref 0–1)
EOS ABS: 0.1 10*3/uL (ref 0.0–0.7)
Eosinophils Relative: 1 % (ref 0–5)
HCT: 33.9 % — ABNORMAL LOW (ref 39.0–52.0)
Hemoglobin: 11.2 g/dL — ABNORMAL LOW (ref 13.0–17.0)
Lymphocytes Relative: 17 % (ref 12–46)
Lymphs Abs: 2.8 10*3/uL (ref 0.7–4.0)
MCH: 32.3 pg (ref 26.0–34.0)
MCHC: 33 g/dL (ref 30.0–36.0)
MCV: 97.7 fL (ref 78.0–100.0)
Monocytes Absolute: 1.5 10*3/uL — ABNORMAL HIGH (ref 0.1–1.0)
Monocytes Relative: 9 % (ref 3–12)
NEUTROS PCT: 73 % (ref 43–77)
Neutro Abs: 11.9 10*3/uL — ABNORMAL HIGH (ref 1.7–7.7)
PLATELETS: 253 10*3/uL (ref 150–400)
RBC: 3.47 MIL/uL — ABNORMAL LOW (ref 4.22–5.81)
RDW: 16 % — ABNORMAL HIGH (ref 11.5–15.5)
WBC: 16.4 10*3/uL — ABNORMAL HIGH (ref 4.0–10.5)

## 2013-02-21 LAB — COMPREHENSIVE METABOLIC PANEL
ALT: 19 U/L (ref 0–53)
AST: 28 U/L (ref 0–37)
Albumin: 2 g/dL — ABNORMAL LOW (ref 3.5–5.2)
Alkaline Phosphatase: 121 U/L — ABNORMAL HIGH (ref 39–117)
BUN: 52 mg/dL — ABNORMAL HIGH (ref 6–23)
CALCIUM: 10.2 mg/dL (ref 8.4–10.5)
CO2: 25 mEq/L (ref 19–32)
CREATININE: 1.06 mg/dL (ref 0.50–1.35)
Chloride: 99 mEq/L (ref 96–112)
GFR calc Af Amer: 70 mL/min — ABNORMAL LOW (ref >90)
GFR, EST NON AFRICAN AMERICAN: 61 mL/min — AB (ref >90)
Glucose, Bld: 146 mg/dL — ABNORMAL HIGH (ref 70–99)
Potassium: 4.3 mEq/L (ref 3.7–5.3)
Sodium: 138 mEq/L (ref 137–147)
Total Bilirubin: 0.3 mg/dL (ref 0.3–1.2)
Total Protein: 5.6 g/dL — ABNORMAL LOW (ref 6.0–8.3)

## 2013-02-21 LAB — PROTIME-INR
INR: 1.08 (ref 0.00–1.49)
PROTHROMBIN TIME: 13.8 s (ref 11.6–15.2)

## 2013-02-22 LAB — CBC
HEMATOCRIT: 34.1 % — AB (ref 39.0–52.0)
HEMOGLOBIN: 11.4 g/dL — AB (ref 13.0–17.0)
MCH: 32 pg (ref 26.0–34.0)
MCHC: 33.4 g/dL (ref 30.0–36.0)
MCV: 95.8 fL (ref 78.0–100.0)
Platelets: 204 10*3/uL (ref 150–400)
RBC: 3.56 MIL/uL — ABNORMAL LOW (ref 4.22–5.81)
RDW: 15.9 % — AB (ref 11.5–15.5)
WBC: 15.5 10*3/uL — ABNORMAL HIGH (ref 4.0–10.5)

## 2013-02-22 LAB — URINE CULTURE
COLONY COUNT: NO GROWTH
Culture: NO GROWTH

## 2013-02-22 LAB — BASIC METABOLIC PANEL
BUN: 50 mg/dL — ABNORMAL HIGH (ref 6–23)
CHLORIDE: 101 meq/L (ref 96–112)
CO2: 25 mEq/L (ref 19–32)
Calcium: 10 mg/dL (ref 8.4–10.5)
Creatinine, Ser: 0.92 mg/dL (ref 0.50–1.35)
GFR, EST AFRICAN AMERICAN: 85 mL/min — AB (ref >90)
GFR, EST NON AFRICAN AMERICAN: 73 mL/min — AB (ref >90)
Glucose, Bld: 122 mg/dL — ABNORMAL HIGH (ref 70–99)
POTASSIUM: 4.3 meq/L (ref 3.7–5.3)
SODIUM: 138 meq/L (ref 137–147)

## 2013-02-22 LAB — PROTIME-INR
INR: 1.11 (ref 0.00–1.49)
PROTHROMBIN TIME: 14.1 s (ref 11.6–15.2)

## 2013-02-23 LAB — PROTIME-INR
INR: 1.1 (ref 0.00–1.49)
Prothrombin Time: 14 seconds (ref 11.6–15.2)

## 2013-02-24 LAB — CBC
HCT: 35.4 % — ABNORMAL LOW (ref 39.0–52.0)
HEMOGLOBIN: 11.6 g/dL — AB (ref 13.0–17.0)
MCH: 32.1 pg (ref 26.0–34.0)
MCHC: 32.8 g/dL (ref 30.0–36.0)
MCV: 98.1 fL (ref 78.0–100.0)
PLATELETS: 215 10*3/uL (ref 150–400)
RBC: 3.61 MIL/uL — ABNORMAL LOW (ref 4.22–5.81)
RDW: 16 % — AB (ref 11.5–15.5)
WBC: 17.2 10*3/uL — ABNORMAL HIGH (ref 4.0–10.5)

## 2013-02-24 LAB — BASIC METABOLIC PANEL
BUN: 45 mg/dL — ABNORMAL HIGH (ref 6–23)
CALCIUM: 9.9 mg/dL (ref 8.4–10.5)
CO2: 26 mEq/L (ref 19–32)
Chloride: 101 mEq/L (ref 96–112)
Creatinine, Ser: 0.79 mg/dL (ref 0.50–1.35)
GFR calc non Af Amer: 78 mL/min — ABNORMAL LOW (ref >90)
Glucose, Bld: 112 mg/dL — ABNORMAL HIGH (ref 70–99)
POTASSIUM: 4.7 meq/L (ref 3.7–5.3)
Sodium: 139 mEq/L (ref 137–147)

## 2013-02-24 LAB — PROTIME-INR
INR: 0.97 (ref 0.00–1.49)
PROTHROMBIN TIME: 12.7 s (ref 11.6–15.2)

## 2013-02-24 LAB — MAGNESIUM: MAGNESIUM: 2.1 mg/dL (ref 1.5–2.5)

## 2013-02-24 LAB — PHOSPHORUS: PHOSPHORUS: 2.9 mg/dL (ref 2.3–4.6)

## 2013-02-25 LAB — PROTIME-INR
INR: 1.1 (ref 0.00–1.49)
PROTHROMBIN TIME: 14 s (ref 11.6–15.2)

## 2013-02-26 LAB — PROTIME-INR
INR: 1.19 (ref 0.00–1.49)
Prothrombin Time: 14.8 seconds (ref 11.6–15.2)

## 2013-02-27 LAB — CBC WITH DIFFERENTIAL/PLATELET
Basophils Absolute: 0 10*3/uL (ref 0.0–0.1)
Basophils Relative: 0 % (ref 0–1)
EOS ABS: 0.3 10*3/uL (ref 0.0–0.7)
EOS PCT: 2 % (ref 0–5)
HCT: 39.1 % (ref 39.0–52.0)
HEMOGLOBIN: 12.9 g/dL — AB (ref 13.0–17.0)
LYMPHS ABS: 2.6 10*3/uL (ref 0.7–4.0)
Lymphocytes Relative: 12 % (ref 12–46)
MCH: 32.8 pg (ref 26.0–34.0)
MCHC: 33 g/dL (ref 30.0–36.0)
MCV: 99.5 fL (ref 78.0–100.0)
MONOS PCT: 8 % (ref 3–12)
Monocytes Absolute: 1.7 10*3/uL — ABNORMAL HIGH (ref 0.1–1.0)
Neutro Abs: 16.1 10*3/uL — ABNORMAL HIGH (ref 1.7–7.7)
Neutrophils Relative %: 78 % — ABNORMAL HIGH (ref 43–77)
PLATELETS: 267 10*3/uL (ref 150–400)
RBC: 3.93 MIL/uL — AB (ref 4.22–5.81)
RDW: 15.9 % — ABNORMAL HIGH (ref 11.5–15.5)
WBC: 20.8 10*3/uL — ABNORMAL HIGH (ref 4.0–10.5)

## 2013-02-27 LAB — PROTIME-INR
INR: 1.29 (ref 0.00–1.49)
Prothrombin Time: 15.8 seconds — ABNORMAL HIGH (ref 11.6–15.2)

## 2013-02-27 LAB — SEDIMENTATION RATE: Sed Rate: 71 mm/hr — ABNORMAL HIGH (ref 0–16)

## 2013-02-27 LAB — BASIC METABOLIC PANEL
BUN: 43 mg/dL — AB (ref 6–23)
CALCIUM: 10.2 mg/dL (ref 8.4–10.5)
CO2: 24 mEq/L (ref 19–32)
Chloride: 101 mEq/L (ref 96–112)
Creatinine, Ser: 0.73 mg/dL (ref 0.50–1.35)
GFR calc Af Amer: >90 mL/min (ref >90)
GFR, EST NON AFRICAN AMERICAN: 80 mL/min — AB (ref >90)
GLUCOSE: 113 mg/dL — AB (ref 70–99)
Potassium: 5.1 mEq/L (ref 3.7–5.3)
Sodium: 138 mEq/L (ref 137–147)

## 2013-02-27 LAB — PHOSPHORUS: Phosphorus: 3 mg/dL (ref 2.3–4.6)

## 2013-02-27 LAB — C-REACTIVE PROTEIN: CRP: 7.1 mg/dL — ABNORMAL HIGH (ref <0.60)

## 2013-02-27 LAB — MAGNESIUM: Magnesium: 2.2 mg/dL (ref 1.5–2.5)

## 2013-02-28 LAB — PROTIME-INR
INR: 1.61 — AB (ref 0.00–1.49)
Prothrombin Time: 18.7 seconds — ABNORMAL HIGH (ref 11.6–15.2)

## 2013-03-01 LAB — CBC WITH DIFFERENTIAL/PLATELET
BASOS ABS: 0 10*3/uL (ref 0.0–0.1)
Basophils Relative: 0 % (ref 0–1)
EOS ABS: 0.3 10*3/uL (ref 0.0–0.7)
Eosinophils Relative: 2 % (ref 0–5)
HCT: 35.4 % — ABNORMAL LOW (ref 39.0–52.0)
Hemoglobin: 11.6 g/dL — ABNORMAL LOW (ref 13.0–17.0)
LYMPHS ABS: 2.7 10*3/uL (ref 0.7–4.0)
LYMPHS PCT: 14 % (ref 12–46)
MCH: 32.3 pg (ref 26.0–34.0)
MCHC: 32.8 g/dL (ref 30.0–36.0)
MCV: 98.6 fL (ref 78.0–100.0)
Monocytes Absolute: 1.8 10*3/uL — ABNORMAL HIGH (ref 0.1–1.0)
Monocytes Relative: 10 % (ref 3–12)
Neutro Abs: 13.8 10*3/uL — ABNORMAL HIGH (ref 1.7–7.7)
Neutrophils Relative %: 74 % (ref 43–77)
PLATELETS: 245 10*3/uL (ref 150–400)
RBC: 3.59 MIL/uL — AB (ref 4.22–5.81)
RDW: 16 % — AB (ref 11.5–15.5)
WBC: 18.5 10*3/uL — AB (ref 4.0–10.5)

## 2013-03-01 LAB — BASIC METABOLIC PANEL
BUN: 44 mg/dL — ABNORMAL HIGH (ref 6–23)
CO2: 25 mEq/L (ref 19–32)
Calcium: 9.9 mg/dL (ref 8.4–10.5)
Chloride: 103 mEq/L (ref 96–112)
Creatinine, Ser: 0.71 mg/dL (ref 0.50–1.35)
GFR calc Af Amer: >90 mL/min (ref >90)
GFR, EST NON AFRICAN AMERICAN: 81 mL/min — AB (ref >90)
GLUCOSE: 123 mg/dL — AB (ref 70–99)
Potassium: 5.3 mEq/L (ref 3.7–5.3)
SODIUM: 139 meq/L (ref 137–147)

## 2013-03-01 LAB — PROTIME-INR
INR: 1.68 — ABNORMAL HIGH (ref 0.00–1.49)
PROTHROMBIN TIME: 19.3 s — AB (ref 11.6–15.2)

## 2013-03-02 LAB — PROTIME-INR
INR: 1.8 — AB (ref 0.00–1.49)
PROTHROMBIN TIME: 20.4 s — AB (ref 11.6–15.2)

## 2013-03-10 DEATH — deceased

## 2013-03-11 ENCOUNTER — Ambulatory Visit: Payer: Medicare Other | Admitting: Pulmonary Disease

## 2013-03-20 NOTE — Progress Notes (Signed)
G tube placement with moderate sedation

## 2013-07-23 ENCOUNTER — Ambulatory Visit: Payer: Medicare Other | Admitting: Vascular Surgery

## 2013-07-23 ENCOUNTER — Other Ambulatory Visit: Payer: Medicare Other

## 2013-07-30 ENCOUNTER — Ambulatory Visit: Payer: Medicare Other | Admitting: Vascular Surgery

## 2014-09-08 IMAGING — CR DG ABD PORTABLE 1V
2 series · 2 of 2 positions shown · non-contrast
Comparison: CTA of the abdomen, pelvis and lower extremities
performed 01/03/2013

CLINICAL DATA: Nasogastric tube placement.

EXAM:
PORTABLE ABDOMEN - 1 VIEW

[AP (1 of 2)]
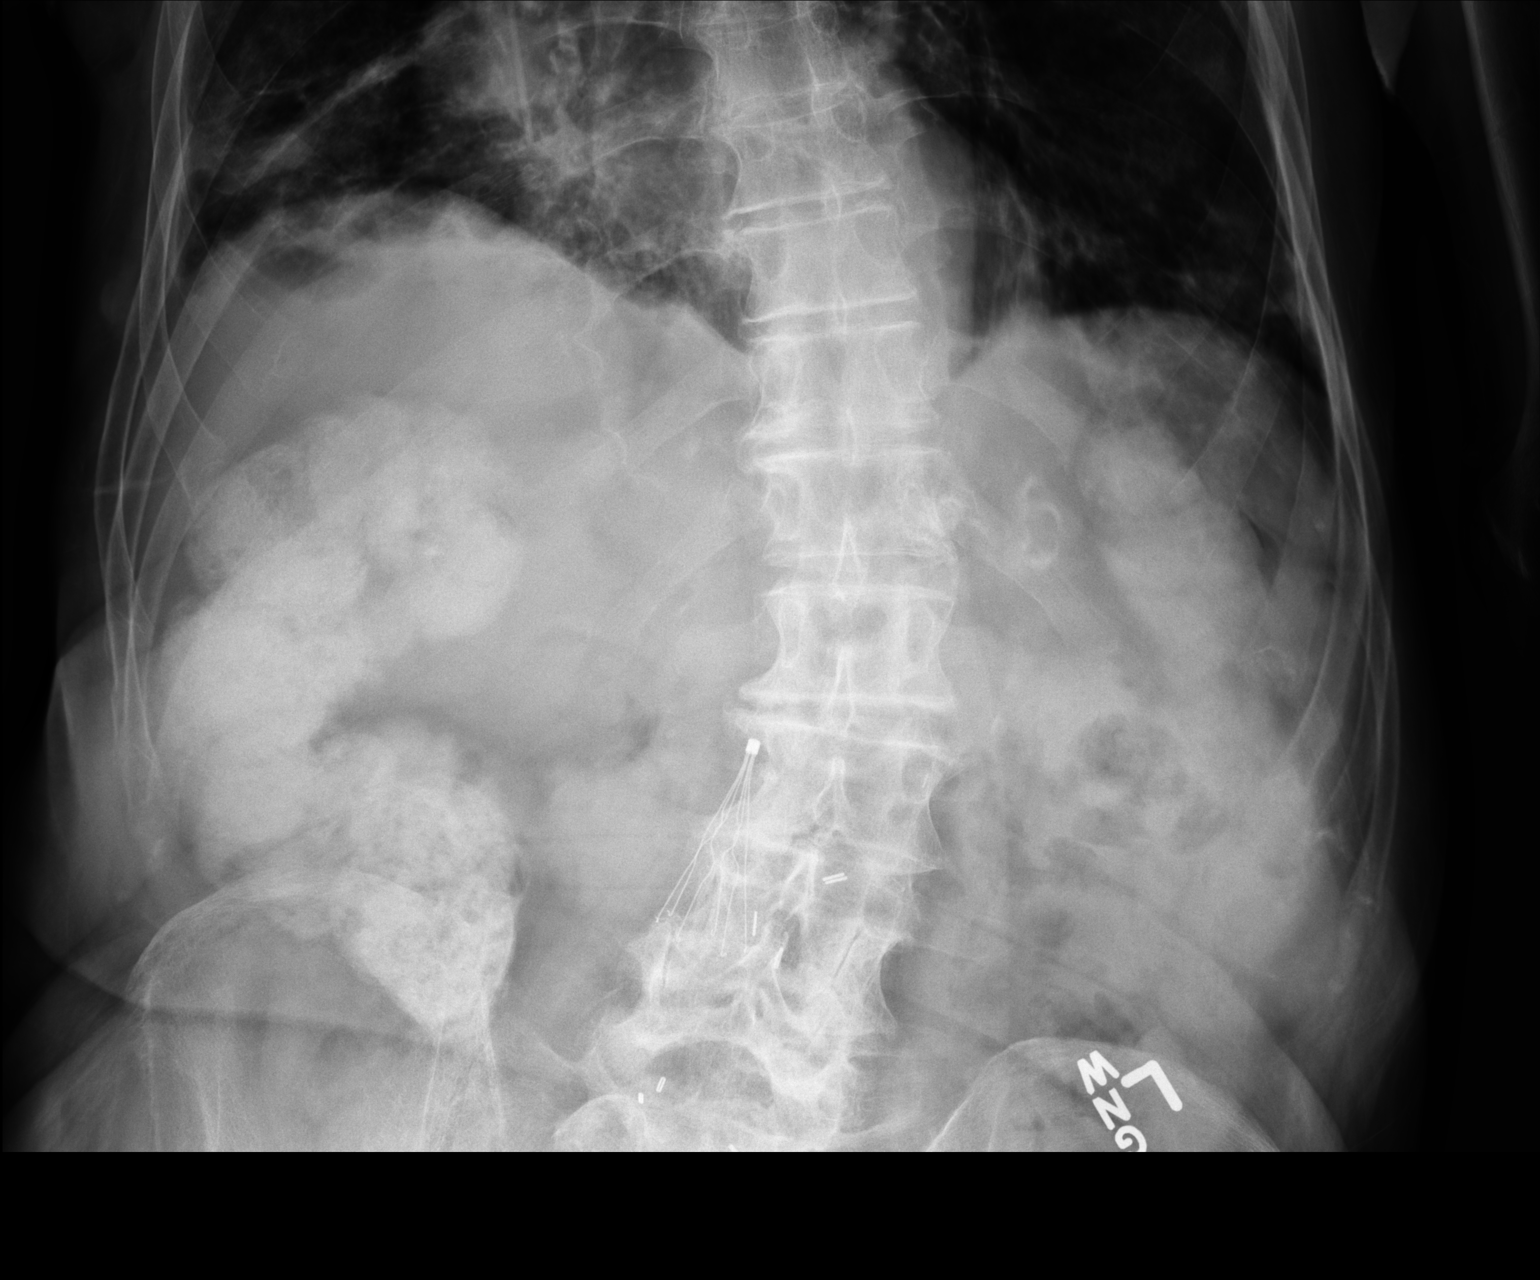

[AP (2 of 2)]
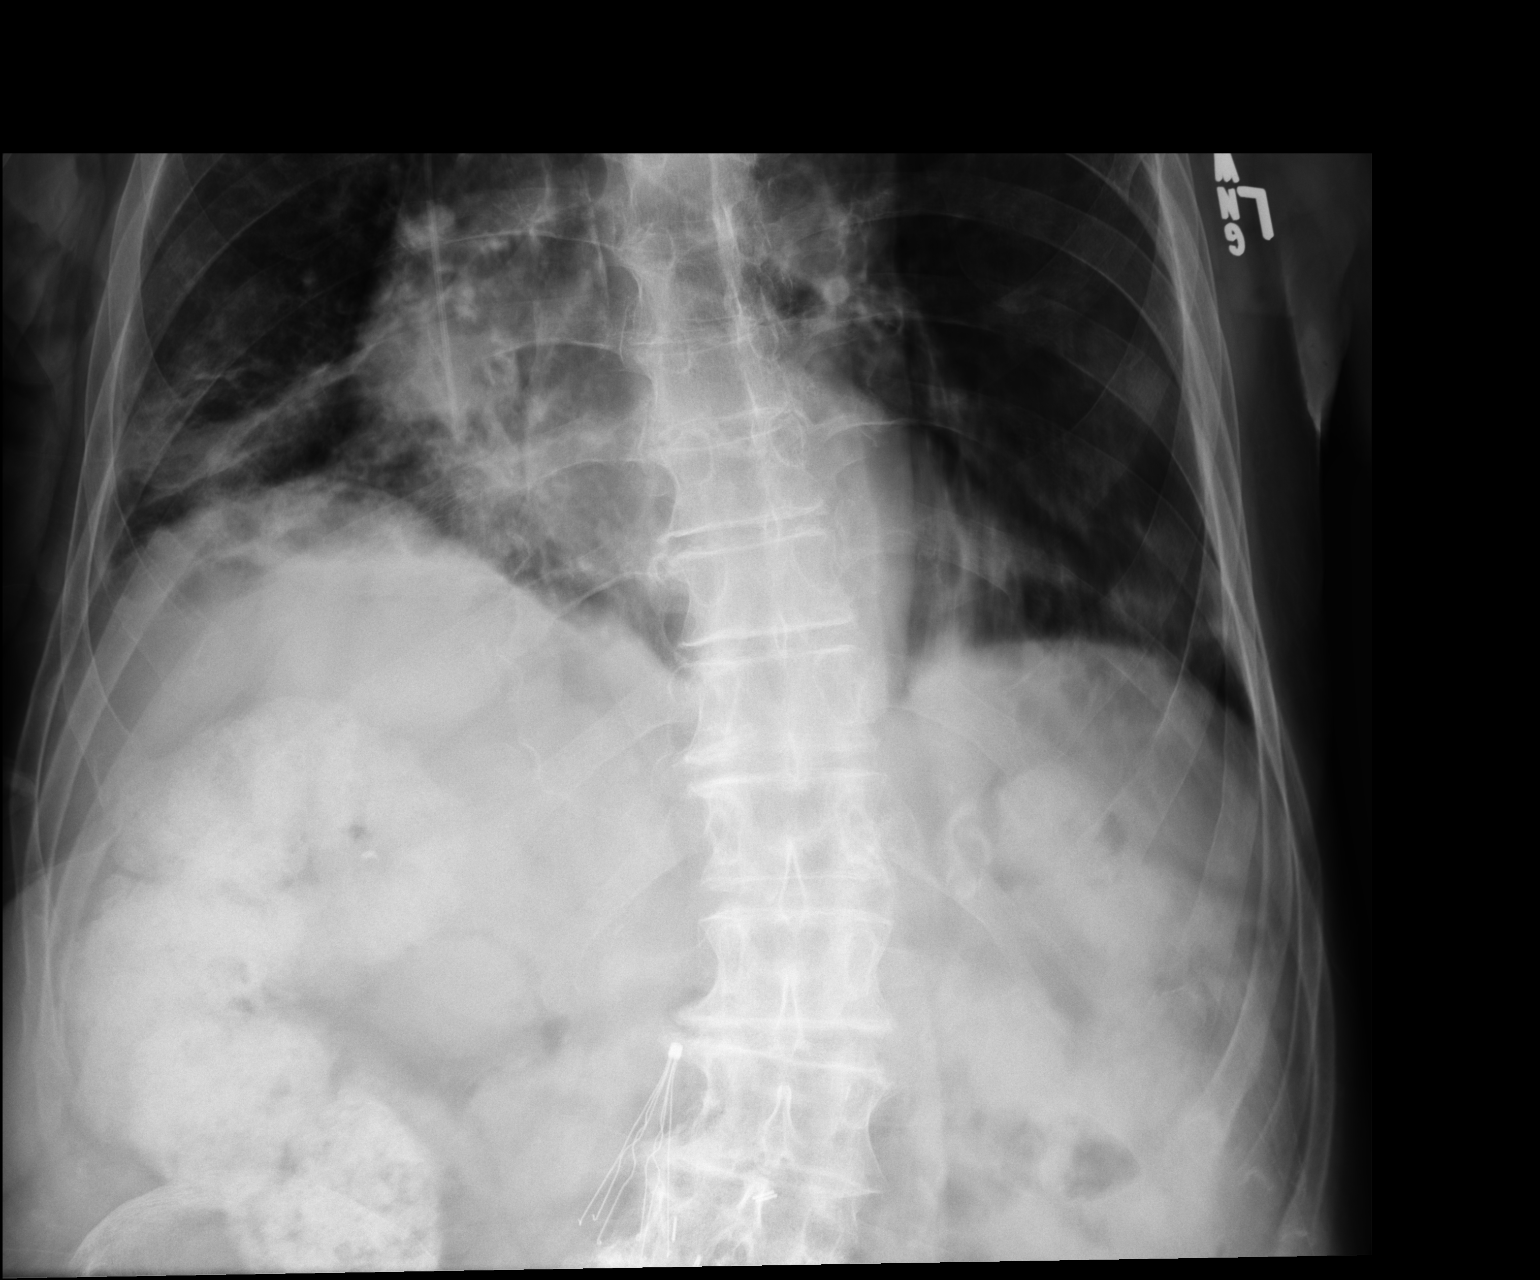

[2 of 2 positions shown; findings below may reference images not displayed]

FINDINGS: The patient's nasogastric tube is not visualized. It may be coiled
within the patient's mouth.

The visualized bowel gas pattern is grossly unremarkable. Contrast
is seen partially filling the colon. An IVC filter is noted.
Scattered postoperative change is noted about the lower abdomen.

The patient's right PICC was noted ending within the right atrium on
the prior study. No acute osseous abnormalities are seen. The
visualized lung bases demonstrate bibasilar airspace opacification,
better characterized on the prior chest radiograph.
IMPRESSION: Nasogastric tube not visualized on this study; it may be coiled
within the patient's mouth.

These results were called by telephone at the time of interpretation
on 02/01/2013 at [DATE] to Nursing in [REDACTED],
who verbally acknowledged these results.

## 2014-09-11 IMAGING — CR DG CHEST 1V PORT
1 series · 1 of 1 positions shown · non-contrast
Comparison: Chest x-ray 01/26/2013.

CLINICAL DATA: Pneumonia.

EXAM:
PORTABLE CHEST - 1 VIEW

[AP]
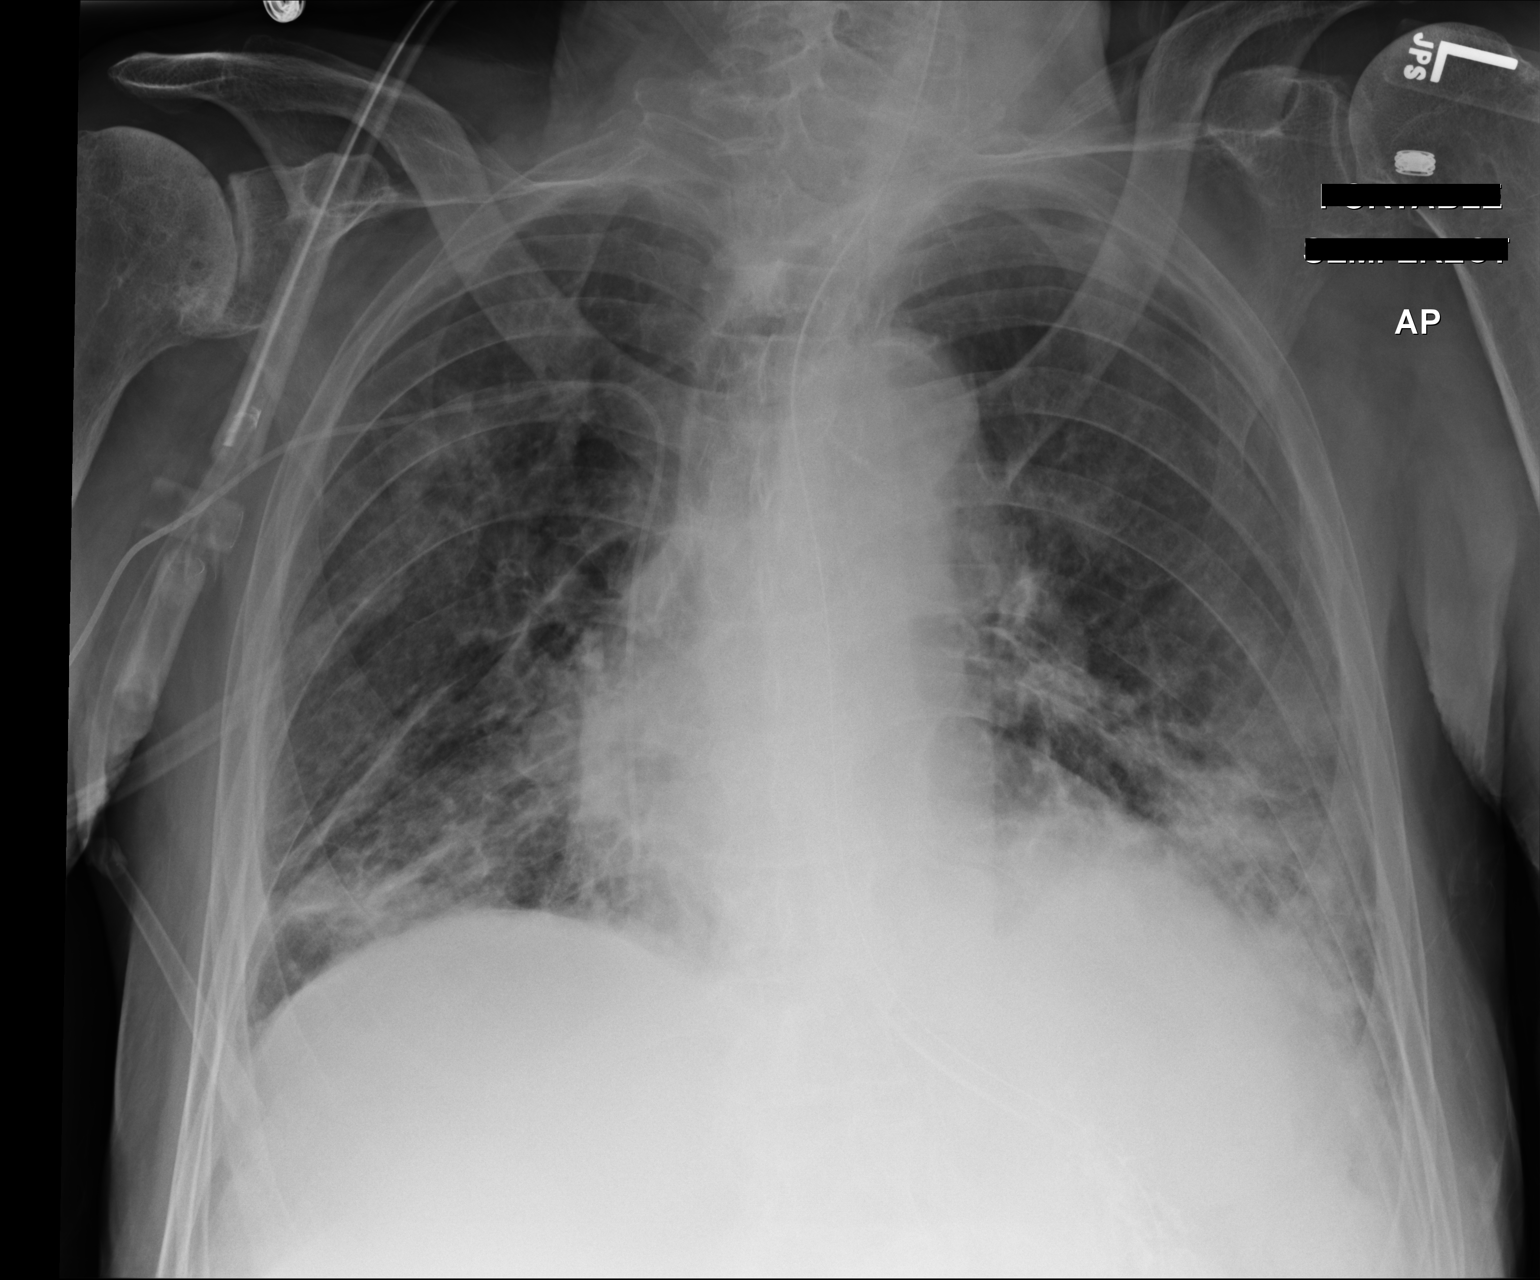

[1 of 1 positions shown; findings below may reference images not displayed]

FINDINGS: There is a right-sided subclavian central venous catheter with tip
terminating in the right atrium. A nasogastric tube is seen
extending into the stomach, however, the tip of the nasogastric tube
extends below the lower margin of the image. Lung volumes are low.
Patchy multifocal interstitial and airspace opacities throughout the
lungs bilaterally, most pronounced in the mid to lower lungs
bilaterally, particularly at the left base, concerning for
multilobar pneumonia. Probable small bilateral pleural effusions. No
definite signs of pulmonary edema. Heart size is upper limits of
normal. The patient is rotated to the left on today's exam,
resulting in distortion of the mediastinal contours and reduced
diagnostic sensitivity and specificity for mediastinal pathology.
Atherosclerosis in the thoracic aorta.
IMPRESSION: 1. Support apparatus, as above.
2. Otherwise, allowing for slight differences in patient
positioning, the radiographic appearance the chest is very similar
to the prior study, as detailed above.

## 2014-09-12 IMAGING — DX DG CHEST 1V PORT
1 series · 1 of 1 positions shown · non-contrast
Comparison: 02/04/2013

CLINICAL DATA: Followup pneumonia.

EXAM:
PORTABLE CHEST - 1 VIEW

[portable]
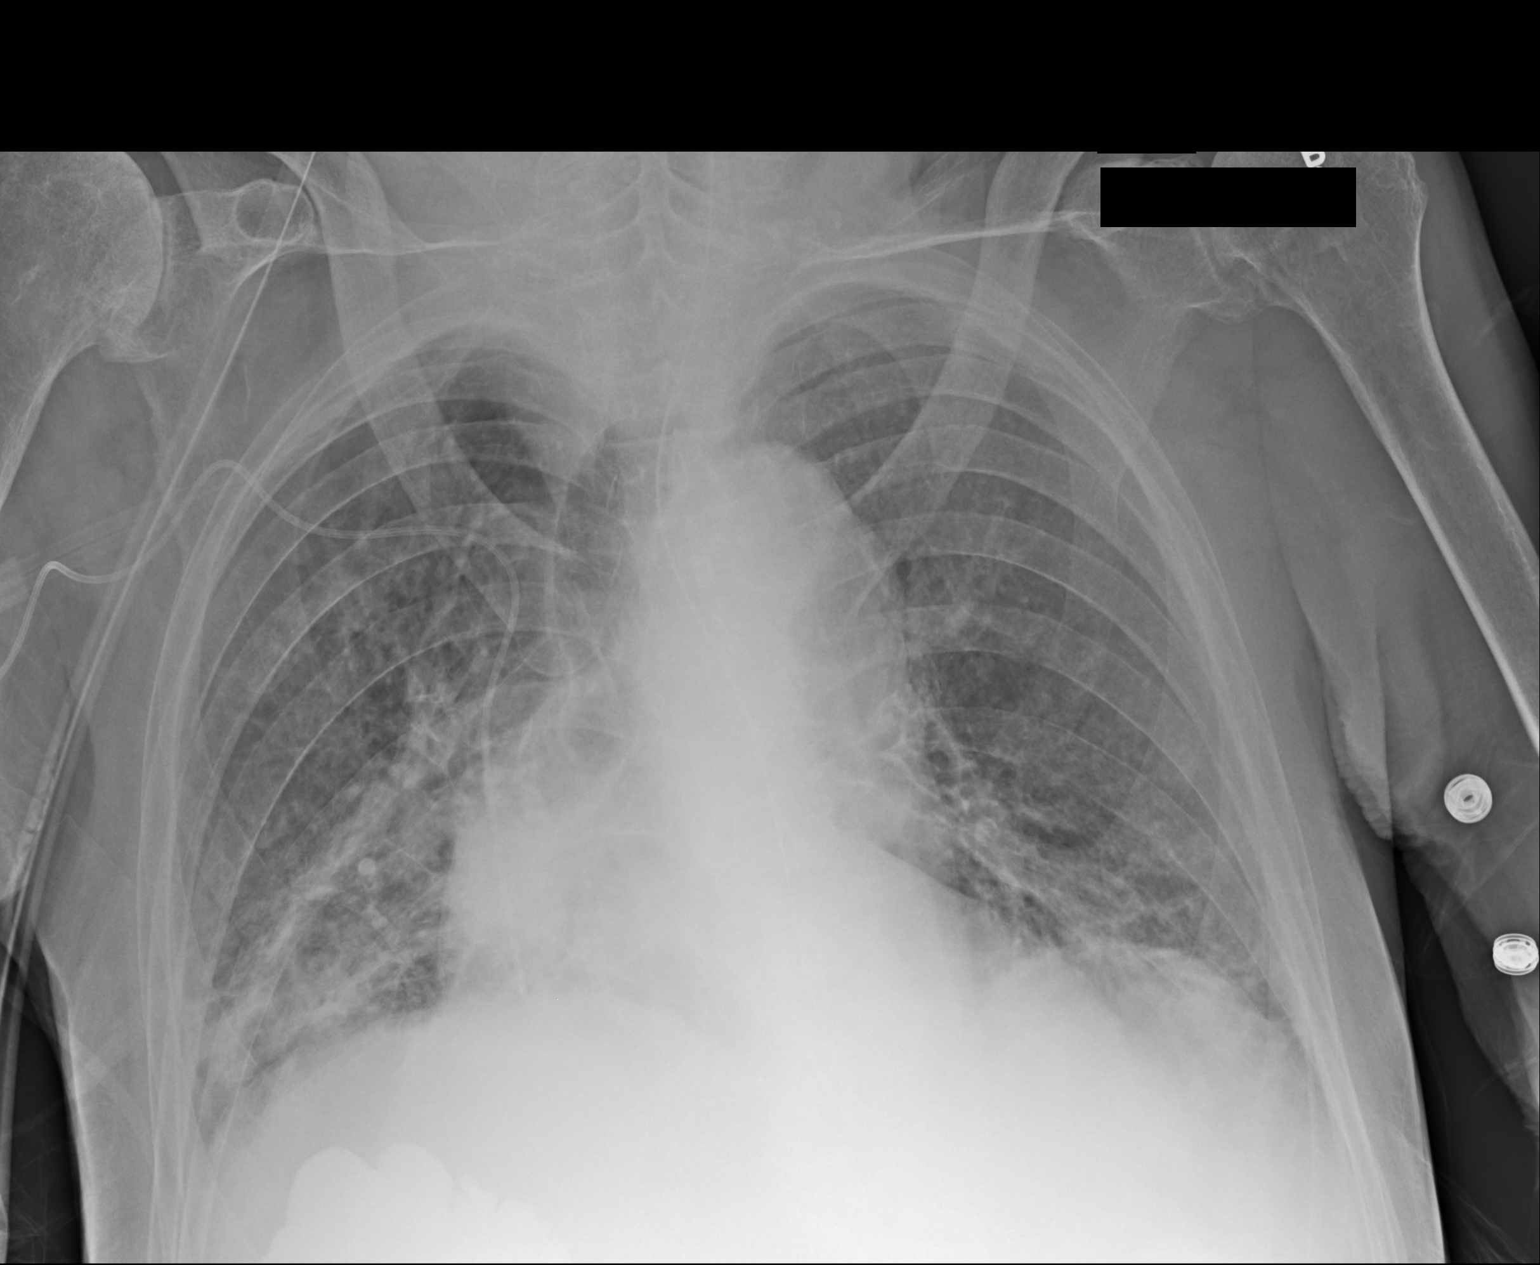

[1 of 1 positions shown; findings below may reference images not displayed]

FINDINGS: Nasogastric tube enters the abdomen. Right subclavian central line
has its tip in the right atrium. Patchy bronchopneumonia in both
lower lobes persists, possibly slightly improved on the left. No
worsening or new finding.
IMPRESSION: Persistent bilateral lower lobe bronchopneumonia, possibly with some
improvement on the left.

## 2014-09-14 IMAGING — CR DG CHEST 1V PORT
1 series · 1 of 1 positions shown · non-contrast
Comparison: 02/05/2013

CLINICAL DATA: Worsening pneumonia/respiratory failure.

EXAM:
PORTABLE CHEST - 1 VIEW

[AP]
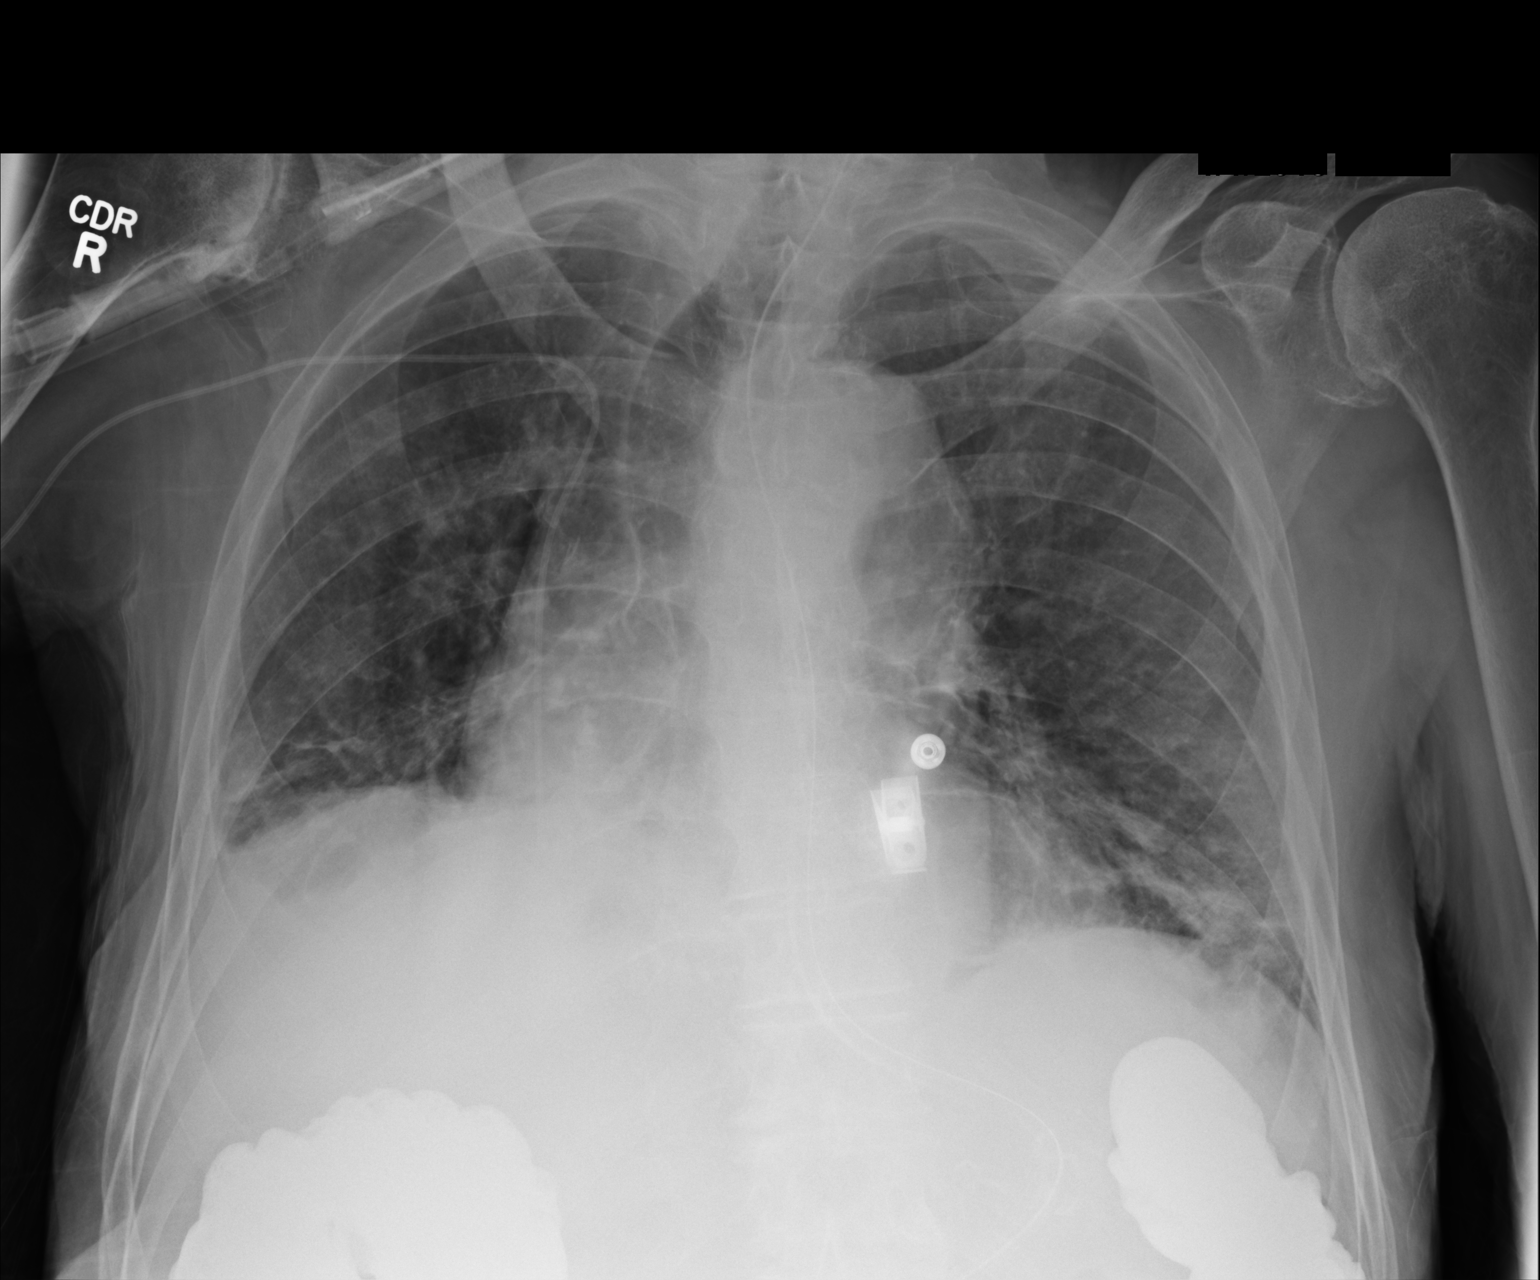

[1 of 1 positions shown; findings below may reference images not displayed]

FINDINGS: Right-sided PICC line and nasogastric tube unchanged. Lungs are
hypoinflated as patient is slightly rotated to the right. There is
continued bibasilar opacification without significant change which
may be due to atelectasis versus infection. Suggestion of a small
amount right pleural fluid. Remainder of the exam is unchanged.
IMPRESSION: Persistent mild bibasilar opacification which may be due to
atelectasis or infection. Possible small amount of right pleural
fluid.

Tubes and lines unchanged.
